# Patient Record
Sex: Female | Born: 1950 | Race: White | Hispanic: No | Smoking: Former smoker
Health system: Southern US, Community
[De-identification: ages and names within clinical notes are randomized; demographics above are authoritative.]

## PROBLEM LIST (undated history)

## (undated) DIAGNOSIS — Z9981 Dependence on supplemental oxygen: Secondary | ICD-10-CM

## (undated) DIAGNOSIS — G540 Brachial plexus disorders: Secondary | ICD-10-CM

## (undated) DIAGNOSIS — K219 Gastro-esophageal reflux disease without esophagitis: Secondary | ICD-10-CM

## (undated) DIAGNOSIS — Z8489 Family history of other specified conditions: Secondary | ICD-10-CM

## (undated) DIAGNOSIS — J439 Emphysema, unspecified: Secondary | ICD-10-CM

## (undated) DIAGNOSIS — I272 Pulmonary hypertension, unspecified: Secondary | ICD-10-CM

## (undated) DIAGNOSIS — N189 Chronic kidney disease, unspecified: Secondary | ICD-10-CM

## (undated) DIAGNOSIS — G8929 Other chronic pain: Secondary | ICD-10-CM

## (undated) DIAGNOSIS — I340 Nonrheumatic mitral (valve) insufficiency: Secondary | ICD-10-CM

## (undated) DIAGNOSIS — I509 Heart failure, unspecified: Secondary | ICD-10-CM

## (undated) DIAGNOSIS — I071 Rheumatic tricuspid insufficiency: Secondary | ICD-10-CM

## (undated) DIAGNOSIS — J9611 Chronic respiratory failure with hypoxia: Secondary | ICD-10-CM

## (undated) DIAGNOSIS — Z86711 Personal history of pulmonary embolism: Secondary | ICD-10-CM

## (undated) HISTORY — DX: Other chronic pain: G89.29

## (undated) HISTORY — PX: ABDOMINAL HYSTERECTOMY: SHX81

## (undated) HISTORY — DX: Emphysema, unspecified: J43.9

## (undated) HISTORY — DX: Chronic kidney disease, unspecified: N18.9

## (undated) HISTORY — DX: Pulmonary hypertension, unspecified: I27.20

## (undated) HISTORY — PX: THORACIC OUTLET SURGERY: SHX2502

## (undated) HISTORY — DX: Heart failure, unspecified: I50.9

## (undated) HISTORY — DX: Brachial plexus disorders: G54.0

## (undated) HISTORY — DX: Gastro-esophageal reflux disease without esophagitis: K21.9

---

## 2004-06-05 ENCOUNTER — Ambulatory Visit: Payer: Self-pay

## 2006-03-08 ENCOUNTER — Other Ambulatory Visit: Payer: Self-pay

## 2006-03-11 ENCOUNTER — Ambulatory Visit: Payer: Self-pay | Admitting: Unknown Physician Specialty

## 2006-03-22 ENCOUNTER — Observation Stay: Payer: Self-pay | Admitting: Unknown Physician Specialty

## 2006-07-04 ENCOUNTER — Ambulatory Visit: Payer: Self-pay | Admitting: Unknown Physician Specialty

## 2007-09-02 ENCOUNTER — Inpatient Hospital Stay: Payer: Self-pay | Admitting: Specialist

## 2008-09-21 ENCOUNTER — Ambulatory Visit: Payer: Self-pay | Admitting: Unknown Physician Specialty

## 2010-03-22 ENCOUNTER — Ambulatory Visit: Payer: Self-pay | Admitting: Unknown Physician Specialty

## 2011-04-30 ENCOUNTER — Ambulatory Visit: Payer: Self-pay | Admitting: Unknown Physician Specialty

## 2011-10-03 ENCOUNTER — Ambulatory Visit: Payer: Self-pay | Admitting: Unknown Physician Specialty

## 2011-10-10 ENCOUNTER — Ambulatory Visit: Payer: Self-pay | Admitting: Unknown Physician Specialty

## 2011-10-30 ENCOUNTER — Ambulatory Visit: Payer: Self-pay | Admitting: Gynecologic Oncology

## 2011-10-31 LAB — CA 125: CA 125: 37.1 U/mL — ABNORMAL HIGH (ref 0.0–34.0)

## 2011-11-26 ENCOUNTER — Ambulatory Visit: Payer: Self-pay | Admitting: Gynecologic Oncology

## 2011-12-04 ENCOUNTER — Ambulatory Visit: Payer: Self-pay | Admitting: Gynecologic Oncology

## 2011-12-04 LAB — CBC
HGB: 14.3 g/dL (ref 12.0–16.0)
MCH: 31.8 pg (ref 26.0–34.0)
Platelet: 169 10*3/uL (ref 150–440)
RBC: 4.5 10*6/uL (ref 3.80–5.20)
WBC: 6.6 10*3/uL (ref 3.6–11.0)

## 2011-12-04 LAB — BASIC METABOLIC PANEL
BUN: 18 mg/dL (ref 7–18)
Calcium, Total: 9.3 mg/dL (ref 8.5–10.1)
Glucose: 88 mg/dL (ref 65–99)
Sodium: 143 mmol/L (ref 136–145)

## 2011-12-11 ENCOUNTER — Ambulatory Visit: Payer: Self-pay | Admitting: Gynecologic Oncology

## 2011-12-27 ENCOUNTER — Ambulatory Visit: Payer: Self-pay | Admitting: Gynecologic Oncology

## 2012-02-12 ENCOUNTER — Ambulatory Visit: Payer: Self-pay | Admitting: Gynecologic Oncology

## 2012-02-26 ENCOUNTER — Ambulatory Visit: Payer: Self-pay | Admitting: Gynecologic Oncology

## 2012-05-01 ENCOUNTER — Ambulatory Visit: Payer: Self-pay | Admitting: Physician Assistant

## 2013-02-03 ENCOUNTER — Ambulatory Visit: Payer: Self-pay | Admitting: Anesthesiology

## 2013-03-06 ENCOUNTER — Ambulatory Visit: Payer: Self-pay | Admitting: Anesthesiology

## 2013-03-31 ENCOUNTER — Ambulatory Visit: Payer: Self-pay | Admitting: Anesthesiology

## 2013-04-02 ENCOUNTER — Ambulatory Visit: Payer: Self-pay | Admitting: Pain Medicine

## 2013-09-07 ENCOUNTER — Ambulatory Visit: Payer: Self-pay | Admitting: Anesthesiology

## 2013-11-05 ENCOUNTER — Ambulatory Visit: Payer: Self-pay | Admitting: Anesthesiology

## 2014-01-28 ENCOUNTER — Ambulatory Visit: Payer: Self-pay | Admitting: Anesthesiology

## 2014-05-04 ENCOUNTER — Ambulatory Visit: Payer: Self-pay | Admitting: Anesthesiology

## 2014-08-03 ENCOUNTER — Ambulatory Visit: Payer: Self-pay | Admitting: Anesthesiology

## 2014-09-07 ENCOUNTER — Ambulatory Visit
Admit: 2014-09-07 | Disposition: A | Discharge status: Home / Self Care | Payer: Self-pay | Attending: Physician Assistant | Admitting: Physician Assistant

## 2014-09-14 NOTE — Op Note (Signed)
PATIENT NAME:  Holly Werner, URIARTE MR#:  [id number] DATE OF BIRTH:  1950-10-19  DATE OF PROCEDURE:  12/11/2011  PREOPERATIVE DIAGNOSIS: Bilateral ovarian masses.   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   PROCEDURES PERFORMED: Laparoscopy for bilateral salpingo-oophorectomy, lysis of adhesions and removal of the mass from the cul-de-sac.   SURGEON: Weber Cooks, MD  ANESTHESIA: General.   COMPLICATIONS: None.   ESTIMATED BLOOD LOSS: 25 mL.   INDICATION FOR SURGERY: [name] is a 64 year old patient who was noted to have bilateral ovarian enlargement due to a solid tumor and on pelvic exam was noted to have a mass in the cul-de-sac of ovarian versus uterine origin therefore decision was made to proceed with laparoscopy.   FINDINGS AT TIME OF SURGERY: Normal inspection of the upper abdomen. Uterus small of normal form and size. Right ovary enlarged by a firm, hard tumor. Minimal adhesions. No papillations or excrescences. The left ovary is also enlarged in a similar fashion. In the cul-de-sac there is a 6 cm again solid, firm tumor with a thin adhesion to the pelvic sidewall and the sigmoid colon.   OPERATIVE REPORT: After adequate general anesthesia had been obtained, the patient was prepped and draped in lithotomy position. The cervix was visualized, grasped with a single-tooth tenaculum and dilated. A Hulka manipulator was inserted into the cervix and uterus. Then a Foley catheter was inserted.   Attention was then directed towards the abdomen. A small incision was placed in the umbilicus. The fascia was transected. The peritoneum was identified and entered. The blunt trocar was inserted. Pneumoperitoneum was obtained and inspection done with the above-mentioned findings. A small 5 mm trocar was placed in the upper left abdomen and another 5 mm trocar in the left lower abdomen.   Pelvic cytology was collected. Using the Harmonic scalpel the  pelvic sidewall was entered on the right side. Vessels and ureter were identified. The infundibulopelvic ligament was cauterized and transected. The adnexa with the tumor was then mobilized all the way towards the uterus. Tube and utero-ovarian ligament was cauterized and transected. Thus the right adnexa were completely mobilized. On the left side a similar procedure was done after adhesions around the sigmoid colon were lysed. Both ovaries were put in Endo Catch bags. Then the cul-de-sac tumor was freed from its thin adhesion to the bowel and pelvic sidewall and also placed in an Endo Catch bag. The 5 mm camera was then used to retrieve the specimens. The umbilical incision had to be enlarged slightly so that the specimen would fit through the opening. Thus both adnexa and the cul-de-sac tumor were completely removed. Inspection revealed adequate hemostasis in all areas. All trocars were then removed under direct vision. The pneumoperitoneum was expressed. The umbilical incision was closed with interrupted figure-of-eight stitches using 0 Vicryl. The subcutaneous tissue of all sites was reapproximated with 3-0 Vicryl and Dermabond was used to close the skin.   The patient tolerated the procedure well and was taken to recovery room in stable condition. Postoperative urine was clear. The catheter was removed.  Pad, sponge, needle, and instrument counts were correct x2.    ____________________________ Weber Cooks, MD bem:cms D: 12/11/2011 16:57:54 ET T: 12/11/2011 17:22:36 ET JOB#: [id number]  cc: Weber Cooks, MD, <Dictator>  Weber Cooks MD ELECTRONICALLY SIGNED 12/18/2011 17:49

## 2014-09-17 NOTE — H&P (Signed)
PATIENT NAME:  Holly Werner, Holly Werner MR#:  [id number] DATE OF BIRTH:  [birth year]  DATE OF ADMISSION:  02/03/2013  CHIEF COMPLAINT:  [name] presents for first evaluation.  She is a pleasant white female with a longstanding history of left trapezius muscle pain.  This has been present for about one month and gradually had gotten worse to a maximum VAS score of 10, best a 5, averaging about an 8 with no significant change in symptom onset.  She noted this when she picked up her grandchild.  She has some pain that seems to be worse in the afternoon and evening and after activity, aggravated by eating and movements of the left arm and head.  Alleviating factors include hot packs.  Cold seems to make this worse.  Sleeping and medication management seem to help.  She has been taking Vicodin 1 to 2 tablets 3 times a day as needed for pain relief.  She is describing the pain as burning, constant, annoying, dreadful.  It is primarily located in the posterior portion of the left shoulder with no problems with upper extremity strength or function affecting the left or right arm.  She does occasionally have some radiation of the pain into the left lateral upper extremity, but no numbness or tingling is noted.  The pain also does radiate into the left lateral neck periodically, but no neck rigidity or limitation of range of motion noted.   PAST MEDICAL HISTORY:  Significant for possible COPD and osteoarthritis.  Also significant for hyperlipidemia, nephrolithiasis, irritable bowel syndrome, reflux.  REVIEW OF SYSTEMS:  Negative for cardiovascular, neurologic, psychologic, gastrointestinal, genitourinary and hematologic.   SOCIAL HISTORY:  She is married with two children.  Smoked 1 pack per day starting at the age of 34.   CURRENT MEDICATIONS:  Valium quarter tablet as needed, acetaminophen hydrocodone 1 tablet orally q. 4 to 6 hours as needed for pain, Advil 200 mg one q. 4 hours and Robaxin as needed.   PAST SURGICAL  HISTORY:  Tonsillectomy and D and C.   PHYSICAL EXAMINATION:  GENERAL:  Reveals a pleasant white female in no acute distress.  She is alert and oriented x 3, cooperative and compliant.  HEENT:  Pupils are equally round, reactive to light.  Extraocular muscles intact.  HEART:  Regular rate and rhythm.  LUNGS:  Clear to auscultation.  VITAL SIGNS:  Blood pressure is 127/61, pulse 70, VAS 7 over 10, temperature is 97.7 with a sat of 97%.  TRAPEZIUS:  Inspection of the left trapezius reveals a trigger point in the midbody just above the scapula.  Palpation in this region does produce some radiation into the left splenius capitis region, but not to the left lateral arm.  Her strength in the upper extremities is good at 5 over 5 both proximal, distal to flexion, extension.  Sensation is intact.  Hand grasp is good.  No evidence of any deficits on examination noted.  No rashes noted.  Movement at the atlantooccipital joint is intact with no limitations except for some mild pain with extension and this pain does radiate into the left trapezius and left splenius capitis muscle.   ASSESSMENT: 1.  Myofascial left trapezius pain.  2.  Cervicalgia.  3.  Cervical facet arthropathy.   PLAN: 1.  We will perform a trigger point injection for her today.  2.  I have talked to her about physical therapy exercises and those are given to her today.  3.  Orthotic pillow.  4.  Return to clinic in 3 weeks for re-evaluation, possible repeat injection, consideration for MRI.   PROCEDURE:  The patient was placed in the seated position and the area overlying the trigger point was prepped with alcohol, injected with a 25-gauge needle.  A dose of 8 mL of ropivacaine 0.2% and 4 mg of Decadron were injected in a fanlike distribution.  This was tolerated without difficulty and the patient was convalesced and discharged to home in stable condition for follow-up as mentioned.    ____________________________ Alvina Filbert Andree Elk,  MD jga:ea D: 02/04/2013 16:50:16 ET T: 02/04/2013 17:02:09 ET JOB#: [id number]  cc: Alvina Filbert. Andree Elk, MD, <Dictator> Alvina Filbert. Andree Elk, MD Alvina Filbert Feras Gardella MD ELECTRONICALLY SIGNED 02/08/2013 11:33

## 2014-10-28 DIAGNOSIS — M542 Cervicalgia: Secondary | ICD-10-CM | POA: Insufficient documentation

## 2014-10-28 DIAGNOSIS — M7918 Myalgia, other site: Secondary | ICD-10-CM | POA: Insufficient documentation

## 2014-10-28 NOTE — Progress Notes (Signed)
Chief complaint is neck pain and shoulder pain  Procedure: Trigger Point Left Trapezius   History of present illness: Holly Werner continues to do reasonably  well with current medication management. Pain is of the same quality and characteric with  no  significant changes are  noted in baseline symptom complex. No change in lower extremity strength or function or bowel bladder function. Based on the  narcotic assessment sheet, the  patient continues to do well with this current regimen with no evidence of diverting or illicit use.  Strength is the bilateral upper extremities is without change.  There were no vitals taken for this visit.  No current outpatient prescriptions on file.  Patient Active Problem List   Diagnosis Date Noted  . Cervicalgia 10/28/2014  . Myofacial muscle pain 10/28/2014  . Neck pain 10/28/2014    Allergies not on file  Physical exam pupils are equally round and reactive to light  Extraocular muscles are intact   Heart is regular rate and rhythm and lower extremity strength and function remains a baseline with no significant changes noted.  Examination of the left trapezius region reveals 2 trigger points. He has some tenderness in the left cervical region with good range of motion and her upper chair strength is without deficit and sensation remains intact  Assessment #1 cervicalgia  #2 chronic opioid management  Plan: We'll refill medications at present with return to clinic in the next 2-3 months for reevaluation. Patient is to continue with physical therapy exercises and aerobic conditioning as tolerated and continue follow-up with her primary care physician We'll perform a trigger point injection today after mentioned 2 trigger points today and refill her medications today.  The after mentioned trigger points were each prepped with alcohol and then injected with a 25-gauge needle. I injected 5 cc of ropivacaine 0.2% with 5 mg of Decadron at each site  and this was well tolerated and she was convalesced discharged home in stable condition.  Dr. Vashti Hey 7:10 PM

## 2014-11-01 ENCOUNTER — Ambulatory Visit: Payer: Medicare Other | Attending: Anesthesiology | Admitting: Anesthesiology

## 2014-11-01 ENCOUNTER — Encounter: Payer: Self-pay | Admitting: Anesthesiology

## 2014-11-01 VITALS — BP 117/48 | HR 68 | Temp 97.7°F | Resp 18 | Ht 63.0 in | Wt 136.0 lb

## 2014-11-01 DIAGNOSIS — M791 Myalgia: Secondary | ICD-10-CM | POA: Insufficient documentation

## 2014-11-01 DIAGNOSIS — M542 Cervicalgia: Secondary | ICD-10-CM | POA: Diagnosis present

## 2014-11-01 DIAGNOSIS — F1190 Opioid use, unspecified, uncomplicated: Secondary | ICD-10-CM | POA: Insufficient documentation

## 2014-11-01 DIAGNOSIS — M25511 Pain in right shoulder: Secondary | ICD-10-CM | POA: Diagnosis present

## 2014-11-01 DIAGNOSIS — M7918 Myalgia, other site: Secondary | ICD-10-CM

## 2014-11-01 DIAGNOSIS — M25512 Pain in left shoulder: Secondary | ICD-10-CM | POA: Diagnosis present

## 2014-11-01 MED ORDER — HYDROCODONE-ACETAMINOPHEN 5-325 MG PO TABS: 1.0000 | ORAL_TABLET | Freq: Two times a day (BID) | ORAL | Status: DC

## 2014-11-01 MED ORDER — ROPIVACAINE HCL 2 MG/ML IJ SOLN
INTRAMUSCULAR | Status: AC
  Filled 2014-11-01: qty 10

## 2014-11-01 MED ORDER — DEXAMETHASONE SODIUM PHOSPHATE 10 MG/ML IJ SOLN
INTRAMUSCULAR | Status: AC
Start: 2014-11-01 — End: 2014-11-02
  Filled 2014-11-01: qty 1

## 2014-11-01 NOTE — Patient Instructions (Signed)

## 2014-11-01 NOTE — Progress Notes (Signed)
Discharged at 1510, ambulatory.

## 2014-11-02 ENCOUNTER — Telehealth: Payer: Self-pay | Admitting: *Deleted

## 2014-11-02 NOTE — Telephone Encounter (Signed)
F/u call done

## 2014-11-11 ENCOUNTER — Other Ambulatory Visit: Payer: Self-pay | Admitting: Anesthesiology

## 2015-01-10 ENCOUNTER — Ambulatory Visit: Payer: Medicare Other | Attending: Anesthesiology | Admitting: Anesthesiology

## 2015-01-10 ENCOUNTER — Encounter: Payer: Self-pay | Admitting: Anesthesiology

## 2015-01-10 VITALS — BP 118/50 | HR 69 | Temp 98.3°F | Resp 14 | Ht 63.0 in | Wt 133.0 lb

## 2015-01-10 DIAGNOSIS — M542 Cervicalgia: Secondary | ICD-10-CM | POA: Diagnosis present

## 2015-01-10 DIAGNOSIS — F1190 Opioid use, unspecified, uncomplicated: Secondary | ICD-10-CM | POA: Insufficient documentation

## 2015-01-10 DIAGNOSIS — M25519 Pain in unspecified shoulder: Secondary | ICD-10-CM | POA: Diagnosis present

## 2015-01-10 MED ORDER — HYDROCODONE-ACETAMINOPHEN 5-325 MG PO TABS: 1.0000 | ORAL_TABLET | Freq: Two times a day (BID) | ORAL | Status: DC

## 2015-01-10 NOTE — Patient Instructions (Signed)
A prescription for HYDROCODONE was given to you today.

## 2015-01-10 NOTE — Progress Notes (Signed)
Safety precautions to be maintained throughout the outpatient stay will include: orient to surroundings, keep bed in low position, maintain call bell within reach at all times, provide assistance with transfer out of bed and ambulation.  Discharged ambulatory at 2:50 pm

## 2015-01-11 NOTE — Progress Notes (Signed)
Chief complaint is neck pain and shoulder pain  History of present illness: Holly Werner continues to do reasonably  well with current medication management. Pain is of the same quality and characteric with  no  significant changes are  noted in baseline symptom complex. No change in lower extremity strength or function or bowel bladder function. Based on the  narcotic assessment sheet, the  patient continues to do well with this current regimen with no evidence of diverting or illicit use. She uses her medicine sparingly and generally no more than 1 or 2 Vicodin per day. She did very well with his last injection and he gave her almost 6 weeks of complete relief and only over the last 2 weeks that she had some recurrence of the same all the pain in the left trapezius region. No problems with upper extremity strength or function or numbness or tingling to the upper extremity is noted. She is doing some physical therapy exercises that seemed to help but do not completely alleviate pain and conservative therapy with physical therapy has been insufficient to get her complete relief  Strength is the bilateral upper extremities is without change.  BP 118/50 mmHg  Pulse 69  Temp(Src) 98.3 F (36.8 C) (Oral)  Resp 14  Ht 5\' 3"  (1.6 m)  Wt 133 lb (60.328 kg)  BMI 23.57 kg/m2  SpO2 96%   Current outpatient prescriptions:  .  docusate sodium (COLACE) 100 MG capsule, Take 100 mg by mouth daily., Disp: , Rfl:  .  HYDROcodone-acetaminophen (NORCO/VICODIN) 5-325 MG per tablet, Take 1 tablet by mouth 2 (two) times daily., Disp: 60 tablet, Rfl: 0 .  ibuprofen (ADVIL,MOTRIN) 200 MG tablet, Take 200 mg by mouth every 6 (six) hours as needed., Disp: , Rfl:  .  Methylcellulose, Laxative, (CITRUCEL PO), Take by mouth 2 (two) times daily., Disp: , Rfl:  .  pantoprazole (PROTONIX) 20 MG tablet, Take 20 mg by mouth daily., Disp: , Rfl:   Patient Active Problem List   Diagnosis Date Noted  . Cervicalgia 10/28/2014   . Myofacial muscle pain 10/28/2014  . Neck pain 10/28/2014    Allergies  Allergen Reactions  . Aspirin Other (See Comments)    Stomach hurt  . Codeine Other (See Comments)    stomach hurt    Physical exam pupils are equally round and reactive to light  Extraocular muscles are intact   Heart is regular rate and rhythm and lower extremity strength and function remains a baseline with no significant changes noted.  Examination of the left trapezius region reveals 2 trigger points. He has some tenderness in the left cervical region with good range of motion and her upper chair strength is without deficit and sensation remains intact  Assessment #1 cervicalgia  #2 chronic opioid management  Plan: We'll refill medications at present with return to clinic in the next 2-3 months for reevaluation. Patient is to continue with physical therapy exercises and aerobic conditioning as tolerated and continue follow-up with her primary care physician We'll perform a trigger point injection at her next visit in approximately 2-3 months. Dr. Vashti Hey 3:59 PM

## 2015-04-28 ENCOUNTER — Encounter: Payer: Self-pay | Admitting: Anesthesiology

## 2015-04-28 ENCOUNTER — Other Ambulatory Visit: Payer: Self-pay | Admitting: Anesthesiology

## 2015-04-28 ENCOUNTER — Ambulatory Visit: Payer: Medicare Other | Attending: Anesthesiology | Admitting: Anesthesiology

## 2015-04-28 VITALS — BP 127/54 | HR 71 | Temp 97.9°F | Resp 16 | Ht 63.0 in | Wt 136.0 lb

## 2015-04-28 DIAGNOSIS — M797 Fibromyalgia: Secondary | ICD-10-CM | POA: Diagnosis not present

## 2015-04-28 DIAGNOSIS — F1190 Opioid use, unspecified, uncomplicated: Secondary | ICD-10-CM | POA: Insufficient documentation

## 2015-04-28 DIAGNOSIS — M542 Cervicalgia: Secondary | ICD-10-CM | POA: Insufficient documentation

## 2015-04-28 DIAGNOSIS — M25519 Pain in unspecified shoulder: Secondary | ICD-10-CM | POA: Insufficient documentation

## 2015-04-28 DIAGNOSIS — M7918 Myalgia, other site: Secondary | ICD-10-CM

## 2015-04-28 MED ORDER — HYDROCODONE-ACETAMINOPHEN 5-325 MG PO TABS: 1.0000 | ORAL_TABLET | Freq: Two times a day (BID) | ORAL | Status: DC

## 2015-04-29 NOTE — Progress Notes (Signed)
Chief complaint is neck pain and shoulder pain  History of present illness: Holly Werner continues to do reasonably  well with current medication management. Pain is of the same quality and characteric with  no  significant changes are  noted in baseline symptom complex. She still has some intermittent severe pain in the left posterior trapezius muscle with radiation into the back of the shoulder blade and also affecting the posterior neck. She does not have any change in upper extremity strength or function and does not report any numbness or tingling. She is doing exercises as shown to me today for stretching and to assist with strengthening. She uses her Vicodin sparingly at about a half tablet up to 4 times a day.   Strength is the bilateral upper extremities is without change. She does have significant tenderness with compression of the left posterior trapezius which has been present on previous exam. Her strength but bicep tricep grip and wrist is symmetric and good rated at 5 over 5 throughout the upper extremities. Sensation is also intact to light touch and pulses are good and capillary refill is good as well.  BP 127/54 mmHg  Pulse 71  Temp(Src) 97.9 F (36.6 C) (Oral)  Resp 16  Ht 5\' 3"  (1.6 m)  Wt 136 lb (61.689 kg)  BMI 24.10 kg/m2  SpO2 95%   Current outpatient prescriptions:  .  docusate sodium (COLACE) 100 MG capsule, Take 100 mg by mouth daily., Disp: , Rfl:  .  HYDROcodone-acetaminophen (NORCO/VICODIN) 5-325 MG tablet, Take 1 tablet by mouth 2 (two) times daily., Disp: 60 tablet, Rfl: 0 .  ibuprofen (ADVIL,MOTRIN) 200 MG tablet, Take 200 mg by mouth every 6 (six) hours as needed., Disp: , Rfl:  .  Methylcellulose, Laxative, (CITRUCEL PO), Take by mouth 2 (two) times daily., Disp: , Rfl:  .  pantoprazole (PROTONIX) 20 MG tablet, Take 20 mg by mouth daily., Disp: , Rfl:   Patient Active Problem List   Diagnosis Date Noted  . Cervicalgia 10/28/2014  . Myofacial muscle pain  10/28/2014  . Neck pain 10/28/2014    Allergies  Allergen Reactions  . Aspirin Other (See Comments)    Stomach hurt  . Codeine Other (See Comments)    stomach hurt    Assessment #1 cervicalgia  #2 chronic opioid management At this point I think it is reasonable for her to continue on her current medication regimen. It seems to be working well for her. She has been compliant and I have encouraged her to reduce dosing to only 5 of 7 days per week. Also to add back and anti-inflammatory and/or muscle relaxant if needed to assist. I have cautioned her about potential side effects with long-term use of opioid medications and she voices understanding but states that nothing else seems to keep her pain under control. I do not believe further diagnostic studies are in order based on her physical examination. She is to return to clinic in 2 months. Also follow-up with her primary care physician for baseline medical management.   Dr. Vashti Hey 3:40 PM

## 2015-05-05 LAB — TOXASSURE SELECT 13 (MW), URINE

## 2015-06-22 ENCOUNTER — Encounter: Payer: Self-pay | Admitting: Anesthesiology

## 2015-06-22 ENCOUNTER — Ambulatory Visit: Payer: Medicare Other | Attending: Anesthesiology | Admitting: Anesthesiology

## 2015-06-22 VITALS — BP 135/60 | HR 69 | Temp 98.2°F | Resp 16 | Ht 63.0 in | Wt 134.0 lb

## 2015-06-22 DIAGNOSIS — M791 Myalgia: Secondary | ICD-10-CM | POA: Diagnosis not present

## 2015-06-22 DIAGNOSIS — M797 Fibromyalgia: Secondary | ICD-10-CM | POA: Diagnosis not present

## 2015-06-22 DIAGNOSIS — F1190 Opioid use, unspecified, uncomplicated: Secondary | ICD-10-CM | POA: Diagnosis not present

## 2015-06-22 DIAGNOSIS — M7918 Myalgia, other site: Secondary | ICD-10-CM

## 2015-06-22 DIAGNOSIS — M25519 Pain in unspecified shoulder: Secondary | ICD-10-CM | POA: Diagnosis present

## 2015-06-22 DIAGNOSIS — M542 Cervicalgia: Secondary | ICD-10-CM | POA: Insufficient documentation

## 2015-06-22 MED ORDER — HYDROCODONE-ACETAMINOPHEN 5-325 MG PO TABS
1.0000 | ORAL_TABLET | Freq: Two times a day (BID) | ORAL | Status: DC
Start: 2015-06-22 — End: 2015-08-18

## 2015-06-22 MED ORDER — HYDROCODONE-ACETAMINOPHEN 5-325 MG PO TABS: 1.0000 | ORAL_TABLET | Freq: Two times a day (BID) | ORAL | Status: DC

## 2015-06-22 NOTE — Patient Instructions (Signed)
You were given 2 prescriptions for Hydrocodone today. 

## 2015-06-22 NOTE — Progress Notes (Signed)
Safety precautions to be maintained throughout the outpatient stay will include: orient to surroundings, keep bed in low position, maintain call bell within reach at all times, provide assistance with transfer out of bed and ambulation.  

## 2015-06-23 NOTE — Progress Notes (Signed)
Chief complaint is neck pain and shoulder pain  History of present illness: Holly Werner is here for reevaluation today. She was seen approximately 2 months ago and continues to have significant pain involving the left posterior neck and left trapezius muscle. This occasionally radiates into some diffuse arm pain and pain radiating into all fingers of the left hand. She denies associated numbness and tingling at this time and reports no motor weakness affecting the left side. Despite efforts at stretching strengthening and physical therapy modalities she's failed to gain any significant improvement in this condition. She has had previous x-rays that show evidence of diffuse degenerative joint disease and some bony spurring involving C4-C5 and C6 on plain film x-ray. This condition has remained stable other than some recent exacerbation of the pain. She has had trigger point injections in the left trapezius muscle that have given her relief in the past..   Strength is the bilateral upper extremities is without change. She does have significant tenderness with compression of the left posterior trapezius which has been present on previous exam. Her strength but bicep tricep grip and wrist is symmetric and good rated at 5 over 5 throughout the upper extremities. Sensation is also intact to light touch and pulses are good and capillary refill is good as well.  BP 135/60 mmHg  Pulse 69  Temp(Src) 98.2 F (36.8 C) (Oral)  Resp 16  Ht 5\' 3"  (1.6 m)  Wt 134 lb (60.782 kg)  BMI 23.74 kg/m2  SpO2 93%   Current outpatient prescriptions:  .  diphenhydrAMINE (SOMINEX) 25 MG tablet, Take 25 mg by mouth at bedtime as needed for sleep., Disp: , Rfl:  .  docusate sodium (COLACE) 100 MG capsule, Take 100 mg by mouth daily., Disp: , Rfl:  .  HYDROcodone-acetaminophen (NORCO/VICODIN) 5-325 MG tablet, Take 1 tablet by mouth 2 (two) times daily., Disp: 60 tablet, Rfl: 0 .  ibuprofen (ADVIL,MOTRIN) 200 MG tablet, Take  200 mg by mouth every 6 (six) hours as needed., Disp: , Rfl:  .  pantoprazole (PROTONIX) 20 MG tablet, Take 20 mg by mouth daily., Disp: , Rfl:  .  Methylcellulose, Laxative, (CITRUCEL PO), Take by mouth 2 (two) times daily. Reported on 06/22/2015, Disp: , Rfl:   Patient Active Problem List   Diagnosis Date Noted  . Cervicalgia 10/28/2014  . Myofacial muscle pain 10/28/2014  . Neck pain 10/28/2014    Allergies  Allergen Reactions  . Aspirin Other (See Comments)    Stomach hurt  . Codeine Other (See Comments)    stomach hurt    Assessment #1 cervicalgia  #2 chronic opioid management At this point I think it is reasonable for her to continue on her current medication regimen. It seems to be working well for her. I have encouraged her to add in Naprosyn 220 mg tablets 1 or 2 tablets twice a day when necessary for pain and to use this 5 days of the week. We talked about potential risks associated with NSAIDs and hopefully she can reduce the amount of opioids she is taking. She has been compliant and I have encouraged her to reduce dosing to only 5 of 7 days per week. Also to add back a muscle relaxant if needed to assist. I have cautioned her about potential side effects with long-term use of opioid medications and she voices understanding but states that nothing else seems to keep her pain under control. I do not believe further diagnostic studies are in order based  on her physical examination. She is to return to clinic in 2 months. Also follow-up with her primary care physician for baseline medical management.   Dr. Vashti Hey 9:54 AM

## 2015-08-18 ENCOUNTER — Ambulatory Visit: Payer: Medicare Other | Attending: Anesthesiology | Admitting: Anesthesiology

## 2015-08-18 ENCOUNTER — Encounter: Payer: Self-pay | Admitting: Anesthesiology

## 2015-08-18 VITALS — BP 115/52 | HR 73 | Temp 97.8°F | Resp 18 | Ht 63.0 in | Wt 136.0 lb

## 2015-08-18 DIAGNOSIS — M542 Cervicalgia: Secondary | ICD-10-CM | POA: Diagnosis present

## 2015-08-18 DIAGNOSIS — M791 Myalgia: Secondary | ICD-10-CM | POA: Diagnosis not present

## 2015-08-18 DIAGNOSIS — M797 Fibromyalgia: Secondary | ICD-10-CM

## 2015-08-18 DIAGNOSIS — M778 Other enthesopathies, not elsewhere classified: Secondary | ICD-10-CM | POA: Diagnosis not present

## 2015-08-18 DIAGNOSIS — M25519 Pain in unspecified shoulder: Secondary | ICD-10-CM | POA: Diagnosis present

## 2015-08-18 DIAGNOSIS — M7918 Myalgia, other site: Secondary | ICD-10-CM

## 2015-08-18 DIAGNOSIS — F1120 Opioid dependence, uncomplicated: Secondary | ICD-10-CM | POA: Insufficient documentation

## 2015-08-18 MED ORDER — HYDROCODONE-ACETAMINOPHEN 5-325 MG PO TABS: 1.0000 | ORAL_TABLET | Freq: Two times a day (BID) | ORAL | Status: DC

## 2015-08-18 NOTE — Progress Notes (Signed)
Safety precautions to be maintained throughout the outpatient stay will include: orient to surroundings, keep bed in low position, maintain call bell within reach at all times, provide assistance with transfer out of bed and ambulation.  

## 2015-08-18 NOTE — Patient Instructions (Addendum)

## 2015-08-18 NOTE — Progress Notes (Signed)
Chief complaint is neck pain and shoulder pain  History of present illness: Holly Werner is here for reevaluation today. She was seen approximately 2 months ago and continues to have significant pain involving the left posterior neck and left trapezius muscle. This occasionally radiates into some diffuse arm pain and pain radiating into all fingers of the left hand. She denies associated numbness and tingling at this time and reports no motor weakness affecting the left side. Despite efforts at stretching strengthening and physical therapy modalities she's failed to gain any significant improvement in this condition. She has had previous x-rays that show evidence of diffuse degenerative joint disease and some bony spurring involving C4-C5 and C6 on plain film x-ray. She has been doing reasonably well with physical therapy stretching but continues to have breakthrough pain for which she takes hydrocodone 1-2 tablets a day. Based on her narcotic assessment sheet this continues to work well for her.  Physical exam:  Strength in the bilateral upper extremities is without change. She does have significant tenderness with compression of the left posterior trapezius which has been present on previous exam. Her strength but bicep tricep grip and wrist is symmetric and good rated at 5 over 5 throughout the upper extremities. Sensation is also intact to light touch and pulses are good and capillary refill is good as well.  BP 115/52 mmHg  Pulse 73  Temp(Src) 97.8 F (36.6 C) (Oral)  Resp 18  Ht 5\' 3"  (1.6 m)  Wt 136 lb (61.689 kg)  BMI 24.10 kg/m2  SpO2 91%   Current outpatient prescriptions:  .  docusate sodium (COLACE) 100 MG capsule, Take 100 mg by mouth daily., Disp: , Rfl:  .  HYDROcodone-acetaminophen (NORCO/VICODIN) 5-325 MG tablet, Take 1 tablet by mouth 2 (two) times daily., Disp: 60 tablet, Rfl: 0 .  ibuprofen (ADVIL,MOTRIN) 200 MG tablet, Take 200 mg by mouth every 6 (six) hours as needed.,  Disp: , Rfl:  .  pantoprazole (PROTONIX) 20 MG tablet, Take 20 mg by mouth daily., Disp: , Rfl:  .  diphenhydrAMINE (SOMINEX) 25 MG tablet, Take 25 mg by mouth at bedtime as needed for sleep. Reported on 08/18/2015, Disp: , Rfl:  .  Methylcellulose, Laxative, (CITRUCEL PO), Take by mouth 2 (two) times daily. Reported on 08/18/2015, Disp: , Rfl:   Patient Active Problem List   Diagnosis Date Noted  . Cervicalgia 10/28/2014  . Myofacial muscle pain 10/28/2014  . Neck pain 10/28/2014    Allergies  Allergen Reactions  . Aspirin Other (See Comments)    Stomach hurt  . Codeine Other (See Comments)    stomach hurt    Assessment #1 cervicalgia  #2 chronic opioid management At this point I think it is reasonable for her to continue on her current medication regimen. It seems to be working well for her. I have encouraged her to add in Naprosyn 220 mg tablets 1 or 2 tablets twice a day when necessary for pain and to use this 5 days of the week. We talked about potential risks associated with NSAIDs and hopefully she can reduce the amount of opioids she is taking. She has been compliant and I have encouraged her to reduce dosing to only 5 of 7 days per week. Also to add back a muscle relaxant if needed to assist. I have cautioned her about potential side effects with long-term use of opioid medications and she voices understanding but states that nothing else seems to keep her pain under control.  I do not believe further diagnostic studies are in order based on her physical examination. She is to return to clinic in 2 months in which we will schedule her for a trigger point injection.  Also follow-up with her primary care physician for baseline medical management.   Dr. Vashti Hey 3:44 PM

## 2015-08-26 LAB — TOXASSURE SELECT 13 (MW), URINE: PDF: 0

## 2015-10-13 ENCOUNTER — Encounter: Payer: Self-pay | Admitting: Anesthesiology

## 2015-10-13 ENCOUNTER — Ambulatory Visit: Payer: Medicare Other | Attending: Anesthesiology | Admitting: Anesthesiology

## 2015-10-13 VITALS — BP 117/60 | HR 65 | Temp 98.0°F | Resp 16 | Ht 63.0 in | Wt 134.0 lb

## 2015-10-13 DIAGNOSIS — M7918 Myalgia, other site: Secondary | ICD-10-CM

## 2015-10-13 DIAGNOSIS — M797 Fibromyalgia: Secondary | ICD-10-CM | POA: Diagnosis not present

## 2015-10-13 DIAGNOSIS — M542 Cervicalgia: Secondary | ICD-10-CM | POA: Insufficient documentation

## 2015-10-13 DIAGNOSIS — F1120 Opioid dependence, uncomplicated: Secondary | ICD-10-CM | POA: Insufficient documentation

## 2015-10-13 DIAGNOSIS — M791 Myalgia: Secondary | ICD-10-CM | POA: Insufficient documentation

## 2015-10-13 DIAGNOSIS — M25519 Pain in unspecified shoulder: Secondary | ICD-10-CM | POA: Diagnosis present

## 2015-10-13 DIAGNOSIS — M5030 Other cervical disc degeneration, unspecified cervical region: Secondary | ICD-10-CM | POA: Insufficient documentation

## 2015-10-13 MED ORDER — HYDROCODONE-ACETAMINOPHEN 5-325 MG PO TABS: 1.0000 | ORAL_TABLET | Freq: Two times a day (BID) | ORAL | Status: DC

## 2015-10-13 NOTE — Patient Instructions (Signed)
Pain Management Discharge Instructions  General Discharge Instructions :  If you need to reach your doctor call: Monday-Friday 8:00 am - 4:00 pm at [phone] or toll free [phone].  After clinic hours [phone] to have operator reach doctor.  Bring all of your medication bottles to all your appointments in the pain clinic.  To cancel or reschedule your appointment with Pain Management please remember to call 24 hours in advance to avoid a fee.  Refer to the educational materials which you have been given on: General Risks, I had my Procedure. Discharge Instructions, Post Sedation.  Post Procedure Instructions:  The drugs you were given will stay in your system until tomorrow, so for the next 24 hours you should not drive, make any legal decisions or drink any alcoholic beverages.  You may eat anything you prefer, but it is better to start with liquids then soups and crackers, and gradually work up to solid foods.  Please notify your doctor immediately if you have any unusual bleeding, trouble breathing or pain that is not related to your normal pain.  Depending on the type of procedure that was done, some parts of your body may feel week and/or numb.  This usually clears up by tonight or the next day.  Walk with the use of an assistive device or accompanied by an adult for the 24 hours.  You may use ice on the affected area for the first 24 hours.  Put ice in a Ziploc bag and cover with a towel and place against area 15 minutes on 15 minutes off.  You may switch to heat after 24 hours.Trigger Point Injection Trigger points are areas where you have muscle pain. A trigger point injection is a shot given in the trigger point to relieve that pain. A trigger point might feel like a knot in your muscle. It hurts to press on a trigger point. Sometimes the pain spreads out (radiates) to other parts of the body. For example, pressing on a trigger point in your shoulder might cause pain in  your arm or neck. You might have one trigger point. Or, you might have more than one. People often have trigger points in their upper back and lower back. They also occur often in the neck and shoulders. Pain from a trigger point lasts for a long time. It can make it hard to keep moving. You might not be able to do the exercise or physical therapy that could help you deal with the pain. A trigger point injection may help. It does not work for everyone. But, it may relieve your pain for a few days or a few months. A trigger point injection does not cure long-lasting (chronic) pain. LET YOUR CAREGIVER KNOW ABOUT:  Any allergies (especially to latex, lidocaine, or steroids).  Blood-thinning medicines that you take. These drugs can lead to bleeding or bruising after an injection. They include:  Aspirin.  Ibuprofen.  Clopidogrel.  Warfarin.  Other medicines you take. This includes all vitamins, herbs, eyedrops, over-the-counter medicines, and creams.  Use of steroids.  Recent infections.  Past problems with numbing medicines.  Bleeding problems.  Surgeries you have had.  Other health problems. RISKS AND COMPLICATIONS A trigger point injection is a safe treatment. However, problems may develop, such as:  Minor side effects usually go away in 1 to 2 days. These may include:  Soreness.  Bruising.  Stiffness.  More serious problems are rare. But, they may include:  Bleeding under the skin (hematoma).  Skin infection.  Breaking off of the needle under your skin.  Lung puncture.  The trigger point injection may not work for you. BEFORE THE PROCEDURE You may need to stop taking any medicine that thins your blood. This is to prevent bleeding and bruising. Usually these medicines are stopped several days before the injection. No other preparation is needed. PROCEDURE  A trigger point injection can be given in your caregiver's office or in a clinic. Each injection takes 2  minutes or less.  Your caregiver will feel for trigger points. The caregiver may use a marker to circle the area for the injection.  The skin over the trigger point will be washed with a germ-killing (antiseptic) solution.  The caregiver pinches the spot for the injection.  Then, a very thin needle is used for the shot. You may feel pain or a twitching feeling when the needle enters the trigger point.  A numbing solution may be injected into the trigger point. Sometimes a drug to keep down swelling, redness, and warmth (inflammation) is also injected.  Your caregiver moves the needle around the trigger zone until the tightness and twitching goes away.  After the injection, your caregiver may put gentle pressure over the injection site.  Then it is covered with a bandage. AFTER THE PROCEDURE  You can go right home after the injection.  The bandage can be taken off after a few hours.  You may feel sore and stiff for 1 to 2 days.  Go back to your regular activities slowly. Your caregiver may ask you to stretch your muscles. Do not do anything that takes extra energy for a few days.  Follow your caregiver's instructions to manage and treat other pain.   This information is not intended to replace advice given to you by your health care provider. Make sure you discuss any questions you have with your health care provider.   Document Released: 05/03/2011 Document Revised: 09/08/2012 Document Reviewed: 05/03/2011 Elsevier Interactive Patient Education 2016 [place]  What are the risk, side effects and possible complications? Generally speaking, most procedures are safe.  However, with any procedure there are risks, side effects, and the possibility of complications.  The risks and complications are dependent upon the sites that are lesioned, or the type of nerve block to be performed.  The closer the procedure is to the spine, the more serious the  risks are.  Great care is taken when placing the radio frequency needles, block needles or lesioning probes, but sometimes complications can occur.  Infection: Any time there is an injection through the skin, there is a risk of infection.  This is why sterile conditions are used for these blocks.  There are four possible types of infection.  Localized skin infection.  Central Nervous System Infection-This can be in the form of Meningitis, which can be deadly.  Epidural Infections-This can be in the form of an epidural abscess, which can cause pressure inside of the spine, causing compression of the spinal cord with subsequent paralysis. This would require an emergency surgery to decompress, and there are no guarantees that the patient would recover from the paralysis.  Discitis-This is an infection of the intervertebral discs.  It occurs in about 1% of discography procedures.  It is difficult to treat and it may lead to surgery.        2. Pain: the needles have to go through skin and soft tissues, will cause soreness.  3. Damage to internal structures:  The nerves to be lesioned may be near blood vessels or    other nerves which can be potentially damaged.       4. Bleeding: Bleeding is more common if the patient is taking blood thinners such as  aspirin, Coumadin, Ticiid, Plavix, etc., or if he/she have some genetic predisposition  such as hemophilia. Bleeding into the spinal canal can cause compression of the spinal  cord with subsequent paralysis.  This would require an emergency surgery to  decompress and there are no guarantees that the patient would recover from the  paralysis.       5. Pneumothorax:  Puncturing of a lung is a possibility, every time a needle is introduced in  the area of the chest or upper back.  Pneumothorax refers to free air around the  collapsed lung(s), inside of the thoracic cavity (chest cavity).  Another two possible  complications related to a similar event would  include: Hemothorax and Chylothorax.   These are variations of the Pneumothorax, where instead of air around the collapsed  lung(s), you may have blood or chyle, respectively.       6. Spinal headaches: They may occur with any procedures in the area of the spine.       7. Persistent CSF (Cerebro-Spinal Fluid) leakage: This is a rare problem, but may occur  with prolonged intrathecal or epidural catheters either due to the formation of a fistulous  track or a dural tear.       8. Nerve damage: By working so close to the spinal cord, there is always a possibility of  nerve damage, which could be as serious as a permanent spinal cord injury with  paralysis.       9. Death:  Although rare, severe deadly allergic reactions known as "Anaphylactic  reaction" can occur to any of the medications used.      10. Worsening of the symptoms:  We can always make thing worse.  What are the chances of something like this happening? Chances of any of this occuring are extremely low.  By statistics, you have more of a chance of getting killed in a motor vehicle accident: while driving to the hospital than any of the above occurring .  Nevertheless, you should be aware that they are possibilities.  In general, it is similar to taking a shower.  Everybody knows that you can slip, hit your head and get killed.  Does that mean that you should not shower again?  Nevertheless always keep in mind that statistics do not mean anything if you happen to be on the wrong side of them.  Even if a procedure has a 1 (one) in a 1,000,000 (million) chance of going wrong, it you happen to be that one..Also, keep in mind that by statistics, you have more of a chance of having something go wrong when taking medications.  Who should not have this procedure? If you are on a blood thinning medication (e.g. Coumadin, Plavix, see list of "Blood Thinners"), or if you have an active infection going on, you should not have the procedure.  If you are  taking any blood thinners, please inform your physician.  How should I prepare for this procedure?  Do not eat or drink anything at least six hours prior to the procedure.  Bring a driver with you .  It cannot be a taxi.  Come accompanied by an adult that can drive you back, and  that is strong enough to help you if your legs get weak or numb from the local anesthetic.  Take all of your medicines the morning of the procedure with just enough water to swallow them.  If you have diabetes, make sure that you are scheduled to have your procedure done first thing in the morning, whenever possible.  If you have diabetes, take only half of your insulin dose and notify our nurse that you have done so as soon as you arrive at the clinic.  If you are diabetic, but only take blood sugar pills (oral hypoglycemic), then do not take them on the morning of your procedure.  You may take them after you have had the procedure.  Do not take aspirin or any aspirin-containing medications, at least eleven (11) days prior to the procedure.  They may prolong bleeding.  Wear loose fitting clothing that may be easy to take off and that you would not mind if it got stained with Betadine or blood.  Do not wear any jewelry or perfume  Remove any nail coloring.  It will interfere with some of our monitoring equipment.  NOTE: Remember that this is not meant to be interpreted as a complete list of all possible complications.  Unforeseen problems may occur.  BLOOD THINNERS The following drugs contain aspirin or other products, which can cause increased bleeding during surgery and should not be taken for 2 weeks prior to and 1 week after surgery.  If you should need take something for relief of minor pain, you may take acetaminophen which is found in Tylenol,m Datril, Anacin-3 and Panadol. It is not blood thinner. The products listed below are.  Do not take any of the products listed below in addition to any listed on  your instruction sheet.  A.P.C or A.P.C with Codeine Codeine Phosphate Capsules #3 Ibuprofen Ridaura  ABC compound Congesprin Imuran rimadil  Advil Cope Indocin Robaxisal  Alka-Seltzer Effervescent Pain Reliever and Antacid Coricidin or Coricidin-D  Indomethacin Rufen  Alka-Seltzer plus Cold Medicine Cosprin Ketoprofen S-A-C Tablets  Anacin Analgesic Tablets or Capsules Coumadin Korlgesic Salflex  Anacin Extra Strength Analgesic tablets or capsules CP-2 Tablets Lanoril Salicylate  Anaprox Cuprimine Capsules Levenox Salocol  Anexsia-D Dalteparin Magan Salsalate  Anodynos Darvon compound Magnesium Salicylate Sine-off  Ansaid Dasin Capsules Magsal Sodium Salicylate  Anturane Depen Capsules Marnal Soma  APF Arthritis pain formula Dewitt's Pills Measurin Stanback  Argesic Dia-Gesic Meclofenamic Sulfinpyrazone  Arthritis Bayer Timed Release Aspirin Diclofenac Meclomen Sulindac  Arthritis pain formula Anacin Dicumarol Medipren Supac  Analgesic (Safety coated) Arthralgen Diffunasal Mefanamic Suprofen  Arthritis Strength Bufferin Dihydrocodeine Mepro Compound Suprol  Arthropan liquid Dopirydamole Methcarbomol with Aspirin Synalgos  ASA tablets/Enseals Disalcid Micrainin Tagament  Ascriptin Doan's Midol Talwin  Ascriptin A/D Dolene Mobidin Tanderil  Ascriptin Extra Strength Dolobid Moblgesic Ticlid  Ascriptin with Codeine Doloprin or Doloprin with Codeine Momentum Tolectin  Asperbuf Duoprin Mono-gesic Trendar  Aspergum Duradyne Motrin or Motrin IB Triminicin  Aspirin plain, buffered or enteric coated Durasal Myochrisine Trigesic  Aspirin Suppositories Easprin Nalfon Trillsate  Aspirin with Codeine Ecotrin Regular or Extra Strength Naprosyn Uracel  Atromid-S Efficin Naproxen Ursinus  Auranofin Capsules Elmiron Neocylate Vanquish  Axotal Emagrin Norgesic Verin  Azathioprine Empirin or Empirin with Codeine Normiflo Vitamin E  Azolid Emprazil Nuprin Voltaren  Bayer Aspirin plain, buffered or  children's or timed BC Tablets or powders Encaprin Orgaran Warfarin Sodium  Buff-a-Comp Enoxaparin Orudis Zorpin  Buff-a-Comp with Codeine Equegesic Os-Cal-Gesic   Buffaprin Excedrin plain, buffered  or Extra Strength Oxalid   Bufferin Arthritis Strength Feldene Oxphenbutazone   Bufferin plain or Extra Strength Feldene Capsules Oxycodone with Aspirin   Bufferin with Codeine Fenoprofen Fenoprofen Pabalate or Pabalate-SF   Buffets II Flogesic Panagesic   Buffinol plain or Extra Strength Florinal or Florinal with Codeine Panwarfarin   Buf-Tabs Flurbiprofen Penicillamine   Butalbital Compound Four-way cold tablets Penicillin   Butazolidin Fragmin Pepto-Bismol   Carbenicillin Geminisyn Percodan   Carna Arthritis Reliever Geopen Persantine   Carprofen Gold's salt Persistin   Chloramphenicol Goody's Phenylbutazone   Chloromycetin Haltrain Piroxlcam   Clmetidine heparin Plaquenil   Cllnoril Hyco-pap Ponstel   Clofibrate Hydroxy chloroquine Propoxyphen         Before stopping any of these medications, be sure to consult the physician who ordered them.  Some, such as Coumadin (Warfarin) are ordered to prevent or treat serious conditions such as "deep thrombosis", "pumonary embolisms", and other heart problems.  The amount of time that you may need off of the medication may also vary with the medication and the reason for which you were taking it.  If you are taking any of these medications, please make sure you notify your pain physician before you undergo any procedures.

## 2015-10-13 NOTE — Progress Notes (Signed)
Safety precautions to be maintained throughout the outpatient stay will include: orient to surroundings, keep bed in low position, maintain call bell within reach at all times, provide assistance with transfer out of bed and ambulation.  

## 2015-10-17 NOTE — Progress Notes (Signed)
Chief complaint is neck pain and shoulder pain  History of present illness: Holly Werner is here for reevaluation today. She was seen around 2 months ago and continues to have significant pain involving the left posterior neck and left trapezius muscle. This occasionally radiates into some diffuse arm pain and pain radiating into all fingers of the left hand. She denies associated numbness and tingling at this time and reports no motor weakness affecting the left side. Despite efforts at stretching strengthening and physical therapy modalities she's failed to gain any significant improvement in this condition. She has had previous x-rays that show evidence of diffuse degenerative joint disease and some bony spurring involving C4-C5 and C6 on plain film x-ray.   Today she reports that she has been doing reasonably well with physical therapy stretching but continues to have breakthrough pain for which she takes hydrocodone 1-2 tablets a day. Based on her narcotic assessment sheet this continues to work well for her.  Physical exam:  Strength in the bilateral upper extremities is without change. She does have significant tenderness with compression of the left posterior trapezius which has been present on previous exam. Her strength but bicep tricep grip and wrist is symmetric and good rated at 5 over 5 throughout the upper extremities. Sensation is also intact to light touch and pulses are good and capillary refill is good as well.  BP 117/60 mmHg  Pulse 65  Temp(Src) 98 F (36.7 C) (Oral)  Resp 16  Ht 5\' 3"  (1.6 m)  Wt 134 lb (60.782 kg)  BMI 23.74 kg/m2  SpO2 97%   Current outpatient prescriptions:  .  docusate sodium (COLACE) 100 MG capsule, Take 100 mg by mouth daily., Disp: , Rfl:  .  HYDROcodone-acetaminophen (NORCO/VICODIN) 5-325 MG tablet, Take 1 tablet by mouth 2 (two) times daily., Disp: 60 tablet, Rfl: 0 .  ibuprofen (ADVIL,MOTRIN) 200 MG tablet, Take 200 mg by mouth every 6 (six)  hours as needed., Disp: , Rfl:  .  Methylcellulose, Laxative, (CITRUCEL PO), Take by mouth 2 (two) times daily. Reported on 08/18/2015, Disp: , Rfl:  .  pantoprazole (PROTONIX) 20 MG tablet, Take 20 mg by mouth daily., Disp: , Rfl:  .  diphenhydrAMINE (SOMINEX) 25 MG tablet, Take 25 mg by mouth at bedtime as needed for sleep. Reported on 10/13/2015, Disp: , Rfl:   Patient Active Problem List   Diagnosis Date Noted  . Cervicalgia 10/28/2014  . Myofacial muscle pain 10/28/2014  . Neck pain 10/28/2014    Allergies  Allergen Reactions  . Aspirin Other (See Comments)    Stomach hurt  . Codeine Other (See Comments)    stomach hurt    Assessment #1 cervicalgia  #2 chronic opioid management At this point I think it is reasonable for her to continue on her current medication regimen. It seems to be working well for her. I have encouraged her to add in Naprosyn 220 mg tablets 1 or 2 tablets twice a day when necessary for pain and to use this 5 days of the week. We talked about potential risks associated with NSAIDs and hopefully she can reduce the amount of opioids she is taking. She has been compliant and I have encouraged her to reduce dosing to only 5 of 7 days per week. Also to add back a muscle relaxant if needed to assist. I have cautioned her about potential side effects with long-term use of opioid medications and she voices understanding but states that nothing else seems  to keep her pain under control. I do not believe further diagnostic studies are in order based on her physical examination. She is to return to clinic in 2 months in which we will schedule her for a trigger point injection.  Also follow-up with her primary care physician for baseline medical management.   Dr. Vashti Hey 9:25 AM

## 2015-12-13 ENCOUNTER — Ambulatory Visit: Payer: Medicare Other | Attending: Anesthesiology | Admitting: Anesthesiology

## 2015-12-13 ENCOUNTER — Encounter: Payer: Self-pay | Admitting: Anesthesiology

## 2015-12-13 VITALS — BP 111/47 | HR 72 | Temp 98.2°F | Resp 18 | Ht 63.0 in | Wt 130.0 lb

## 2015-12-13 DIAGNOSIS — M5030 Other cervical disc degeneration, unspecified cervical region: Secondary | ICD-10-CM | POA: Diagnosis not present

## 2015-12-13 DIAGNOSIS — Z79891 Long term (current) use of opiate analgesic: Secondary | ICD-10-CM | POA: Diagnosis not present

## 2015-12-13 DIAGNOSIS — M797 Fibromyalgia: Secondary | ICD-10-CM

## 2015-12-13 DIAGNOSIS — M25519 Pain in unspecified shoulder: Secondary | ICD-10-CM | POA: Diagnosis present

## 2015-12-13 DIAGNOSIS — M778 Other enthesopathies, not elsewhere classified: Secondary | ICD-10-CM | POA: Diagnosis not present

## 2015-12-13 DIAGNOSIS — M7918 Myalgia, other site: Secondary | ICD-10-CM

## 2015-12-13 DIAGNOSIS — M542 Cervicalgia: Secondary | ICD-10-CM

## 2015-12-13 MED ORDER — HYDROCODONE-ACETAMINOPHEN 5-325 MG PO TABS: 1.0000 | ORAL_TABLET | Freq: Two times a day (BID) | ORAL | Status: DC

## 2015-12-13 MED ORDER — CELECOXIB 200 MG PO CAPS: 200.0000 mg | ORAL_CAPSULE | Freq: Two times a day (BID) | ORAL | Status: DC

## 2015-12-13 NOTE — Patient Instructions (Signed)
Trigger Point Injection Trigger points are areas where you have muscle pain. A trigger point injection is a shot given in the trigger point to relieve that pain. A trigger point might feel like a knot in your muscle. It hurts to press on a trigger point. Sometimes the pain spreads out (radiates) to other parts of the body. For example, pressing on a trigger point in your shoulder might cause pain in your arm or neck. You might have one trigger point. Or, you might have more than one. People often have trigger points in their upper back and lower back. They also occur often in the neck and shoulders. Pain from a trigger point lasts for a long time. It can make it hard to keep moving. You might not be able to do the exercise or physical therapy that could help you deal with the pain. A trigger point injection may help. It does not work for everyone. But, it may relieve your pain for a few days or a few months. A trigger point injection does not cure long-lasting (chronic) pain. LET YOUR CAREGIVER KNOW ABOUT:  Any allergies (especially to latex, lidocaine, or steroids).  Blood-thinning medicines that you take. These drugs can lead to bleeding or bruising after an injection. They include:  Aspirin.  Ibuprofen.  Clopidogrel.  Warfarin.  Other medicines you take. This includes all vitamins, herbs, eyedrops, over-the-counter medicines, and creams.  Use of steroids.  Recent infections.  Past problems with numbing medicines.  Bleeding problems.  Surgeries you have had.  Other health problems. RISKS AND COMPLICATIONS A trigger point injection is a safe treatment. However, problems may develop, such as:  Minor side effects usually go away in 1 to 2 days. These may include:  Soreness.  Bruising.  Stiffness.  More serious problems are rare. But, they may include:  Bleeding under the skin (hematoma).  Skin infection.  Breaking off of the needle under your skin.  Lung  puncture.  The trigger point injection may not work for you. BEFORE THE PROCEDURE You may need to stop taking any medicine that thins your blood. This is to prevent bleeding and bruising. Usually these medicines are stopped several days before the injection. No other preparation is needed. PROCEDURE  A trigger point injection can be given in your caregiver's office or in a clinic. Each injection takes 2 minutes or less.  Your caregiver will feel for trigger points. The caregiver may use a marker to circle the area for the injection.  The skin over the trigger point will be washed with a germ-killing (antiseptic) solution.  The caregiver pinches the spot for the injection.  Then, a very thin needle is used for the shot. You may feel pain or a twitching feeling when the needle enters the trigger point.  A numbing solution may be injected into the trigger point. Sometimes a drug to keep down swelling, redness, and warmth (inflammation) is also injected.  Your caregiver moves the needle around the trigger zone until the tightness and twitching goes away.  After the injection, your caregiver may put gentle pressure over the injection site.  Then it is covered with a bandage. AFTER THE PROCEDURE  You can go right home after the injection.  The bandage can be taken off after a few hours.  You may feel sore and stiff for 1 to 2 days.  Go back to your regular activities slowly. Your caregiver may ask you to stretch your muscles. Do not do anything that takes   extra energy for a few days.  Follow your caregiver's instructions to manage and treat other pain.   This information is not intended to replace advice given to you by your health care provider. Make sure you discuss any questions you have with your health care provider.   Document Released: 05/03/2011 Document Revised: 09/08/2012 Document Reviewed: 05/03/2011 Elsevier Interactive Patient Education 2016 [place]  What are the risk, side effects and possible complications? Generally speaking, most procedures are safe.  However, with any procedure there are risks, side effects, and the possibility of complications.  The risks and complications are dependent upon the sites that are lesioned, or the type of nerve block to be performed.  The closer the procedure is to the spine, the more serious the risks are.  Great care is taken when placing the radio frequency needles, block needles or lesioning probes, but sometimes complications can occur.  Infection: Any time there is an injection through the skin, there is a risk of infection.  This is why sterile conditions are used for these blocks.  There are four possible types of infection.  Localized skin infection.  Central Nervous System Infection-This can be in the form of Meningitis, which can be deadly.  Epidural Infections-This can be in the form of an epidural abscess, which can cause pressure inside of the spine, causing compression of the spinal cord with subsequent paralysis. This would require an emergency surgery to decompress, and there are no guarantees that the patient would recover from the paralysis.  Discitis-This is an infection of the intervertebral discs.  It occurs in about 1% of discography procedures.  It is difficult to treat and it may lead to surgery.        2. Pain: the needles have to go through skin and soft tissues, will cause soreness.       3. Damage to internal structures:  The nerves to be lesioned may be near blood vessels or    other nerves which can be potentially damaged.       4. Bleeding: Bleeding is more common if the patient is taking blood thinners such as  aspirin, Coumadin, Ticiid, Plavix, etc., or if he/she have some genetic predisposition  such as hemophilia. Bleeding into the spinal canal can cause compression of the spinal  cord with subsequent paralysis.  This would require an emergency surgery to   decompress and there are no guarantees that the patient would recover from the  paralysis.       5. Pneumothorax:  Puncturing of a lung is a possibility, every time a needle is introduced in  the area of the chest or upper back.  Pneumothorax refers to free air around the  collapsed lung(s), inside of the thoracic cavity (chest cavity).  Another two possible  complications related to a similar event would include: Hemothorax and Chylothorax.   These are variations of the Pneumothorax, where instead of air around the collapsed  lung(s), you may have blood or chyle, respectively.       6. Spinal headaches: They may occur with any procedures in the area of the spine.       7. Persistent CSF (Cerebro-Spinal Fluid) leakage: This is a rare problem, but may occur  with prolonged intrathecal or epidural catheters either due to the formation of a fistulous  track or a dural tear.       8. Nerve damage: By working so close to the spinal cord, there  is always a possibility of  nerve damage, which could be as serious as a permanent spinal cord injury with  paralysis.       9. Death:  Although rare, severe deadly allergic reactions known as "Anaphylactic  reaction" can occur to any of the medications used.      10. Worsening of the symptoms:  We can always make thing worse.  What are the chances of something like this happening? Chances of any of this occuring are extremely low.  By statistics, you have more of a chance of getting killed in a motor vehicle accident: while driving to the hospital than any of the above occurring .  Nevertheless, you should be aware that they are possibilities.  In general, it is similar to taking a shower.  Everybody knows that you can slip, hit your head and get killed.  Does that mean that you should not shower again?  Nevertheless always keep in mind that statistics do not mean anything if you happen to be on the wrong side of them.  Even if a procedure has a 1 (one) in a 1,000,000  (million) chance of going wrong, it you happen to be that one..Also, keep in mind that by statistics, you have more of a chance of having something go wrong when taking medications.  Who should not have this procedure? If you are on a blood thinning medication (e.g. Coumadin, Plavix, see list of "Blood Thinners"), or if you have an active infection going on, you should not have the procedure.  If you are taking any blood thinners, please inform your physician.  How should I prepare for this procedure?  Do not eat or drink anything at least six hours prior to the procedure.  Bring a driver with you .  It cannot be a taxi.  Come accompanied by an adult that can drive you back, and that is strong enough to help you if your legs get weak or numb from the local anesthetic.  Take all of your medicines the morning of the procedure with just enough water to swallow them.  If you have diabetes, make sure that you are scheduled to have your procedure done first thing in the morning, whenever possible.  If you have diabetes, take only half of your insulin dose and notify our nurse that you have done so as soon as you arrive at the clinic.  If you are diabetic, but only take blood sugar pills (oral hypoglycemic), then do not take them on the morning of your procedure.  You may take them after you have had the procedure.  Do not take aspirin or any aspirin-containing medications, at least eleven (11) days prior to the procedure.  They may prolong bleeding.  Wear loose fitting clothing that may be easy to take off and that you would not mind if it got stained with Betadine or blood.  Do not wear any jewelry or perfume  Remove any nail coloring.  It will interfere with some of our monitoring equipment.  NOTE: Remember that this is not meant to be interpreted as a complete list of all possible complications.  Unforeseen problems may occur.  BLOOD THINNERS The following drugs contain aspirin or other  products, which can cause increased bleeding during surgery and should not be taken for 2 weeks prior to and 1 week after surgery.  If you should need take something for relief of minor pain, you may take acetaminophen which is found in Tylenol,m Datril, Anacin-3  and Panadol. It is not blood thinner. The products listed below are.  Do not take any of the products listed below in addition to any listed on your instruction sheet.  A.P.C or A.P.C with Codeine Codeine Phosphate Capsules #3 Ibuprofen Ridaura  ABC compound Congesprin Imuran rimadil  Advil Cope Indocin Robaxisal  Alka-Seltzer Effervescent Pain Reliever and Antacid Coricidin or Coricidin-D  Indomethacin Rufen  Alka-Seltzer plus Cold Medicine Cosprin Ketoprofen S-A-C Tablets  Anacin Analgesic Tablets or Capsules Coumadin Korlgesic Salflex  Anacin Extra Strength Analgesic tablets or capsules CP-2 Tablets Lanoril Salicylate  Anaprox Cuprimine Capsules Levenox Salocol  Anexsia-D Dalteparin Magan Salsalate  Anodynos Darvon compound Magnesium Salicylate Sine-off  Ansaid Dasin Capsules Magsal Sodium Salicylate  Anturane Depen Capsules Marnal Soma  APF Arthritis pain formula Dewitt's Pills Measurin Stanback  Argesic Dia-Gesic Meclofenamic Sulfinpyrazone  Arthritis Bayer Timed Release Aspirin Diclofenac Meclomen Sulindac  Arthritis pain formula Anacin Dicumarol Medipren Supac  Analgesic (Safety coated) Arthralgen Diffunasal Mefanamic Suprofen  Arthritis Strength Bufferin Dihydrocodeine Mepro Compound Suprol  Arthropan liquid Dopirydamole Methcarbomol with Aspirin Synalgos  ASA tablets/Enseals Disalcid Micrainin Tagament  Ascriptin Doan's Midol Talwin  Ascriptin A/D Dolene Mobidin Tanderil  Ascriptin Extra Strength Dolobid Moblgesic Ticlid  Ascriptin with Codeine Doloprin or Doloprin with Codeine Momentum Tolectin  Asperbuf Duoprin Mono-gesic Trendar  Aspergum Duradyne Motrin or Motrin IB Triminicin  Aspirin plain, buffered or enteric  coated Durasal Myochrisine Trigesic  Aspirin Suppositories Easprin Nalfon Trillsate  Aspirin with Codeine Ecotrin Regular or Extra Strength Naprosyn Uracel  Atromid-S Efficin Naproxen Ursinus  Auranofin Capsules Elmiron Neocylate Vanquish  Axotal Emagrin Norgesic Verin  Azathioprine Empirin or Empirin with Codeine Normiflo Vitamin E  Azolid Emprazil Nuprin Voltaren  Bayer Aspirin plain, buffered or children's or timed BC Tablets or powders Encaprin Orgaran Warfarin Sodium  Buff-a-Comp Enoxaparin Orudis Zorpin  Buff-a-Comp with Codeine Equegesic Os-Cal-Gesic   Buffaprin Excedrin plain, buffered or Extra Strength Oxalid   Bufferin Arthritis Strength Feldene Oxphenbutazone   Bufferin plain or Extra Strength Feldene Capsules Oxycodone with Aspirin   Bufferin with Codeine Fenoprofen Fenoprofen Pabalate or Pabalate-SF   Buffets II Flogesic Panagesic   Buffinol plain or Extra Strength Florinal or Florinal with Codeine Panwarfarin   Buf-Tabs Flurbiprofen Penicillamine   Butalbital Compound Four-way cold tablets Penicillin   Butazolidin Fragmin Pepto-Bismol   Carbenicillin Geminisyn Percodan   Carna Arthritis Reliever Geopen Persantine   Carprofen Gold's salt Persistin   Chloramphenicol Goody's Phenylbutazone   Chloromycetin Haltrain Piroxlcam   Clmetidine heparin Plaquenil   Cllnoril Hyco-pap Ponstel   Clofibrate Hydroxy chloroquine Propoxyphen         Before stopping any of these medications, be sure to consult the physician who ordered them.  Some, such as Coumadin (Warfarin) are ordered to prevent or treat serious conditions such as "deep thrombosis", "pumonary embolisms", and other heart problems.  The amount of time that you may need off of the medication may also vary with the medication and the reason for which you were taking it.  If you are taking any of these medications, please make sure you notify your pain physician before you undergo any procedures.

## 2015-12-13 NOTE — Progress Notes (Signed)
Safety precautions to be maintained throughout the outpatient stay will include: orient to surroundings, keep bed in low position, maintain call bell within reach at all times, provide assistance with transfer out of bed and ambulation.  

## 2015-12-14 NOTE — Progress Notes (Signed)
Chief complaint is neck pain and shoulder pain  History of present illness: Holly Werner is here for reevaluation today. She was seen around 2 months ago and continues to have significant pain involving the left posterior neck and left trapezius muscle. This occasionally radiates into some diffuse arm pain and pain radiating into all fingers of the left hand. She denies associated numbness and tingling at this time and reports no motor weakness affecting the left side. Despite efforts at stretching strengthening and physical therapy modalities she's failed to gain any significant improvement in this condition. She has had previous x-rays that show evidence of diffuse degenerative joint disease and some bony spurring involving C4-C5 and C6 on plain film x-ray.   Today she reports that she has been doing reasonably well with physical therapy stretching but continues to have breakthrough pain for which she takes hydrocodone 1-2 tablets a day. Based on her narcotic assessment sheet this continues to work well for her.  Physical exam:  Strength in the bilateral upper extremities is without change. She does have significant tenderness with compression of the left posterior trapezius which is unchanged and has been present on previous exam. Her strength but bicep tricep grip and wrist is symmetric and good rated at 5 over 5 throughout the upper extremities. Sensation is also intact to light touch and pulses are good and capillary refill is good as well.  BP 111/47 mmHg  Pulse 72  Temp(Src) 98.2 F (36.8 C)  Resp 18  Ht 5\' 3"  (1.6 m)  Wt 130 lb (58.968 kg)  BMI 23.03 kg/m2  SpO2 95%   Current outpatient prescriptions:  .  diazepam (VALIUM) 5 MG tablet, Take 2.5 mg by mouth daily as needed for anxiety., Disp: , Rfl:  .  HYDROcodone-acetaminophen (NORCO/VICODIN) 5-325 MG tablet, Take 1 tablet by mouth 2 (two) times daily., Disp: 45 tablet, Rfl: 0 .  pantoprazole (PROTONIX) 20 MG tablet, Take 20 mg by  mouth daily., Disp: , Rfl:  .  celecoxib (CELEBREX) 200 MG capsule, Take 1 capsule (200 mg total) by mouth 2 (two) times daily., Disp: 60 capsule, Rfl: 2 .  diphenhydrAMINE (SOMINEX) 25 MG tablet, Take 25 mg by mouth at bedtime as needed for sleep. Reported on 12/13/2015, Disp: , Rfl:  .  docusate sodium (COLACE) 100 MG capsule, Take 100 mg by mouth daily. Reported on 12/13/2015, Disp: , Rfl:  .  ibuprofen (ADVIL,MOTRIN) 200 MG tablet, Take 200 mg by mouth every 6 (six) hours as needed. Reported on 12/13/2015, Disp: , Rfl:  .  Methylcellulose, Laxative, (CITRUCEL PO), Take by mouth 2 (two) times daily. Reported on 12/13/2015, Disp: , Rfl:   Patient Active Problem List   Diagnosis Date Noted  . Cervicalgia 10/28/2014  . Myofacial muscle pain 10/28/2014  . Neck pain 10/28/2014    Allergies  Allergen Reactions  . Aspirin Other (See Comments)    Stomach hurt  . Codeine Other (See Comments)    stomach hurt    Assessment #1 cervicalgia  #2 chronic opioid management At this point I think it is reasonable for her to continue on her current medication regimen But we are going to reduce her Vicodin to 45 tablets in the month. That way she can continue with 1 or 2 tablets on a daily basis for the next 2 months. Ultimately as discussed our objective is to wean this down to no more than 15-30 tablets on a monthly basis for her bad days. I will also give  her a prescription for Celebrex 200 mg tablets to be taken 1 twice a day when necessary secondary to some of the gastritis complaints that she mentions. She is to continue with the physical therapy regimens as reviewed today.  Dr. Vashti Hey 5:05 PM

## 2016-02-20 ENCOUNTER — Encounter: Payer: Self-pay | Admitting: Anesthesiology

## 2016-02-20 ENCOUNTER — Ambulatory Visit: Payer: Medicare Other | Attending: Anesthesiology | Admitting: Anesthesiology

## 2016-02-20 VITALS — BP 107/61 | HR 72 | Temp 98.0°F | Resp 16 | Ht 63.0 in | Wt 132.0 lb

## 2016-02-20 DIAGNOSIS — Z885 Allergy status to narcotic agent status: Secondary | ICD-10-CM | POA: Insufficient documentation

## 2016-02-20 DIAGNOSIS — M791 Myalgia: Secondary | ICD-10-CM | POA: Diagnosis not present

## 2016-02-20 DIAGNOSIS — Z886 Allergy status to analgesic agent status: Secondary | ICD-10-CM | POA: Diagnosis not present

## 2016-02-20 DIAGNOSIS — M25512 Pain in left shoulder: Secondary | ICD-10-CM | POA: Diagnosis not present

## 2016-02-20 DIAGNOSIS — Z79891 Long term (current) use of opiate analgesic: Secondary | ICD-10-CM | POA: Diagnosis not present

## 2016-02-20 DIAGNOSIS — M797 Fibromyalgia: Secondary | ICD-10-CM

## 2016-02-20 DIAGNOSIS — G8929 Other chronic pain: Secondary | ICD-10-CM | POA: Insufficient documentation

## 2016-02-20 DIAGNOSIS — M542 Cervicalgia: Secondary | ICD-10-CM | POA: Diagnosis not present

## 2016-02-20 DIAGNOSIS — M7918 Myalgia, other site: Secondary | ICD-10-CM

## 2016-02-20 MED ORDER — HYDROCODONE-ACETAMINOPHEN 5-325 MG PO TABS: 1.0000 | ORAL_TABLET | Freq: Two times a day (BID) | ORAL | 0 refills | Status: DC

## 2016-02-21 NOTE — Progress Notes (Signed)
Chief complaint is neck pain and shoulder pain  History of present illness: Holly Werner is here for reevaluation today. She was seen around 2 months ago and continues to have significant pain involving the left posterior neck and left trapezius muscle. This occasionally radiates into some diffuse arm pain and pain radiating into all fingers of the left hand. She denies associated numbness and tingling at this time and reports no motor weakness affecting the left side. Despite efforts at stretching strengthening and physical therapy modalities she's failed to gain any significant improvement in this condition. She has had previous x-rays that show evidence of diffuse degenerative joint disease and some bony spurring involving C4-C5 and C6 on plain film x-ray.   She is tolerating the reduction to 45 tablets per month on the Vicodin. This keeps her pain under reasonable control. No change in her symptom complex is noted but her pain continues to be quite recalcitrant. She has had difficulty doing the physical therapy exercises and has not gained much improvement in pain control with them. Otherwise she is in her usual state of health today.   Physical exam:  Strength in the bilateral upper extremities is without change. She does have significant tenderness with compression of the left posterior trapezius which is unchanged and has been present on previous exam. Her strength but bicep tricep grip and wrist is symmetric and good rated at 5 over 5 throughout the upper extremities. Sensation is also intact to light touch and pulses are good and capillary refill is good as well.  BP 107/61   Pulse 72   Temp 98 F (36.7 C)   Resp 16   Ht 5\' 3"  (1.6 m)   Wt 132 lb (59.9 kg)   SpO2 99%   BMI 23.38 kg/m    Current Outpatient Prescriptions:  .  diazepam (VALIUM) 5 MG tablet, Take 2.5 mg by mouth daily as needed for anxiety., Disp: , Rfl:  .  HYDROcodone-acetaminophen (NORCO/VICODIN) 5-325 MG tablet, Take  1 tablet by mouth 2 (two) times daily., Disp: 45 tablet, Rfl: 0 .  ibuprofen (ADVIL,MOTRIN) 200 MG tablet, Take 200 mg by mouth every 6 (six) hours as needed. Reported on 12/13/2015, Disp: , Rfl:  .  pantoprazole (PROTONIX) 20 MG tablet, Take 20 mg by mouth daily., Disp: , Rfl:  .  celecoxib (CELEBREX) 200 MG capsule, Take 1 capsule (200 mg total) by mouth 2 (two) times daily. (Patient not taking: Reported on 02/20/2016), Disp: 60 capsule, Rfl: 2 .  diphenhydrAMINE (SOMINEX) 25 MG tablet, Take 25 mg by mouth at bedtime as needed for sleep. Reported on 12/13/2015, Disp: , Rfl:  .  docusate sodium (COLACE) 100 MG capsule, Take 100 mg by mouth daily. Reported on 12/13/2015, Disp: , Rfl:  .  Methylcellulose, Laxative, (CITRUCEL PO), Take by mouth 2 (two) times daily. Reported on 12/13/2015, Disp: , Rfl:   Patient Active Problem List   Diagnosis Date Noted  . Cervicalgia 10/28/2014  . Myofacial muscle pain 10/28/2014  . Neck pain 10/28/2014    Allergies  Allergen Reactions  . Aspirin Other (See Comments)    Stomach hurt  . Codeine Other (See Comments)    stomach hurt    Assessment #1 cervicalgia  #2 chronic opioid management  We will refill her medications for the next 2 months today. We once again discussed the risks and benefits of chronic opioid management. I think her utilization is appropriate considering her current circumstances. I've encouraged her to continue doing  her physical therapy exercises and utilize anti-inflammatory medications in addition to the Vicodin for pain control. She is to return in 2 months.   Dr. Vashti Hey 8:27 AM

## 2016-04-16 ENCOUNTER — Ambulatory Visit: Payer: Medicare Other | Attending: Anesthesiology | Admitting: Anesthesiology

## 2016-04-16 ENCOUNTER — Encounter: Payer: Self-pay | Admitting: Anesthesiology

## 2016-04-16 VITALS — BP 144/60 | HR 77 | Temp 97.6°F | Resp 18 | Ht 63.0 in | Wt 134.0 lb

## 2016-04-16 DIAGNOSIS — M542 Cervicalgia: Secondary | ICD-10-CM

## 2016-04-16 DIAGNOSIS — Z79891 Long term (current) use of opiate analgesic: Secondary | ICD-10-CM | POA: Insufficient documentation

## 2016-04-16 DIAGNOSIS — M791 Myalgia: Secondary | ICD-10-CM

## 2016-04-16 DIAGNOSIS — M47812 Spondylosis without myelopathy or radiculopathy, cervical region: Secondary | ICD-10-CM | POA: Diagnosis not present

## 2016-04-16 DIAGNOSIS — M25519 Pain in unspecified shoulder: Secondary | ICD-10-CM | POA: Diagnosis not present

## 2016-04-16 DIAGNOSIS — M7918 Myalgia, other site: Secondary | ICD-10-CM

## 2016-04-16 MED ORDER — HYDROCODONE-ACETAMINOPHEN 5-325 MG PO TABS: 1.0000 | ORAL_TABLET | Freq: Two times a day (BID) | ORAL | 0 refills | Status: DC

## 2016-04-16 MED ORDER — CYCLOBENZAPRINE HCL 10 MG PO TABS: 10.0000 mg | ORAL_TABLET | Freq: Three times a day (TID) | ORAL | 1 refills | Status: DC | PRN

## 2016-04-17 NOTE — Progress Notes (Signed)
Chief complaint is neck pain and shoulder pain  History of present illness: Holly Werner is here for reevaluation today. She was seen around 2 months ago and continues to have significant pain involving the left posterior neck and left trapezius muscle and posterior shoulder. This occasionally radiates into some diffuse arm pain and pain radiating into all fingers of the left hand. She denies associated numbness and tingling at this time and reports no motor weakness affecting the left side. Despite efforts at stretching strengthening and physical therapy modalities she's failed to gain any significant improvement in this condition. She has had previous x-rays that show evidence of diffuse degenerative joint disease and some bony spurring involving C4-C5 and C6 on plain film x-ray.   She is tolerating the reduction to 45 tablets per month on the Vicodin. This keeps her pain under reasonable control. No change in her symptom complex is noted but her pain continues to be quite recalcitrant. She has had difficulty doing the physical therapy exercises and has not gained much improvement in pain control with them. Otherwise she is in her usual state of health today.   Physical exam:  Strength in the bilateral upper extremities is without change. She does have significant tenderness with compression of the left posterior trapezius which is unchanged and has been present on previous exam. Her strength but bicep tricep grip and wrist is symmetric and good rated at 5 over 5 throughout the upper extremities. Sensation is also intact to light touch and pulses are good and capillary refill is good as well.  BP (!) 144/60   Pulse 77   Temp 97.6 F (36.4 C) (Oral)   Resp 18   Ht 5\' 3"  (1.6 m)   Wt 134 lb (60.8 kg)   SpO2 96%   BMI 23.74 kg/m    Current Outpatient Prescriptions:  .  diazepam (VALIUM) 5 MG tablet, Take 2.5 mg by mouth daily as needed for anxiety., Disp: , Rfl:  .  HYDROcodone-acetaminophen  (NORCO/VICODIN) 5-325 MG tablet, Take 1 tablet by mouth 2 (two) times daily., Disp: 45 tablet, Rfl: 0 .  ibuprofen (ADVIL,MOTRIN) 200 MG tablet, Take 200 mg by mouth every 6 (six) hours as needed. Reported on 12/13/2015, Disp: , Rfl:  .  pantoprazole (PROTONIX) 20 MG tablet, Take 20 mg by mouth daily., Disp: , Rfl:  .  cyclobenzaprine (FLEXERIL) 10 MG tablet, Take 1 tablet (10 mg total) by mouth 3 (three) times daily as needed for muscle spasms., Disp: 30 tablet, Rfl: 1 .  docusate sodium (COLACE) 100 MG capsule, Take 100 mg by mouth daily. Reported on 12/13/2015, Disp: , Rfl:  .  Methylcellulose, Laxative, (CITRUCEL PO), Take by mouth 2 (two) times daily. Reported on 12/13/2015, Disp: , Rfl:   Patient Active Problem List   Diagnosis Date Noted  . Cervicalgia 10/28/2014  . Myofacial muscle pain 10/28/2014  . Neck pain 10/28/2014    Allergies  Allergen Reactions  . Aspirin Other (See Comments)    Stomach hurt  . 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 We once again discussed the risks and benefits of chronic opioid management. I think her utilization is appropriate considering her current circumstances And the severe pain she is experiencing. I've talked her about possible repeat trigger point injections at her  next visit if she should experience any significant exacerbation or spasming.Rene Paci encouraged her to continue doing her physical therapy exercises and utilize anti-inflammatory medications in addition to the Vicodin for pain control. She is to return in 2 months.   Dr. Vashti Hey 6:56 PM

## 2016-04-25 LAB — TOXASSURE SELECT 13 (MW), URINE

## 2016-04-27 ENCOUNTER — Telehealth: Payer: Self-pay | Admitting: Anesthesiology

## 2016-04-27 NOTE — Telephone Encounter (Signed)
Attempted to call [employer] x 2.  Was on hold for 30 minutes each time.  PA was sent on 04-25-16.  I resent PA today marking it urgent.  Notified front desk to put call through if they call back.

## 2016-04-27 NOTE — Telephone Encounter (Signed)
[employer] rep called stating they need info on prior authorization for patient ASAP as they are getting ready to close out and deny.  Please call [employer] at  [phone]

## 2016-04-30 ENCOUNTER — Telehealth: Payer: Self-pay

## 2016-04-30 NOTE — Telephone Encounter (Signed)
Insurance company called and left voicemail that the patients medications that were written has been denied. Please call them for further explanation.

## 2016-05-01 NOTE — Telephone Encounter (Signed)
Spoke with patient, she states that she had called to let someone know to please stop working on this.  Her Rx for the cyclobenzaprine is $4 and she does not wish to pursue PA for this.  Let patient know that I would make a note of it in her chart and scan what we have already done into her chart for future reference should we need it.

## 2016-06-12 ENCOUNTER — Encounter: Payer: Self-pay | Admitting: Anesthesiology

## 2016-06-12 ENCOUNTER — Ambulatory Visit: Payer: Medicare Other | Attending: Anesthesiology | Admitting: Anesthesiology

## 2016-06-12 VITALS — BP 127/74 | HR 80 | Temp 97.7°F | Resp 18 | Ht 63.5 in | Wt 134.0 lb

## 2016-06-12 DIAGNOSIS — Z886 Allergy status to analgesic agent status: Secondary | ICD-10-CM | POA: Insufficient documentation

## 2016-06-12 DIAGNOSIS — Z79891 Long term (current) use of opiate analgesic: Secondary | ICD-10-CM | POA: Diagnosis not present

## 2016-06-12 DIAGNOSIS — M792 Neuralgia and neuritis, unspecified: Secondary | ICD-10-CM | POA: Diagnosis not present

## 2016-06-12 DIAGNOSIS — M7918 Myalgia, other site: Secondary | ICD-10-CM

## 2016-06-12 DIAGNOSIS — M791 Myalgia: Secondary | ICD-10-CM

## 2016-06-12 DIAGNOSIS — M5030 Other cervical disc degeneration, unspecified cervical region: Secondary | ICD-10-CM | POA: Diagnosis not present

## 2016-06-12 DIAGNOSIS — Z881 Allergy status to other antibiotic agents status: Secondary | ICD-10-CM | POA: Insufficient documentation

## 2016-06-12 DIAGNOSIS — M25519 Pain in unspecified shoulder: Secondary | ICD-10-CM | POA: Insufficient documentation

## 2016-06-12 DIAGNOSIS — Z885 Allergy status to narcotic agent status: Secondary | ICD-10-CM | POA: Insufficient documentation

## 2016-06-12 DIAGNOSIS — G8929 Other chronic pain: Secondary | ICD-10-CM | POA: Insufficient documentation

## 2016-06-12 DIAGNOSIS — M542 Cervicalgia: Secondary | ICD-10-CM

## 2016-06-12 MED ORDER — HYDROCODONE-ACETAMINOPHEN 5-325 MG PO TABS: 1.0000 | ORAL_TABLET | Freq: Two times a day (BID) | ORAL | 0 refills | Status: DC

## 2016-06-12 NOTE — Progress Notes (Signed)
Safety precautions to be maintained throughout the outpatient stay will include: orient to surroundings, keep bed in low position, maintain call bell within reach at all times, provide assistance with transfer out of bed and ambulation.  

## 2016-06-12 NOTE — Patient Instructions (Signed)
Trigger Point Injection Trigger points are areas where you have pain. A trigger point injection is a shot given in the trigger point to help relieve pain for a few days to a few months. Common places for trigger points include:  The neck.  The shoulders.  The upper back.  The lower back. A trigger point injection will not cure long-lasting (chronic) pain permanently. These injections do not always work for every person, but for some people they can help to relieve pain for a few days to a few months. Tell a health care provider about:  Any allergies you have.  All medicines you are taking, including vitamins, herbs, eye drops, creams, and over-the-counter medicines.  Any problems you or family members have had with anesthetic medicines.  Any blood disorders you have.  Any surgeries you have had.  Any medical conditions you have. What are the risks? Generally, this is a safe procedure. However, problems may occur, including:  Infection.  Bleeding.  Allergic reaction to the injected medicine.  Irritation of the skin around the injection site. What happens before the procedure?  Ask your health care provider about changing or stopping your regular medicines. This is especially important if you are taking diabetes medicines or blood thinners. What happens during the procedure?  Your health care provider will feel for trigger points. A marker may be used to circle the area for the injection.  The skin over the trigger point will be washed with a germ-killing (antiseptic) solution.  A thin needle is used for the shot. You may feel pain or a twitching feeling when the needle enters the trigger point.  A numbing solution may be injected into the trigger point. Sometimes a medicine to keep down swelling, redness, and warmth (inflammation) is also injected.  Your health care provider may move the needle around the area where the trigger point is located until the tightness and  twitching goes away.  After the injection, your health care provider may put gentle pressure over the injection site.  The injection site will be covered with a bandage (dressing). The procedure may vary among health care providers and hospitals. What happens after the procedure?  The dressing can be taken off in a few hours or as told by your health care provider.  You may feel sore and stiff for 1-2 days. This information is not intended to replace advice given to you by your health care provider. Make sure you discuss any questions you have with your health care provider. Document Released: 05/03/2011 Document Revised: 01/15/2016 Document Reviewed: 11/01/2014 Elsevier Interactive Patient Education  2017 [employer]  What are the risk, side effects and possible complications? Generally speaking, most procedures are safe.  However, with any procedure there are risks, side effects, and the possibility of complications.  The risks and complications are dependent upon the sites that are lesioned, or the type of nerve block to be performed.  The closer the procedure is to the spine, the more serious the risks are.  Great care is taken when placing the radio frequency needles, block needles or lesioning probes, but sometimes complications can occur. 1. Infection: Any time there is an injection through the skin, there is a risk of infection.  This is why sterile conditions are used for these blocks.  There are four possible types of infection. 1. Localized skin infection. 2. Central Nervous System Infection-This can be in the form of Meningitis, which can be deadly. 3. Epidural  Infections-This can be in the form of an epidural abscess, which can cause pressure inside of the spine, causing compression of the spinal cord with subsequent paralysis. This would require an emergency surgery to decompress, and there are no guarantees that the patient would recover from the  paralysis. 4. Discitis-This is an infection of the intervertebral discs.  It occurs in about 1% of discography procedures.  It is difficult to treat and it may lead to surgery.        2. Pain: the needles have to go through skin and soft tissues, will cause soreness.       3. Damage to internal structures:  The nerves to be lesioned may be near blood vessels or    other nerves which can be potentially damaged.       4. Bleeding: Bleeding is more common if the patient is taking blood thinners such as  aspirin, Coumadin, Ticiid, Plavix, etc., or if he/she have some genetic predisposition  such as hemophilia. Bleeding into the spinal canal can cause compression of the spinal  cord with subsequent paralysis.  This would require an emergency surgery to  decompress and there are no guarantees that the patient would recover from the  paralysis.       5. Pneumothorax:  Puncturing of a lung is a possibility, every time a needle is introduced in  the area of the chest or upper back.  Pneumothorax refers to free air around the  collapsed lung(s), inside of the thoracic cavity (chest cavity).  Another two possible  complications related to a similar event would include: Hemothorax and Chylothorax.   These are variations of the Pneumothorax, where instead of air around the collapsed  lung(s), you may have blood or chyle, respectively.       6. Spinal headaches: They may occur with any procedures in the area of the spine.       7. Persistent CSF (Cerebro-Spinal Fluid) leakage: This is a rare problem, but may occur  with prolonged intrathecal or epidural catheters either due to the formation of a fistulous  track or a dural tear.       8. Nerve damage: By working so close to the spinal cord, there is always a possibility of  nerve damage, which could be as serious as a permanent spinal cord injury with  paralysis.       9. Death:  Although rare, severe deadly allergic reactions known as "Anaphylactic  reaction" can  occur to any of the medications used.      10. Worsening of the symptoms:  We can always make thing worse.  What are the chances of something like this happening? Chances of any of this occuring are extremely low.  By statistics, you have more of a chance of getting killed in a motor vehicle accident: while driving to the hospital than any of the above occurring .  Nevertheless, you should be aware that they are possibilities.  In general, it is similar to taking a shower.  Everybody knows that you can slip, hit your head and get killed.  Does that mean that you should not shower again?  Nevertheless always keep in mind that statistics do not mean anything if you happen to be on the wrong side of them.  Even if a procedure has a 1 (one) in a 1,000,000 (million) chance of going wrong, it you happen to be that one..Also, keep in mind that by statistics, you have more of a chance  of having something go wrong when taking medications.  Who should not have this procedure? If you are on a blood thinning medication (e.g. Coumadin, Plavix, see list of "Blood Thinners"), or if you have an active infection going on, you should not have the procedure.  If you are taking any blood thinners, please inform your physician.  How should I prepare for this procedure?  Do not eat or drink anything at least six hours prior to the procedure.  Bring a driver with you .  It cannot be a taxi.  Come accompanied by an adult that can drive you back, and that is strong enough to help you if your legs get weak or numb from the local anesthetic.  Take all of your medicines the morning of the procedure with just enough water to swallow them.  If you have diabetes, make sure that you are scheduled to have your procedure done first thing in the morning, whenever possible.  If you have diabetes, take only half of your insulin dose and notify our nurse that you have done so as soon as you arrive at the clinic.  If you are  diabetic, but only take blood sugar pills (oral hypoglycemic), then do not take them on the morning of your procedure.  You may take them after you have had the procedure.  Do not take aspirin or any aspirin-containing medications, at least eleven (11) days prior to the procedure.  They may prolong bleeding.  Wear loose fitting clothing that may be easy to take off and that you would not mind if it got stained with Betadine or blood.  Do not wear any jewelry or perfume  Remove any nail coloring.  It will interfere with some of our monitoring equipment.  NOTE: Remember that this is not meant to be interpreted as a complete list of all possible complications.  Unforeseen problems may occur.  BLOOD THINNERS The following drugs contain aspirin or other products, which can cause increased bleeding during surgery and should not be taken for 2 weeks prior to and 1 week after surgery.  If you should need take something for relief of minor pain, you may take acetaminophen which is found in Tylenol,m Datril, Anacin-3 and Panadol. It is not blood thinner. The products listed below are.  Do not take any of the products listed below in addition to any listed on your instruction sheet.  A.P.C or A.P.C with Codeine Codeine Phosphate Capsules #3 Ibuprofen Ridaura  ABC compound Congesprin Imuran rimadil  Advil Cope Indocin Robaxisal  Alka-Seltzer Effervescent Pain Reliever and Antacid Coricidin or Coricidin-D  Indomethacin Rufen  Alka-Seltzer plus Cold Medicine Cosprin Ketoprofen S-A-C Tablets  Anacin Analgesic Tablets or Capsules Coumadin Korlgesic Salflex  Anacin Extra Strength Analgesic tablets or capsules CP-2 Tablets Lanoril Salicylate  Anaprox Cuprimine Capsules Levenox Salocol  Anexsia-D Dalteparin Magan Salsalate  Anodynos Darvon compound Magnesium Salicylate Sine-off  Ansaid Dasin Capsules Magsal Sodium Salicylate  Anturane Depen Capsules Marnal Soma  APF Arthritis pain formula Dewitt's Pills  Measurin Stanback  Argesic Dia-Gesic Meclofenamic Sulfinpyrazone  Arthritis Bayer Timed Release Aspirin Diclofenac Meclomen Sulindac  Arthritis pain formula Anacin Dicumarol Medipren Supac  Analgesic (Safety coated) Arthralgen Diffunasal Mefanamic Suprofen  Arthritis Strength Bufferin Dihydrocodeine Mepro Compound Suprol  Arthropan liquid Dopirydamole Methcarbomol with Aspirin Synalgos  ASA tablets/Enseals Disalcid Micrainin Tagament  Ascriptin Doan's Midol Talwin  Ascriptin A/D Dolene Mobidin Tanderil  Ascriptin Extra Strength Dolobid Moblgesic Ticlid  Ascriptin with Codeine Doloprin or Doloprin with Codeine Momentum  Tolectin  Asperbuf Duoprin Mono-gesic Trendar  Aspergum Duradyne Motrin or Motrin IB Triminicin  Aspirin plain, buffered or enteric coated Durasal Myochrisine Trigesic  Aspirin Suppositories Easprin Nalfon Trillsate  Aspirin with Codeine Ecotrin Regular or Extra Strength Naprosyn Uracel  Atromid-S Efficin Naproxen Ursinus  Auranofin Capsules Elmiron Neocylate Vanquish  Axotal Emagrin Norgesic Verin  Azathioprine Empirin or Empirin with Codeine Normiflo Vitamin E  Azolid Emprazil Nuprin Voltaren  Bayer Aspirin plain, buffered or children's or timed BC Tablets or powders Encaprin Orgaran Warfarin Sodium  Buff-a-Comp Enoxaparin Orudis Zorpin  Buff-a-Comp with Codeine Equegesic Os-Cal-Gesic   Buffaprin Excedrin plain, buffered or Extra Strength Oxalid   Bufferin Arthritis Strength Feldene Oxphenbutazone   Bufferin plain or Extra Strength Feldene Capsules Oxycodone with Aspirin   Bufferin with Codeine Fenoprofen Fenoprofen Pabalate or Pabalate-SF   Buffets II Flogesic Panagesic   Buffinol plain or Extra Strength Florinal or Florinal with Codeine Panwarfarin   Buf-Tabs Flurbiprofen Penicillamine   Butalbital Compound Four-way cold tablets Penicillin   Butazolidin Fragmin Pepto-Bismol   Carbenicillin Geminisyn Percodan   Carna Arthritis Reliever Geopen Persantine     Carprofen Gold's salt Persistin   Chloramphenicol Goody's Phenylbutazone   Chloromycetin Haltrain Piroxlcam   Clmetidine heparin Plaquenil   Cllnoril Hyco-pap Ponstel   Clofibrate Hydroxy chloroquine Propoxyphen         Before stopping any of these medications, be sure to consult the physician who ordered them.  Some, such as Coumadin (Warfarin) are ordered to prevent or treat serious conditions such as "deep thrombosis", "pumonary embolisms", and other heart problems.  The amount of time that you may need off of the medication may also vary with the medication and the reason for which you were taking it.  If you are taking any of these medications, please make sure you notify your pain physician before you undergo any procedures.

## 2016-06-13 NOTE — Progress Notes (Signed)
Chief complaint is neck pain and shoulder pain  History of present illness: Holly Werner is here for reevaluation today. She was last seen 2 months ago and has been doing reasonably well. 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 She is taking her Vicodin sparingly averaging about 1 or 2 tablets per day and 45 on a monthly cycle. She is doing some stretching strengthening exercises but the pain that she has experienced has been difficult to manage for her. I have also reviewed her narcotic assessment sheet and she appears to derive good lifestyle benefit and pain control with the medications without diverting or illicit use.  She is tolerating the reduction to 45 tablets per month on the Vicodin. This keeps her pain under reasonable control. No change in her symptom complex is noted but her pain continues to be quite recalcitrant. She has had difficulty doing the physical therapy exercises and has not gained much improvement in pain control with them. Otherwise she is in her usual state of health today.   Physical exam:  Strength in the bilateral upper extremities is without change. She does have significant tenderness with compression of the left posterior trapezius which is unchanged.Marland Kitchen Her strength but bicep tricep grip and wrist is symmetric and good rated at 5 over 5 throughout the upper extremities. Sensation is also intact to light touch and pulses are good and capillary refill is good as well.  BP 127/74   Pulse 80   Temp 97.7 F (36.5 C) (Oral)   Resp 18   Ht 5' 3.5" (1.613 m)   Wt 134 lb (60.8 kg)   SpO2 94%   BMI 23.36 kg/m    Current Outpatient Prescriptions:  .  albuterol (PROVENTIL HFA;VENTOLIN HFA) 108 (90 Base) MCG/ACT inhaler, Inhale into the lungs., Disp: , Rfl:  .  cyclobenzaprine (FLEXERIL) 10 MG tablet, Take 1 tablet (10 mg total) by mouth 3 (three) times daily as needed for muscle spasms., Disp: 30 tablet, Rfl: 1 .   HYDROcodone-acetaminophen (NORCO/VICODIN) 5-325 MG tablet, Take 1 tablet by mouth 2 (two) times daily., Disp: 45 tablet, Rfl: 0 .  ibuprofen (ADVIL,MOTRIN) 200 MG tablet, Take 200 mg by mouth every 6 (six) hours as needed. Reported on 12/13/2015, Disp: , Rfl:  .  docusate sodium (COLACE) 100 MG capsule, Take 100 mg by mouth daily. Reported on 12/13/2015, Disp: , Rfl:  .  Methylcellulose, Laxative, (CITRUCEL PO), Take by mouth 2 (two) times daily. Reported on 12/13/2015, Disp: , Rfl:  .  pantoprazole (PROTONIX) 20 MG tablet, Take 20 mg by mouth daily., Disp: , Rfl:   Patient Active Problem List   Diagnosis Date Noted  . Cervicalgia 10/28/2014  . Myofacial muscle pain 10/28/2014  . Neck pain 10/28/2014    Allergies  Allergen Reactions  . Aspirin Other (See Comments)    Stomach hurt  . Codeine Other (See Comments)    stomach hurt  . 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 Her physician practitioner database has been reviewed and is appropriate. She is to return to clinic in 2 months.   Dr. Vashti Hey 10:23 AM

## 2016-09-28 ENCOUNTER — Telehealth: Payer: Self-pay | Admitting: *Deleted

## 2016-09-28 NOTE — Telephone Encounter (Signed)
Received referral for low dose lung cancer screening CT scan. Message left at phone number listed in EMR for patient to call me back to facilitate scheduling scan.  

## 2016-10-02 ENCOUNTER — Telehealth: Payer: Self-pay | Admitting: *Deleted

## 2016-10-02 DIAGNOSIS — Z87891 Personal history of nicotine dependence: Secondary | ICD-10-CM

## 2016-10-02 NOTE — Addendum Note (Signed)
Addended by: Lieutenant Diego on: 10/02/2016 10:54 AM   Modules accepted: Orders

## 2016-10-02 NOTE — Telephone Encounter (Signed)
Received referral for initial lung cancer screening scan. Contacted patient and obtained smoking history,(current, 38.25 pack year) as well as answering questions related to screening process. Patient denies signs of lung cancer such as weight loss or hemoptysis. Patient denies comorbidity that would prevent curative treatment if lung cancer were found. Patient is scheduled for shared decision making visit and CT scan on 01/09/17.

## 2016-10-02 NOTE — Telephone Encounter (Signed)
Received referral for low dose lung cancer screening CT scan. Message left at phone number listed in EMR for patient to call me back to facilitate scheduling scan.  

## 2016-10-09 ENCOUNTER — Inpatient Hospital Stay: Payer: Medicare Other | Attending: Oncology | Admitting: Oncology

## 2016-10-09 ENCOUNTER — Ambulatory Visit
Admission: RE | Admit: 2016-10-09 | Discharge: 2016-10-09 | Disposition: A | Discharge status: Home / Self Care | Payer: Medicare Other | Source: Ambulatory Visit | Attending: Oncology | Admitting: Oncology

## 2016-10-09 ENCOUNTER — Encounter: Payer: Self-pay | Admitting: Oncology

## 2016-10-09 DIAGNOSIS — J438 Other emphysema: Secondary | ICD-10-CM | POA: Diagnosis not present

## 2016-10-09 DIAGNOSIS — I700 Atherosclerosis of aorta: Secondary | ICD-10-CM | POA: Diagnosis not present

## 2016-10-09 DIAGNOSIS — I2510 Atherosclerotic heart disease of native coronary artery without angina pectoris: Secondary | ICD-10-CM | POA: Insufficient documentation

## 2016-10-09 DIAGNOSIS — F17210 Nicotine dependence, cigarettes, uncomplicated: Secondary | ICD-10-CM | POA: Diagnosis not present

## 2016-10-09 DIAGNOSIS — Z87891 Personal history of nicotine dependence: Secondary | ICD-10-CM | POA: Diagnosis present

## 2016-10-09 DIAGNOSIS — R911 Solitary pulmonary nodule: Secondary | ICD-10-CM | POA: Insufficient documentation

## 2016-10-09 DIAGNOSIS — Z122 Encounter for screening for malignant neoplasm of respiratory organs: Secondary | ICD-10-CM | POA: Diagnosis not present

## 2016-10-09 NOTE — Progress Notes (Signed)
In accordance with CMS guidelines, patient has met eligibility criteria including age, absence of signs or symptoms of lung cancer.  Social History  Substance Use Topics  . Smoking status: Current Every Day Smoker    Packs/day: 0.75    Years: 51.00    Types: Cigarettes  . Smokeless tobacco: Never Used     Comment: 10 cigarettes perday  . Alcohol use No     A shared decision-making session was conducted prior to the performance of CT scan. This includes one or more decision aids, includes benefits and harms of screening, follow-up diagnostic testing, over-diagnosis, false positive rate, and total radiation exposure.  Counseling on the importance of adherence to annual lung cancer LDCT screening, impact of co-morbidities, and ability or willingness to undergo diagnosis and treatment is imperative for compliance of the program.  Counseling on the importance of continued smoking cessation for former smokers; the importance of smoking cessation for current smokers, and information about tobacco cessation interventions have been given to patient including [place] and [facility] quit [place] programs.  Written order for lung cancer screening with LDCT has been given to the patient and any and all questions have been answered to the best of my abilities.   Yearly follow up will be coordinated by Burgess Estelle, Thoracic Navigator.

## 2016-10-12 ENCOUNTER — Telehealth: Payer: Self-pay | Admitting: *Deleted

## 2016-10-12 NOTE — Telephone Encounter (Signed)
Notified patient of LDCT lung cancer screening program results with recommendation for 6 month follow up imaging. Also notified of incidental findings noted below and is encouraged to discuss further with PCP who will receive a copy of this note and/or the CT report. Patient verbalizes understanding.   ADDENDUM REPORT: 10/09/2016 16:38  ADDENDUM: Clinical service requested comparison to remote prior CTs. When compared to the CT of 10/10/2011, the right lower lobe pulmonary nodule was present, including at 9 mm. This is consistent with a benign etiology. 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 Lung-RADS 3, probably benign findings. Short-term follow-up in 6 months is recommended with repeat low-dose chest CT without contrast (please use the following order, "CT CHEST LCS NODULE FOLLOW-UP W/O CM").

## 2016-12-03 ENCOUNTER — Other Ambulatory Visit: Payer: Self-pay | Admitting: Physician Assistant

## 2016-12-03 ENCOUNTER — Observation Stay (HOSPITAL_BASED_OUTPATIENT_CLINIC_OR_DEPARTMENT_OTHER)
Admission: EM | Admit: 2016-12-03 | Discharge: 2016-12-04 | Disposition: A | Discharge status: Home / Self Care | Payer: Medicare Other | Source: Home / Self Care | Attending: Emergency Medicine | Admitting: Emergency Medicine

## 2016-12-03 ENCOUNTER — Ambulatory Visit
Admission: RE | Admit: 2016-12-03 | Discharge: 2016-12-03 | Disposition: A | Discharge status: Home / Self Care | Payer: Medicare Other | Source: Ambulatory Visit | Attending: Physician Assistant | Admitting: Physician Assistant

## 2016-12-03 ENCOUNTER — Encounter: Payer: Self-pay | Admitting: Emergency Medicine

## 2016-12-03 DIAGNOSIS — K353 Acute appendicitis with localized peritonitis: Secondary | ICD-10-CM

## 2016-12-03 DIAGNOSIS — F17210 Nicotine dependence, cigarettes, uncomplicated: Secondary | ICD-10-CM

## 2016-12-03 DIAGNOSIS — K219 Gastro-esophageal reflux disease without esophagitis: Secondary | ICD-10-CM

## 2016-12-03 DIAGNOSIS — Z886 Allergy status to analgesic agent status: Secondary | ICD-10-CM

## 2016-12-03 DIAGNOSIS — Z79899 Other long term (current) drug therapy: Secondary | ICD-10-CM

## 2016-12-03 DIAGNOSIS — R1031 Right lower quadrant pain: Secondary | ICD-10-CM

## 2016-12-03 DIAGNOSIS — Z888 Allergy status to other drugs, medicaments and biological substances status: Secondary | ICD-10-CM

## 2016-12-03 DIAGNOSIS — I38 Endocarditis, valve unspecified: Secondary | ICD-10-CM | POA: Diagnosis present

## 2016-12-03 DIAGNOSIS — F419 Anxiety disorder, unspecified: Secondary | ICD-10-CM

## 2016-12-03 DIAGNOSIS — Z885 Allergy status to narcotic agent status: Secondary | ICD-10-CM | POA: Diagnosis not present

## 2016-12-03 DIAGNOSIS — K3580 Unspecified acute appendicitis: Secondary | ICD-10-CM | POA: Diagnosis present

## 2016-12-03 DIAGNOSIS — K37 Unspecified appendicitis: Secondary | ICD-10-CM

## 2016-12-03 DIAGNOSIS — F17200 Nicotine dependence, unspecified, uncomplicated: Secondary | ICD-10-CM | POA: Diagnosis not present

## 2016-12-03 DIAGNOSIS — K589 Irritable bowel syndrome without diarrhea: Secondary | ICD-10-CM | POA: Insufficient documentation

## 2016-12-03 DIAGNOSIS — G8929 Other chronic pain: Secondary | ICD-10-CM

## 2016-12-03 DIAGNOSIS — E28319 Asymptomatic premature menopause: Secondary | ICD-10-CM | POA: Insufficient documentation

## 2016-12-03 DIAGNOSIS — J449 Chronic obstructive pulmonary disease, unspecified: Secondary | ICD-10-CM | POA: Diagnosis present

## 2016-12-03 DIAGNOSIS — M47892 Other spondylosis, cervical region: Secondary | ICD-10-CM | POA: Insufficient documentation

## 2016-12-03 DIAGNOSIS — Z881 Allergy status to other antibiotic agents status: Secondary | ICD-10-CM | POA: Diagnosis not present

## 2016-12-03 MED ORDER — PIPERACILLIN-TAZOBACTAM 3.375 G IVPB
3.3750 g | Freq: Three times a day (TID) | INTRAVENOUS | Status: DC
  Administered 2016-12-03 – 2016-12-04 (×3): 3.375 g via INTRAVENOUS
  Filled 2016-12-03 (×3): qty 50

## 2016-12-03 MED ORDER — ACETAMINOPHEN 325 MG PO TABS: 650.0000 mg | ORAL_TABLET | Freq: Four times a day (QID) | ORAL | Status: DC | PRN

## 2016-12-03 MED ORDER — ACETAMINOPHEN 650 MG RE SUPP: 650.0000 mg | Freq: Four times a day (QID) | RECTAL | Status: DC | PRN

## 2016-12-03 MED ORDER — HEPARIN SODIUM (PORCINE) 5000 UNIT/ML IJ SOLN
5000.0000 [IU] | Freq: Three times a day (TID) | INTRAMUSCULAR | Status: DC
  Administered 2016-12-03 – 2016-12-04 (×3): 5000 [IU] via SUBCUTANEOUS
  Filled 2016-12-03 (×3): qty 1

## 2016-12-03 MED ORDER — KCL IN DEXTROSE-NACL 20-5-0.45 MEQ/L-%-% IV SOLN
INTRAVENOUS | Status: DC
  Administered 2016-12-03 – 2016-12-04 (×3): via INTRAVENOUS
  Filled 2016-12-03 (×5): qty 1000

## 2016-12-03 MED ORDER — MORPHINE SULFATE (PF) 2 MG/ML IV SOLN
2.0000 mg | INTRAVENOUS | Status: DC | PRN
  Administered 2016-12-03: 2 mg via INTRAVENOUS
  Filled 2016-12-03: qty 1

## 2016-12-03 MED ORDER — IOPAMIDOL (ISOVUE-300) INJECTION 61%
100.0000 mL | Freq: Once | INTRAVENOUS | Status: AC | PRN
  Administered 2016-12-03: 100 mL via INTRAVENOUS

## 2016-12-03 MED ORDER — ONDANSETRON HCL 4 MG/2ML IJ SOLN: 4.0000 mg | Freq: Four times a day (QID) | INTRAMUSCULAR | Status: DC | PRN

## 2016-12-03 NOTE — ED Triage Notes (Signed)
Pt here for acute appendicitis.  Has labs in care everywhere, not repeated in triage.  RLQ/mid abdominal pain started Saturday and has eased some, mostly when moving right leg. NAD. No  Known fevers.

## 2016-12-03 NOTE — H&P (Signed)
SURGICAL HISTORY & PHYSICAL (cpt [id number]) HISTORY OF PRESENT ILLNESS (HPI):  66 y.o. female presented to [facility] ED today upon referral from her PMD to whom she presented for generalized abdominal pain that began 2.5 days ago (Friday night) and progressed to worsened RLQ abdominal pain 2 days ago (Saturday), associated with several loose BM's that same day, for which she presented to her PMD, who ordered an outpatient CT of patient's abdomen and pelvis. Patient reports her pain has been well-controlled while in the Emergency Department and denies any fever/chills, N/V, or CP, but she does, however, report significant baseline SOB after walking up or down a flight of stairs or walking the equivalent of 1 block, from which it takes her "a while" to recover. She also states this has been worse recently.   PAST MEDICAL HISTORY (PMH):  Past Medical History:  Diagnosis Date  . Chronic pain   . Thoracic outlet syndrome     Reviewed. Otherwise negative.   PAST SURGICAL HISTORY ([place]):  Past Surgical History:  Procedure Laterality Date  . ABDOMINAL HYSTERECTOMY    . THORACIC OUTLET SURGERY      Reviewed. Otherwise negative.   MEDICATIONS:  Prior to Admission medications   Medication Sig Start Date End Date Taking? Authorizing Provider  diazepam (VALIUM) 5 MG tablet Take 2.5 mg by mouth 2 (two) times daily as needed for anxiety.   Yes [provider]  albuterol (PROVENTIL HFA;VENTOLIN HFA) 108 (90 Base) MCG/ACT inhaler Inhale 1 puff into the lungs every 4 (four) hours as needed.  04/24/16 10/21/16  [provider]  cyclobenzaprine (FLEXERIL) 10 MG tablet Take 1 tablet (10 mg total) by mouth 3 (three) times daily as needed for muscle spasms. Patient not taking: Reported on 12/03/2016 04/16/16   Molli Barrows, MD  HYDROcodone-acetaminophen (NORCO/VICODIN) 5-325 MG tablet Take 1 tablet by mouth 2 (two) times daily. Patient not taking: Reported on 12/03/2016 06/12/16   Molli Barrows, MD      ALLERGIES:  Allergies  Allergen Reactions  . Aspirin Other (See Comments)    Stomach hurt  . Codeine Other (See Comments)    stomach hurt  . Levofloxacin     Other reaction(s): Dizziness     SOCIAL HISTORY:  Social History   Social History  . Marital status: Married    Spouse name: N/A  . Number of children: N/A  . Years of education: N/A   Occupational History  . Not on file.   Social History Main Topics  . Smoking status: Current Every Day Smoker    Packs/day: 0.75    Years: 51.00    Types: Cigarettes  . Smokeless tobacco: Never Used     Comment: 10 cigarettes perday  . Alcohol use No  . Drug use: No  . Sexual activity: Not on file   Other Topics Concern  . Not on file   Social History Narrative  . No narrative on file    The patient currently resides (home / rehab facility / nursing home): Home  The patient normally is (ambulatory / bedbound) : Ambulatory, though limited by SOB with exertion   FAMILY HISTORY:  Family History  Problem Relation Age of Onset  . Stroke Mother   . Vision loss Mother   . Kidney disease Father     Otherwise negative.   REVIEW OF SYSTEMS:  Constitutional: denies any other weight loss, fever, chills, or sweats  Eyes: denies any other vision changes, history of eye injury  ENT:  denies sore throat, hearing problems  Respiratory: denies shortness of breath, wheezing  Cardiovascular: denies chest pain, palpitations  Gastrointestinal: abdominal pain, N/V, and bowel function as per HPI  Genitourinary: denies burning with urination or urinary frequency Musculoskeletal: denies any other joint pains or cramps  Skin: Denies any other rashes or skin discolorations  Neurological: denies any other headache, dizziness, weakness  Psychiatric: denies any other depression, anxiety   All other review of systems were otherwise negative.  VITAL SIGNS:  Temp:  [98.1 F (36.7 C)] 98.1 F (36.7 C) (07/09 1329) Pulse Rate:  [67-69] 68  (07/09 1500) Resp:  [16-18] 16 (07/09 1500) BP: (107-117)/(56-73) 117/56 (07/09 1500) SpO2:  [95 %-100 %] 95 % (07/09 1500) Weight:  [127 lb (57.6 kg)] 127 lb (57.6 kg) (07/09 1329)     Height: 5\' 3"  (160 cm) Weight: 127 lb (57.6 kg) BMI (Calculated): 22.5   INTAKE/OUTPUT:  This shift: No intake/output data recorded.  Last 2 shifts: @IOLAST2SHIFTS @  PHYSICAL EXAM:  Constitutional:  -- Normal body habitus  -- Awake, alert, and oriented x3  Eyes:  -- Pupils equally round and reactive to light  -- No scleral icterus  Ear, nose, throat:  -- No jugular venous distension  -- No carotid bruits appreciated  Pulmonary:  -- No crackles  -- Equal breath sounds bilaterally -- Breathing non-labored at rest Cardiovascular:  -- S1, S2 present  -- No pericardial rubs  Gastrointestinal:  -- Abdomen soft and nondistended with moderate RLQ abdominal pain, no guarding/rebound  -- No abdominal masses appreciated, pulsatile or otherwise  Musculoskeletal and Integumentary:  -- Wounds or skin discoloration: None appreciated -- Extremities: B/L UE and LE FROM, hands and feet warm, no edema  Neurologic:  -- Motor function: Intact and symmetric -- Sensation: Intact and symmetric  Labs:  CBC Latest Ref Rng & Units 12/04/2011  WBC 3.6 - 11.0 x10 3/mm 3 6.6  Hemoglobin 12.0 - 16.0 g/dL 14.3  Hematocrit 35.0 - 47.0 % 42.2  Platelets 150 - 440 x10 3/mm 3 169   CMP Latest Ref Rng & Units 12/04/2011  Glucose 65 - 99 mg/dL 88  BUN 7 - 18 mg/dL 18  Creatinine 0.60 - 1.30 mg/dL 0.84  Sodium 136 - 145 mmol/L 143  Potassium 3.5 - 5.1 mmol/L 4.1  Chloride 98 - 107 mmol/L 108(H)  CO2 21 - 32 mmol/L 31  Calcium 8.5 - 10.1 mg/dL 9.3    Imaging studies:  CT Abdomen and Pelvis with Contrast (12/03/2016) - personally reviewed with patient and her family bedside 1. Changes consistent with acute appendicitis. The stomach and small bowel loops are normal in appearance. The appendix is thickened and there is  periappendiceal fluid. Appendix measures 10 mm in thickness. No associated abscess. 2. Persistent significant emphysematous changes in the lungs. 3. Increased size of a right lower lobe nodule, now measuring 10 mm. Consider one of the following in 3 months for both low-risk and high-risk individuals: (a) repeat chest CT, (b) follow-up PET-CT, or (c) tissue sampling. This recommendation follows the consensus statement: Guidelines for Management of Incidental Pulmonary Nodules Detected on CT Images: From the Fleischner Society 2017; Radiology 2017; 284:228-243. 4. Status post hysterectomy.  Interval removal of adnexal masses. 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 6. Degenerative changes in the lumbar spine and right hip.  CT Chest (10/09/2016) - personally reviewed with patient and her family bedside 1. Lung-RADS 4A, suspicious. Follow up low-dose chest CT without contrast in 3 months (please use the following  order, "CT CHEST LCS NODULE FOLLOW-UP W/O CM") is recommended. Alternatively, PET may be considered when there is a solid component 38mm or larger. Right lower lobe pulmonary nodule which measures volume derived equivalent diameter 9.8 mm. 2. Advanced bullous type emphysema. 3.  Coronary artery atherosclerosis. Aortic atherosclerosis. These results will be called to the ordering clinician or representative by the Radiologist Assistant, and communication documented in the PACS or zVision Dashboard.  Assessment/Plan: (ICD-10's: K35.3) 66 y.o. female with acute non-perforated appendicitis, complicated by pertinent comorbidities including advanced symptomatic COPD, chronic tobacco abuse, multiple enlarged pulmonary nodules, irritable bowel syndrome, GERD, nephrolithiasis, premature menopause on HRT x 10 years, and chronic cervical spine osteoarthritis.    - NPO, IVF  - IV antibiotics   - serial abdominal exams  - considering her COPD, patient expresses she wishes to attempt  non-operative management with the understanding that she may still require surgical appendectomy if she fails to improve with worsening pain or leukocytosis  - DVT prophylaxis  All of the above findings and recommendations were discussed with the patient and her family, and all of her and family's questions were answered to their expressed satisfaction.  -- Marilynne Drivers Rosana Hoes, MD, [place]: [facility] - Partnering for exceptional care. Office: [phone]

## 2016-12-03 NOTE — ED Provider Notes (Signed)
[facility] Emergency Department Provider Note   ____________________________________________   I have reviewed the triage vital signs and the nursing notes.   HISTORY  Chief Complaint Abdominal Pain   History limited by: Not Limited   HPI NERIDA BOIVIN is a 66 y.o. female who presents to the emergency department today with concern for abdominal pain and appendicitis diagnosed on outpatient CT. The patient states for the past couple of days she has been having abdominal pain. Initially did start out periumbilical however it then localized to the right lower quadrant. She actually states that she feels better today than she did yesterday. She has not had any fevers. Has had some nausea. Has had some diarrhea, this is been nonbloody.   Past Medical History:  Diagnosis Date  . Chronic pain   . Thoracic outlet syndrome     Patient Active Problem List   Diagnosis Date Noted  . Personal history of tobacco use, presenting hazards to health 10/09/2016  . Cervicalgia 10/28/2014  . Myofacial muscle pain 10/28/2014  . Neck pain 10/28/2014    Past Surgical History:  Procedure Laterality Date  . ABDOMINAL HYSTERECTOMY    . THORACIC OUTLET SURGERY      Prior to Admission medications   Medication Sig Start Date End Date Taking? Authorizing Provider  albuterol (PROVENTIL HFA;VENTOLIN HFA) 108 (90 Base) MCG/ACT inhaler Inhale into the lungs. 04/24/16 10/21/16  [provider]  cyclobenzaprine (FLEXERIL) 10 MG tablet Take 1 tablet (10 mg total) by mouth 3 (three) times daily as needed for muscle spasms. 04/16/16   Molli Barrows, MD  docusate sodium (COLACE) 100 MG capsule Take 100 mg by mouth daily. Reported on 12/13/2015    [provider]  HYDROcodone-acetaminophen (NORCO/VICODIN) 5-325 MG tablet Take 1 tablet by mouth 2 (two) times daily. 06/12/16   Molli Barrows, MD  ibuprofen (ADVIL,MOTRIN) 200 MG tablet Take 200 mg by mouth every 6 (six) hours  as needed. Reported on 12/13/2015    [provider]  Methylcellulose, Laxative, (CITRUCEL PO) Take by mouth 2 (two) times daily. Reported on 12/13/2015    [provider]  pantoprazole (PROTONIX) 20 MG tablet Take 20 mg by mouth daily.    [provider]    Allergies Aspirin; Codeine; and Levofloxacin  Family History  Problem Relation Age of Onset  . Stroke Mother   . Vision loss Mother   . Kidney disease Father     Social History Social History  Substance Use Topics  . Smoking status: Current Every Day Smoker    Packs/day: 0.75    Years: 51.00    Types: Cigarettes  . Smokeless tobacco: Never Used     Comment: 10 cigarettes perday  . Alcohol use No    Review of Systems Constitutional: No fever/chills Eyes: No visual changes. ENT: No sore throat. Cardiovascular: Denies chest pain. Respiratory: Denies shortness of breath. Gastrointestinal: Positive for abdominal pain. Genitourinary: Negative for dysuria. Musculoskeletal: Negative for back pain. Skin: Negative for rash. Neurological: Negative for headaches, focal weakness or numbness.  ____________________________________________   PHYSICAL EXAM:  VITAL SIGNS: ED Triage Vitals  Enc Vitals Group     BP 12/03/16 1329 (!) 107/57     Pulse Rate 12/03/16 1329 67     Resp 12/03/16 1329 16     Temp 12/03/16 1329 98.1 F (36.7 C)     Temp Source 12/03/16 1329 Oral     SpO2 12/03/16 1329 97 %  Weight 12/03/16 1329 127 lb (57.6 kg)     Height 12/03/16 1329 5\' 3"  (1.6 m)     Head Circumference --      Peak Flow --      Pain Score 12/03/16 1328 0   Constitutional: Alert and oriented. Well appearing and in no distress. Eyes: Conjunctivae are normal.  ENT   Head: Normocephalic and atraumatic.   Nose: No congestion/rhinnorhea.   Mouth/Throat: Mucous membranes are moist.   Neck: No stridor. Hematological/Lymphatic/Immunilogical: No cervical lymphadenopathy. Cardiovascular:  Normal rate, regular rhythm.  No murmurs, rubs, or gallops.  Respiratory: Normal respiratory effort without tachypnea nor retractions. Breath sounds are clear and equal bilaterally. No wheezes/rales/rhonchi. Gastrointestinal: Soft and tender to palpation in the right lower quadrant. Genitourinary: Deferred Musculoskeletal: Normal range of motion in all extremities. No lower extremity edema. Neurologic:  Normal speech and language. No gross focal neurologic deficits are appreciated.  Skin:  Skin is warm, dry and intact. No rash noted. Psychiatric: Mood and affect are normal. Speech and behavior are normal. Patient exhibits appropriate insight and judgment.  ____________________________________________    LABS (pertinent positives/negatives)  Out patient labs reviewed  ____________________________________________   EKG  None  ____________________________________________    RADIOLOGY  Outpatient CT scan result reviewed  ____________________________________________   PROCEDURES  Procedures  ____________________________________________   INITIAL IMPRESSION / ASSESSMENT AND PLAN / ED COURSE  Pertinent labs & imaging results that were available during my care of the patient were reviewed by me and considered in my medical decision making (see chart for details).  Patient presented to the emergency department today because of appendicitis diagnosed on outpatient CT. Surgery was consulted.  ____________________________________________   FINAL CLINICAL IMPRESSION(S) / ED DIAGNOSES  Final diagnoses:  Appendicitis, unspecified appendicitis type     Note: This dictation was prepared with Dragon dictation. Any transcriptional errors that result from this process are unintentional     Nance Pear, MD 12/03/16 1430

## 2016-12-03 NOTE — ED Triage Notes (Signed)
This RN was called by patient's PCP and told that patient's CT scanned showed that patient has acute appendicitis.  Patient is in no obvious distress at this time.

## 2016-12-04 DIAGNOSIS — K353 Acute appendicitis with localized peritonitis: Secondary | ICD-10-CM | POA: Diagnosis not present

## 2016-12-04 LAB — BASIC METABOLIC PANEL
Anion gap: 5 (ref 5–15)
BUN: 11 mg/dL (ref 6–20)
CHLORIDE: 106 mmol/L (ref 101–111)
CO2: 28 mmol/L (ref 22–32)
CREATININE: 0.87 mg/dL (ref 0.44–1.00)
Calcium: 8.6 mg/dL — ABNORMAL LOW (ref 8.9–10.3)
GFR calc Af Amer: >60 mL/min (ref >60)
GFR calc non Af Amer: >60 mL/min (ref >60)
Glucose, Bld: 113 mg/dL — ABNORMAL HIGH (ref 65–99)
Potassium: 3.6 mmol/L (ref 3.5–5.1)
Sodium: 139 mmol/L (ref 135–145)

## 2016-12-04 LAB — CBC
HCT: 39.5 % (ref 35.0–47.0)
Hemoglobin: 13.5 g/dL (ref 12.0–16.0)
MCH: 30.5 pg (ref 26.0–34.0)
MCHC: 34.3 g/dL (ref 32.0–36.0)
MCV: 89.1 fL (ref 80.0–100.0)
PLATELETS: 166 10*3/uL (ref 150–440)
RBC: 4.43 MIL/uL (ref 3.80–5.20)
RDW: 12.7 % (ref 11.5–14.5)
WBC: 4.8 10*3/uL (ref 3.6–11.0)

## 2016-12-04 MED ORDER — AMOXICILLIN-POT CLAVULANATE 875-125 MG PO TABS: 1.0000 | ORAL_TABLET | Freq: Two times a day (BID) | ORAL | 0 refills | Status: AC

## 2016-12-04 MED ORDER — DIAZEPAM 5 MG PO TABS
2.5000 mg | ORAL_TABLET | Freq: Two times a day (BID) | ORAL | Status: DC | PRN
  Administered 2016-12-04: 2.5 mg via ORAL
  Filled 2016-12-04: qty 1

## 2016-12-04 MED ORDER — ALBUTEROL SULFATE (2.5 MG/3ML) 0.083% IN NEBU: 2.5000 mg | INHALATION_SOLUTION | RESPIRATORY_TRACT | Status: DC | PRN

## 2016-12-04 MED ORDER — ALBUTEROL SULFATE HFA 108 (90 BASE) MCG/ACT IN AERS: 1.0000 | INHALATION_SPRAY | RESPIRATORY_TRACT | Status: DC | PRN

## 2016-12-04 NOTE — Progress Notes (Signed)
SURGICAL PROGRESS NOTE (cpt [phone])  Hospital Day(s): 0.   Post op day(s):  Marland Kitchen   Interval History: Patient seen and examined, no acute events or new complaints overnight. Patient reports abdominal pain has nearly completely resolved with +flatus, and patient denies any N/V, fever/chills, CP, or SOB and requests to be able to eat.  Review of Systems:  Constitutional: denies fever, chills  HEENT: denies cough or congestion  Respiratory: denies any shortness of breath  Cardiovascular: denies chest pain or palpitations  Gastrointestinal: abdominal pain, N/V, and bowel function as per interval history Genitourinary: denies burning with urination or urinary frequency Musculoskeletal: denies pain, decreased motor or sensation Integumentary: denies any other rashes or skin discolorations Neurological: denies HA or vision/hearing changes   Vital signs in last 24 hours: [min-max] current  Temp:  [98.1 F (36.7 C)-98.2 F (36.8 C)] 98.2 F (36.8 C) (07/10 0435) Pulse Rate:  [59-69] 65 (07/10 0435) Resp:  [15-18] 16 (07/10 0435) BP: (100-121)/(52-73) 100/52 (07/10 0435) SpO2:  [93 %-100 %] 95 % (07/10 0435) Weight:  [127 lb (57.6 kg)] 127 lb (57.6 kg) (07/09 1329)     Height: 5\' 3"  (160 cm) Weight: 127 lb (57.6 kg) BMI (Calculated): 22.5   Intake/Output this shift:  No intake/output data recorded.   Intake/Output last 2 shifts:  @IOLAST2SHIFTS @   Physical Exam:  Constitutional: alert, cooperative and no distress  HENT: normocephalic without obvious abnormality  Eyes: PERRL, EOM's grossly intact and symmetric  Neuro: CN II - XII grossly intact and symmetric without deficit  Respiratory: breathing non-labored at rest  Cardiovascular: regular rate and sinus rhythm  Gastrointestinal: soft, minimal RLQ abdominal tenderness to deep palpation, and non-distended  Musculoskeletal: UE and LE FROM, no edema or wounds, motor and sensation grossly intact, NT   Labs:  CBC Latest Ref Rng &  Units 12/04/2016 12/04/2011  WBC 3.6 - 11.0 K/uL 4.8 6.6  Hemoglobin 12.0 - 16.0 g/dL 13.5 14.3  Hematocrit 35.0 - 47.0 % 39.5 42.2  Platelets 150 - 440 K/uL 166 169   CMP Latest Ref Rng & Units 12/04/2016 12/04/2011  Glucose 65 - 99 mg/dL 113(H) 88  BUN 6 - 20 mg/dL 11 18  Creatinine 0.44 - 1.00 mg/dL 0.87 0.84  Sodium 135 - 145 mmol/L 139 143  Potassium 3.5 - 5.1 mmol/L 3.6 4.1  Chloride 101 - 111 mmol/L 106 108(H)  CO2 22 - 32 mmol/L 28 31  Calcium 8.9 - 10.3 mg/dL 8.6(L) 9.3   Imaging studies: No new pertinent imaging studies  Assessment/Plan: (ICD-10's: K35.3) 66 y.o. female doing well with currently asymptomatic with acute non-perforated appendicitis, complicated by pertinent comorbidities including advanced symptomatic COPD, chronic tobacco abuse, multiple enlarged pulmonary nodules, irritable bowel syndrome, GERD, nephrolithiasis, premature menopause on HRT x 10 years, and chronic cervical spine osteoarthritis.              - IV antibiotics              - serial abdominal exams  - advance to clear liquids diet and then gradually as tolerated              - if tolerates diet and ambulation without pain, discharge planning  - convert to 7-day course of oral antibiotics at discharge             - DVT prophylaxis  All of the above findings and recommendations were discussed with the patient and her family, and all of her and family's questions were answered  to their expressed satisfaction.  -- Marilynne Drivers Rosana Hoes, MD, [place]: [facility] - Partnering for exceptional care. Office: [phone]

## 2016-12-04 NOTE — Care Management Obs Status (Signed)
[facility] NOTIFICATION   Patient Details  Name: Holly Werner MRN: [id number] Date of Birth: 13-Nov-1950   Medicare Observation Status Notification Given:  Yes    Beverly Sessions, RN 12/04/2016, 4:39 PM

## 2016-12-04 NOTE — Discharge Instructions (Signed)
In addition to included general instructions for Appendicitis,  Diet: Gradually resume home heart healthy diet.   Activity: Light activity and walking are encouraged.  Medications: Complete course of prescribed oral antibiotics even if you are feeling better. Otherwise, resume all home medications. For mild to moderate pain: acetaminophen (Tylenol) or ibuprofen (if no kidney disease).  Call office [phone]) at any time if any questions, worsening pain, fevers/chills, bleeding, drainage from incision site, or other concerns.

## 2016-12-04 NOTE — Progress Notes (Signed)
Patient discharged home with son. IV removed without complications or discomfort. Instructions and follow up appointments reviewed. Prescription sent to pharmacy electronically. Patient made aware.

## 2016-12-04 NOTE — Progress Notes (Signed)
Patient c/o anxiety related to lack of sleep during hospitalization. Discussed with patient techniques for calming and plan to cluster care this shift. Patient agreed to also discuss with MD during rounds for sleep aid.

## 2016-12-05 ENCOUNTER — Inpatient Hospital Stay: Payer: Medicare Other | Admitting: Anesthesiology

## 2016-12-05 ENCOUNTER — Encounter: Payer: Self-pay | Admitting: Surgery

## 2016-12-05 ENCOUNTER — Encounter
Admission: AD | Disposition: A | Discharge status: Home / Self Care | Payer: Self-pay | Source: Ambulatory Visit | Attending: Surgery

## 2016-12-05 ENCOUNTER — Telehealth: Payer: Self-pay

## 2016-12-05 ENCOUNTER — Inpatient Hospital Stay
Admission: AD | Admit: 2016-12-05 | Discharge: 2016-12-06 | DRG: 339 | Disposition: A | Discharge status: Home / Self Care | Payer: Medicare Other | Source: Ambulatory Visit | Attending: Surgery | Admitting: Surgery

## 2016-12-05 ENCOUNTER — Ambulatory Visit (INDEPENDENT_AMBULATORY_CARE_PROVIDER_SITE_OTHER): Payer: Medicare Other | Admitting: Surgery

## 2016-12-05 VITALS — BP 109/66 | HR 78 | Temp 97.8°F | Ht 63.5 in | Wt 127.0 lb

## 2016-12-05 DIAGNOSIS — K353 Acute appendicitis with localized peritonitis, without perforation or gangrene: Secondary | ICD-10-CM | POA: Diagnosis present

## 2016-12-05 DIAGNOSIS — J449 Chronic obstructive pulmonary disease, unspecified: Secondary | ICD-10-CM | POA: Diagnosis not present

## 2016-12-05 DIAGNOSIS — E28319 Asymptomatic premature menopause: Secondary | ICD-10-CM | POA: Insufficient documentation

## 2016-12-05 DIAGNOSIS — M4692 Unspecified inflammatory spondylopathy, cervical region: Secondary | ICD-10-CM | POA: Diagnosis not present

## 2016-12-05 DIAGNOSIS — J439 Emphysema, unspecified: Secondary | ICD-10-CM | POA: Diagnosis not present

## 2016-12-05 DIAGNOSIS — N200 Calculus of kidney: Secondary | ICD-10-CM | POA: Insufficient documentation

## 2016-12-05 DIAGNOSIS — Z886 Allergy status to analgesic agent status: Secondary | ICD-10-CM | POA: Diagnosis not present

## 2016-12-05 DIAGNOSIS — K37 Unspecified appendicitis: Secondary | ICD-10-CM | POA: Diagnosis present

## 2016-12-05 DIAGNOSIS — Z79899 Other long term (current) drug therapy: Secondary | ICD-10-CM | POA: Diagnosis not present

## 2016-12-05 DIAGNOSIS — K589 Irritable bowel syndrome without diarrhea: Secondary | ICD-10-CM | POA: Insufficient documentation

## 2016-12-05 DIAGNOSIS — Z881 Allergy status to other antibiotic agents status: Secondary | ICD-10-CM | POA: Diagnosis not present

## 2016-12-05 DIAGNOSIS — Z885 Allergy status to narcotic agent status: Secondary | ICD-10-CM | POA: Diagnosis not present

## 2016-12-05 DIAGNOSIS — Z888 Allergy status to other drugs, medicaments and biological substances status: Secondary | ICD-10-CM | POA: Diagnosis not present

## 2016-12-05 DIAGNOSIS — R911 Solitary pulmonary nodule: Secondary | ICD-10-CM | POA: Insufficient documentation

## 2016-12-05 DIAGNOSIS — I38 Endocarditis, valve unspecified: Secondary | ICD-10-CM | POA: Diagnosis not present

## 2016-12-05 DIAGNOSIS — F17200 Nicotine dependence, unspecified, uncomplicated: Secondary | ICD-10-CM | POA: Diagnosis not present

## 2016-12-05 DIAGNOSIS — M47812 Spondylosis without myelopathy or radiculopathy, cervical region: Secondary | ICD-10-CM | POA: Insufficient documentation

## 2016-12-05 DIAGNOSIS — K219 Gastro-esophageal reflux disease without esophagitis: Secondary | ICD-10-CM | POA: Insufficient documentation

## 2016-12-05 HISTORY — PX: LAPAROSCOPIC APPENDECTOMY: SHX408

## 2016-12-05 LAB — GLUCOSE, CAPILLARY: GLUCOSE-CAPILLARY: 112 mg/dL — AB (ref 65–99)

## 2016-12-05 LAB — SURGICAL PCR SCREEN
MRSA, PCR: NEGATIVE
Staphylococcus aureus: NEGATIVE

## 2016-12-05 SURGERY — APPENDECTOMY, LAPAROSCOPIC
Anesthesia: General | Site: Abdomen | Wound class: Clean Contaminated

## 2016-12-05 MED ORDER — FENTANYL CITRATE (PF) 100 MCG/2ML IJ SOLN
INTRAMUSCULAR | Status: AC
  Administered 2016-12-05: 25 ug via INTRAVENOUS
  Filled 2016-12-05: qty 2

## 2016-12-05 MED ORDER — DEXAMETHASONE SODIUM PHOSPHATE 10 MG/ML IJ SOLN
INTRAMUSCULAR | Status: AC
  Filled 2016-12-05: qty 1

## 2016-12-05 MED ORDER — TRAMADOL HCL 50 MG PO TABS
50.0000 mg | ORAL_TABLET | Freq: Four times a day (QID) | ORAL | Status: DC | PRN
  Administered 2016-12-05 – 2016-12-06 (×4): 50 mg via ORAL
  Filled 2016-12-05 (×4): qty 1

## 2016-12-05 MED ORDER — LACTATED RINGERS IV SOLN
INTRAVENOUS | Status: DC
  Administered 2016-12-05: 16:00:00 via INTRAVENOUS

## 2016-12-05 MED ORDER — FENTANYL CITRATE (PF) 100 MCG/2ML IJ SOLN
25.0000 ug | INTRAMUSCULAR | Status: DC | PRN
  Administered 2016-12-05 (×2): 25 ug via INTRAVENOUS

## 2016-12-05 MED ORDER — LIDOCAINE HCL 1 % IJ SOLN
INTRAMUSCULAR | Status: DC | PRN
  Administered 2016-12-05: 15 mL via INTRADERMAL

## 2016-12-05 MED ORDER — HEPARIN SODIUM (PORCINE) 5000 UNIT/ML IJ SOLN: 5000.0000 [IU] | Freq: Three times a day (TID) | INTRAMUSCULAR | Status: DC

## 2016-12-05 MED ORDER — ONDANSETRON HCL 4 MG/2ML IJ SOLN
INTRAMUSCULAR | Status: DC | PRN
  Administered 2016-12-05: 4 mg via INTRAVENOUS

## 2016-12-05 MED ORDER — ONDANSETRON HCL 4 MG/2ML IJ SOLN
INTRAMUSCULAR | Status: AC
  Filled 2016-12-05: qty 2

## 2016-12-05 MED ORDER — MIDAZOLAM HCL 2 MG/2ML IJ SOLN
INTRAMUSCULAR | Status: AC
  Filled 2016-12-05: qty 2

## 2016-12-05 MED ORDER — SUCCINYLCHOLINE CHLORIDE 20 MG/ML IJ SOLN
INTRAMUSCULAR | Status: AC
  Filled 2016-12-05: qty 1

## 2016-12-05 MED ORDER — KCL IN DEXTROSE-NACL 20-5-0.45 MEQ/L-%-% IV SOLN
INTRAVENOUS | Status: DC
  Administered 2016-12-05 – 2016-12-06 (×2): via INTRAVENOUS
  Filled 2016-12-05 (×4): qty 1000

## 2016-12-05 MED ORDER — SUGAMMADEX SODIUM 200 MG/2ML IV SOLN
INTRAVENOUS | Status: DC | PRN
  Administered 2016-12-05: 120 mg via INTRAVENOUS

## 2016-12-05 MED ORDER — PROPOFOL 10 MG/ML IV BOLUS
INTRAVENOUS | Status: AC
  Filled 2016-12-05: qty 20

## 2016-12-05 MED ORDER — MIDAZOLAM HCL 5 MG/5ML IJ SOLN
INTRAMUSCULAR | Status: DC | PRN
  Administered 2016-12-05: 2 mg via INTRAVENOUS

## 2016-12-05 MED ORDER — CHLORHEXIDINE GLUCONATE 4 % EX LIQD: 60.0000 mL | Freq: Once | CUTANEOUS | Status: DC

## 2016-12-05 MED ORDER — DEXTROSE 5 % IV SOLN
2.0000 g | INTRAVENOUS | Status: DC
  Administered 2016-12-05: 2 g via INTRAVENOUS
  Filled 2016-12-05 (×2): qty 2

## 2016-12-05 MED ORDER — PROPOFOL 10 MG/ML IV BOLUS
INTRAVENOUS | Status: DC | PRN
  Administered 2016-12-05: 150 mg via INTRAVENOUS

## 2016-12-05 MED ORDER — METRONIDAZOLE IN NACL 5-0.79 MG/ML-% IV SOLN
500.0000 mg | Freq: Three times a day (TID) | INTRAVENOUS | Status: DC
  Administered 2016-12-05 – 2016-12-06 (×2): 500 mg via INTRAVENOUS
  Filled 2016-12-05 (×4): qty 100

## 2016-12-05 MED ORDER — ONDANSETRON 4 MG PO TBDP: 4.0000 mg | ORAL_TABLET | Freq: Four times a day (QID) | ORAL | Status: DC | PRN

## 2016-12-05 MED ORDER — PIPERACILLIN-TAZOBACTAM 3.375 G IVPB
3.3750 g | Freq: Three times a day (TID) | INTRAVENOUS | Status: DC
  Administered 2016-12-05: 3.375 g via INTRAVENOUS
  Filled 2016-12-05: qty 50

## 2016-12-05 MED ORDER — ONDANSETRON HCL 4 MG/2ML IJ SOLN: 4.0000 mg | Freq: Four times a day (QID) | INTRAMUSCULAR | Status: DC | PRN

## 2016-12-05 MED ORDER — FENTANYL CITRATE (PF) 100 MCG/2ML IJ SOLN
INTRAMUSCULAR | Status: AC
  Filled 2016-12-05: qty 2

## 2016-12-05 MED ORDER — PHENYLEPHRINE HCL 10 MG/ML IJ SOLN
INTRAMUSCULAR | Status: DC | PRN
  Administered 2016-12-05: 80 ug via INTRAVENOUS

## 2016-12-05 MED ORDER — DEXAMETHASONE SODIUM PHOSPHATE 10 MG/ML IJ SOLN
INTRAMUSCULAR | Status: DC | PRN
  Administered 2016-12-05: 10 mg via INTRAVENOUS

## 2016-12-05 MED ORDER — ONDANSETRON HCL 4 MG/2ML IJ SOLN: 4.0000 mg | Freq: Once | INTRAMUSCULAR | Status: DC | PRN

## 2016-12-05 MED ORDER — ROCURONIUM BROMIDE 100 MG/10ML IV SOLN
INTRAVENOUS | Status: DC | PRN
  Administered 2016-12-05: 20 mg via INTRAVENOUS

## 2016-12-05 MED ORDER — BUPIVACAINE HCL (PF) 0.5 % IJ SOLN
INTRAMUSCULAR | Status: AC
  Filled 2016-12-05: qty 30

## 2016-12-05 MED ORDER — SUGAMMADEX SODIUM 200 MG/2ML IV SOLN
INTRAVENOUS | Status: AC
  Filled 2016-12-05: qty 2

## 2016-12-05 MED ORDER — FENTANYL CITRATE (PF) 100 MCG/2ML IJ SOLN
INTRAMUSCULAR | Status: DC | PRN
  Administered 2016-12-05: 50 ug via INTRAVENOUS
  Administered 2016-12-05 (×2): 25 ug via INTRAVENOUS
  Administered 2016-12-05 (×2): 50 ug via INTRAVENOUS

## 2016-12-05 MED ORDER — LIDOCAINE HCL (PF) 1 % IJ SOLN
INTRAMUSCULAR | Status: AC
  Filled 2016-12-05: qty 30

## 2016-12-05 MED ORDER — SUCCINYLCHOLINE CHLORIDE 20 MG/ML IJ SOLN
INTRAMUSCULAR | Status: DC | PRN
  Administered 2016-12-05: 100 mg via INTRAVENOUS

## 2016-12-05 MED ORDER — MORPHINE SULFATE (PF) 2 MG/ML IV SOLN: 2.0000 mg | INTRAVENOUS | Status: DC | PRN

## 2016-12-05 MED ORDER — ACETAMINOPHEN 325 MG PO TABS: 650.0000 mg | ORAL_TABLET | Freq: Four times a day (QID) | ORAL | Status: DC | PRN

## 2016-12-05 MED ORDER — ROCURONIUM BROMIDE 50 MG/5ML IV SOLN
INTRAVENOUS | Status: AC
  Filled 2016-12-05: qty 1

## 2016-12-05 MED ORDER — ENOXAPARIN SODIUM 40 MG/0.4ML ~~LOC~~ SOLN
40.0000 mg | SUBCUTANEOUS | Status: DC
  Administered 2016-12-06: 40 mg via SUBCUTANEOUS
  Filled 2016-12-05: qty 0.4

## 2016-12-05 MED ORDER — FENTANYL CITRATE (PF) 100 MCG/2ML IJ SOLN: 50.0000 ug | INTRAMUSCULAR | Status: DC | PRN

## 2016-12-05 SURGICAL SUPPLY — 37 items
CANISTER SUCT 3000ML PPV (MISCELLANEOUS) ×3 IMPLANT
CHLORAPREP W/TINT 26ML (MISCELLANEOUS) ×3 IMPLANT
CUTTER FLEX LINEAR 45M (STAPLE) ×3 IMPLANT
DECANTER SPIKE VIAL GLASS SM (MISCELLANEOUS) ×3 IMPLANT
DEFOGGER SCOPE WARMER CLEARIFY (MISCELLANEOUS) ×3 IMPLANT
DERMABOND ADVANCED (GAUZE/BANDAGES/DRESSINGS) ×2
DERMABOND ADVANCED .7 DNX12 (GAUZE/BANDAGES/DRESSINGS) ×1 IMPLANT
DEVICE TROCAR PUNCTURE CLOSURE (ENDOMECHANICALS) ×3 IMPLANT
ELECT REM PT RETURN 9FT ADLT (ELECTROSURGICAL) ×3
ELECTRODE REM PT RTRN 9FT ADLT (ELECTROSURGICAL) ×1 IMPLANT
FILTER LAP SMOKE EVAC STRL (MISCELLANEOUS) ×3 IMPLANT
GLOVE BIO SURGEON STRL SZ7 (GLOVE) ×3 IMPLANT
GLOVE BIOGEL PI IND STRL 7.5 (GLOVE) ×1 IMPLANT
GLOVE BIOGEL PI INDICATOR 7.5 (GLOVE) ×2
GOWN STRL REUS W/ TWL XL LVL3 (GOWN DISPOSABLE) ×1 IMPLANT
GOWN STRL REUS W/TWL LRG LVL3 (GOWN DISPOSABLE) ×3 IMPLANT
GOWN STRL REUS W/TWL XL LVL3 (GOWN DISPOSABLE) ×2
IRRIGATION STRYKERFLOW (MISCELLANEOUS) ×1 IMPLANT
IRRIGATOR STRYKERFLOW (MISCELLANEOUS) ×3
KIT RM TURNOVER STRD PROC AR (KITS) ×3 IMPLANT
NEEDLE INSUFFLATION 14GA 120MM (NEEDLE) ×3 IMPLANT
NS IRRIG 1000ML POUR BTL (IV SOLUTION) ×3 IMPLANT
PACK LAP CHOLECYSTECTOMY (MISCELLANEOUS) ×3 IMPLANT
POUCH SPECIMEN RETRIEVAL 10MM (ENDOMECHANICALS) ×3 IMPLANT
RELOAD 45 VASCULAR/THIN (ENDOMECHANICALS) IMPLANT
RELOAD STAPLE TA45 3.5 REG BLU (ENDOMECHANICALS) IMPLANT
SHEARS HARMONIC ACE PLUS 36CM (ENDOMECHANICALS) ×3 IMPLANT
SLEEVE ENDOPATH XCEL 5M (ENDOMECHANICALS) ×3 IMPLANT
SOL .9 NS 3000ML IRR  AL (IV SOLUTION)
SOL .9 NS 3000ML IRR UROMATIC (IV SOLUTION) IMPLANT
SUT MNCRL AB 4-0 PS2 18 (SUTURE) ×3 IMPLANT
SUT VICRYL 0 UR6 27IN ABS (SUTURE) ×3 IMPLANT
SUT VICRYL AB 3-0 FS1 BRD 27IN (SUTURE) ×3 IMPLANT
TRAY FOLEY W/METER SILVER 16FR (SET/KITS/TRAYS/PACK) ×3 IMPLANT
TROCAR XCEL 12X100 BLDLESS (ENDOMECHANICALS) ×3 IMPLANT
TROCAR XCEL NON-BLD 5MMX100MML (ENDOMECHANICALS) ×3 IMPLANT
TUBING INSUFFLATION (TUBING) ×3 IMPLANT

## 2016-12-05 NOTE — Progress Notes (Signed)
Patient c/o being tense and "drawing up"; family at bedside talking about patient being "nervous, anxious"; Encouraged family to not "talk" about the anxiousness;  Encouraged patient to C/DB and relax. Barbaraann Faster, RN 8:32 PM 12/05/2016

## 2016-12-05 NOTE — Progress Notes (Signed)
Patient seen and examined, reports abdominal pain has continued to improve since discharge, but diarrhea and nausea with emesis have worsened. Patient scheduled for laparoscopic appendectomy today, will also check C. Diff for completeness. All risks, benefits, and alternatives to appendectomy were discussed with the patient and her family, all of their questions were answered their expressed satisfaction, patient expresses she wishes to proceed, and informed consent was obtained.  -- Marilynne Drivers Rosana Hoes, MD, [place]: [facility] - Partnering for exceptional care. Office: [phone]

## 2016-12-05 NOTE — Progress Notes (Signed)
66 year old female by my partner Dr. Rosana Hoes couple days ago for appendicitis. Patient elected to have medical therapy in the form of antibiotics. She came to the office because of complaints of abdominal pain, nausea and diarrhea. She reports a lot of gas pain. She is nontoxic she did have some crackers at around 9:45 in the morning. Denies any fevers or chills. CT scan personal review there is no question of acute appendicitis. There is no abscess and there is no free air.  PE NAD Abd: soft, mildly TTP RLQ, no peritonitis  A/p Appendicitis w failed medical rx Recommend appendectomy Admit to the Hospital and schedule appendectomy D/W Dr. Rosana Hoes in detail I spent 25 min in this encounter w > 50% spent in coordinating and counseling

## 2016-12-05 NOTE — Anesthesia Post-op Follow-up Note (Cosign Needed)
Anesthesia QCDR form completed.        

## 2016-12-05 NOTE — Progress Notes (Addendum)
Patient sent to the OR via bed. Family with the patient

## 2016-12-05 NOTE — Interval H&P Note (Signed)
History and Physical Interval Note:  12/05/2016 8:43 PM  Holly Werner  has presented today for surgery, with the diagnosis of Appendicitis  The various methods of treatment have been discussed with the patient and family. After consideration of risks, benefits and other options for treatment, the patient has consented to  Procedure(s): APPENDECTOMY LAPAROSCOPIC (N/A) as a surgical intervention .  The patient's history has been reviewed, patient examined, no change in status, stable for surgery.  I have reviewed the patient's chart and labs.  Questions were answered to the patient's satisfaction.     Vickie Epley

## 2016-12-05 NOTE — Transfer of Care (Signed)
Immediate Anesthesia Transfer of Care Note  Patient: Holly Werner  Procedure(s) Performed: Procedure(s): APPENDECTOMY LAPAROSCOPIC (N/A)  Patient Location: PACU  Anesthesia Type:General  Level of Consciousness: sedated and patient cooperative  Airway & Oxygen Therapy: Patient Spontanous Breathing and Patient connected to nasal cannula oxygen  Post-op Assessment: Report given to RN and Post -op Vital signs reviewed and stable  Post vital signs: Reviewed and stable  Last Vitals:  Vitals:   12/05/16 1305 12/05/16 1921  BP: (!) 122/45   Pulse: 67   Resp: 20 (P) 17  Temp: 36.5 C (P) 36.8 C    Last Pain:  Vitals:   12/05/16 1921  TempSrc:   PainSc: (P) 6          Complications: No apparent anesthesia complications

## 2016-12-05 NOTE — Progress Notes (Signed)
Pharmacy Antibiotic Note  Holly Werner is a 66 y.o. female admitted on 12/05/2016 with abdominal pain with recent diagnosis of appendicitis who opted for non-operative management.  Pharmacy has been consulted for Zosyn dosing. Patient received Zosyn 7/9-7/10 and was discharged on Augmentin 7/10.  Plan: Zosyn 3.375g IV q8h (4 hour infusion).  Height: 5' 3.5" (161.3 cm) Weight: 128 lb 12.8 oz (58.4 kg) IBW/kg (Calculated) : 53.55  Temp (24hrs), Avg:97.8 F (36.6 C), Min:97.7 F (36.5 C), Max:97.8 F (36.6 C)   Recent Labs Lab 12/04/16 0439  WBC 4.8  CREATININE 0.87    Estimated Creatinine Clearance: 52.4 mL/min (by C-G formula based on SCr of 0.87 mg/dL).    Allergies  Allergen Reactions  . Escitalopram Oxalate Palpitations    Loose stools  . Aspirin Other (See Comments)    Stomach hurt  . Codeine Other (See Comments)    stomach hurt  . Levofloxacin     Other reaction(s): Dizziness    Antimicrobials this admission: Zosyn 7/11 >>   Dose adjustments this admission:  Microbiology results: C diff: pending  Thank you for allowing pharmacy to be a part of this patient's care.  Ulice Dash D 12/05/2016 2:14 PM

## 2016-12-05 NOTE — Telephone Encounter (Signed)
Patient was seen in the ED by Dr. Rosana Hoes on 12/04/2016. She was sent home 12/04/2016. She came home, went to bed, got up around 5 am this morning nauseated. She then began to throw up. It looked like she was eating a cherry popsicle. She had watery diarrhea twice. She says she has a lot of gas in her stomach. She took her Augmentin last night. Does he want her to keep taking that? What should she do? Please call patient and advice.

## 2016-12-05 NOTE — Progress Notes (Signed)
Dr Rosana Hoes is rounding on the patient and is requesting c diff to be drawn

## 2016-12-05 NOTE — Patient Instructions (Signed)
We are admitting you to the hospital and you will be having surgery later today. Please DO NOT eat or drink anything.

## 2016-12-05 NOTE — Op Note (Signed)
SURGICAL OPERATIVE REPORT  DATE OF PROCEDURE: 12/05/2016  ATTENDING Surgeon(s): Vickie Epley, MD  ANESTHESIA: GETA (General)  PRE-OPERATIVE DIAGNOSIS: Acute non-perforated appendicitis with localized peritonitis (K35.3)  POST-OPERATIVE DIAGNOSIS: Acute non-perforated appendicitis with localized peritonitis (K35.3)  PROCEDURE(S):  1.) Laparoscopic appendectomy (cpt: 32992)  INTRAOPERATIVE FINDINGS: Moderately-severely inflamed non-perforated appendix without surrounding ascites  INTRAVENOUS FLUIDS: 500 mL crystalloid   ESTIMATED BLOOD LOSS: Minimal (<20 mL)  URINE OUTPUT: 100 mL   SPECIMENS: Appendix  IMPLANTS: None  DRAINS: None  COMPLICATIONS: None apparent  CONDITION AT END OF PROCEDURE: Hemodynamically stable and extubated  DISPOSITION OF PATIENT: PACU  INDICATIONS FOR PROCEDURE:  Patient is a 66 y.o. Female recently treated for acute appendicitis with antibiotics alone due to her COPD. Today, however, patient called and presented to the office for reassessment due to multiple loose BM's and nausea with an episode of emesis. She otherwise reported mild RLQ abdominal pain continuing to improve. Patient was readmitted for appendectomy, and patient was confirmed not to have C Diff. All risks, benefits, and alternatives to above procedure were discussed with the patient, all of patient's questions were answered to her expressed satisfaction, and informed consent was accordingly obtained and documented.  DETAILS OF PROCEDURE: Patient was brought to the operating suite and appropriately identified. General anesthesia was administered along with confirmation of appropriate pre-operative antibiotics, and endotracheal intubation was performed by anesthetist, along with NG/OG tube for gastric decompression. In supine position, operative site was prepped and draped in usual sterile fashion, and following a brief time out, initial 5 mm incision was made in a natural skin crease  just below the umbilicus. Fascia was then elevated, and a Verress needle was inserted and its proper position confirmed using saline meniscus test prior to abdominal insufflation.  Upon insufflation of the abdominal cavity with carbon dioxide to a well-tolerated pressure of 12-15 mmHg, a 5 mm peri-umbilical port followed by laparoscope were inserted and used to inspect the abdominal cavity and its contents with no injuries from insertion of the first trochar noted. Two additional trocars were inserted, a 12 mm port at the Left lower quadrant position and another 5 mm port at the suprapubic position. The table was then placed in Trendelenburg position with the Right side up, and blunt graspers were gently used to retract the bowel overlying a clearly inflamed appendix with extensive periappendiceal inflammation and encapsulation by adjacent epiploic fat pads without any surrounding ascites. The appendix was gently retracted by near its tip, and the base of the appendix and mesoappendix were identified in relation to the cecum. The mesoappendix was dissected from the visceral appendix and hemostasis achieved using a harmonic scalpel. Upon freeing the visceral appendix from the mesoappendix, an endostapler loaded with a standard (blue) tissue load was advanced across the base of the visceral appendix, which was compressed for several seconds, and the stapler was deployed and removed from the abdominal cavity. Hemostasis was confirmed, and the specimen was extracted from the abdominal cavity in a laparoscopic specimen bag.  The intraperitoneal cavity was inspected with no additional findings. PMI laparoscopic fascial closure device was then used to re-approximate fascia at the 12 mm Left lower quadrant port site. All ports were then removed under direct visualization, and the abdominal cavity was desuflated. All port sites were irrigated/cleaned, additional local anesthetic was injected at each incision, 3-0 Vicryl  was used to re-approximate dermis at 10/12 mm port site(s), and subcuticular 4-0 Monocryl suture was used to re-approximate skin. Skin was then cleaned,  dried, and sterile skin glue was applied. Patient was then safely able to be awakened, extubated, and transferred to PACU for post-operative monitoring and care.   I was present for all aspects of procedure, and there were no intra-operative complications apparent.

## 2016-12-05 NOTE — H&P (View-Only) (Signed)
66 year old female by my partner Dr. Rosana Hoes couple days ago for appendicitis. Patient elected to have medical therapy in the form of antibiotics. She came to the office because of complaints of abdominal pain, nausea and diarrhea. She reports a lot of gas pain. She is nontoxic she did have some crackers at around 9:45 in the morning. Denies any fevers or chills. CT scan personal review there is no question of acute appendicitis. There is no abscess and there is no free air.  PE NAD Abd: soft, mildly TTP RLQ, no peritonitis  A/p Appendicitis w failed medical rx Recommend appendectomy Admit to the Hospital and schedule appendectomy D/W Dr. Rosana Hoes in detail I spent 25 min in this encounter w > 50% spent in coordinating and counseling

## 2016-12-05 NOTE — Telephone Encounter (Signed)
Call returned to patient at this time. Patient states that she is having abdominal pain to RLQ more constant than since leaving the hospital but same intensity. She denies pain elsewhere in the abdomen. She did have 1 episode of vomiting and violent dry heaves this am. The episode of vomit did not appear to be blood but was a bright red in color. Patient states that she has not eaten or drank anything today that would cause this to be discolored. Yesterday had cherry icee at 10am. + Chills,  but no fever. Has taken temp multiple times and tmax was 97.8 tympanic. 2 episodes of diarrhea- watery. Denies any problems with urination. Has taken 1 Augmentin since leaving the hospital but does not feel as though she can keep it down this am.  Will speak with Dr. Rosana Hoes in regards to this patient and return phone call to patient with plan.

## 2016-12-05 NOTE — Anesthesia Procedure Notes (Signed)
Procedure Name: Intubation Date/Time: 12/05/2016 6:02 PM Performed by: Lendon Colonel Pre-anesthesia Checklist: Patient identified, Patient being monitored, Timeout performed, Emergency Drugs available and Suction available Patient Re-evaluated:Patient Re-evaluated prior to induction Oxygen Delivery Method: Circle system utilized Preoxygenation: Pre-oxygenation with 100% oxygen Induction Type: IV induction, Rapid sequence and Cricoid Pressure applied Ventilation: Mask ventilation without difficulty Laryngoscope Size: Miller and 2 Grade View: Grade I Tube type: Oral Tube size: 7.0 mm Number of attempts: 1 Airway Equipment and Method: Stylet Placement Confirmation: ETT inserted through vocal cords under direct vision,  positive ETCO2 and breath sounds checked- equal and bilateral Secured at: 21 cm Tube secured with: Tape Dental Injury: Teeth and Oropharynx as per pre-operative assessment

## 2016-12-05 NOTE — Anesthesia Preprocedure Evaluation (Signed)
Anesthesia Evaluation  Patient identified by MRN, date of birth, ID band Patient awake    Reviewed: Allergy & Precautions, NPO status , Patient's Chart, lab work & pertinent test results  History of Anesthesia Complications Negative for: history of anesthetic complications  Airway Mallampati: II       Dental   Pulmonary COPD,  COPD inhaler, Current Smoker,           Cardiovascular negative cardio ROS  + Valvular Problems/Murmurs (murmur with pregnancy 37 yrs ago, no tx)      Neuro/Psych negative neurological ROS     GI/Hepatic Neg liver ROS, GERD  Poorly Controlled,  Endo/Other  negative endocrine ROS  Renal/GU Renal disease (stones)     Musculoskeletal   Abdominal   Peds  Hematology negative hematology ROS (+)   Anesthesia Other Findings   Reproductive/Obstetrics                             Anesthesia Physical Anesthesia Plan  ASA: III and emergent  Anesthesia Plan: General   Post-op Pain Management:    Induction: Intravenous  PONV Risk Score and Plan: 3 and Ondansetron, Dexamethasone, Propofol and Midazolam  Airway Management Planned: Oral ETT  Additional Equipment:   Intra-op Plan:   Post-operative Plan:   Informed Consent: I have reviewed the patients History and Physical, chart, labs and discussed the procedure including the risks, benefits and alternatives for the proposed anesthesia with the patient or authorized representative who has indicated his/her understanding and acceptance.     Plan Discussed with:   Anesthesia Plan Comments:         Anesthesia Quick Evaluation

## 2016-12-05 NOTE — Telephone Encounter (Signed)
Spoke with Dr. Rosana Hoes. He would like to see patient in office today. Patient placed on schedule at 11am and asked to remain NPO prior to appointment. She verbalizes understanding.

## 2016-12-05 NOTE — Anesthesia Postprocedure Evaluation (Signed)
Anesthesia Post Note  Patient: Holly Werner  Procedure(s) Performed: Procedure(s) (LRB): APPENDECTOMY LAPAROSCOPIC (N/A)  Patient location during evaluation: PACU Anesthesia Type: General Level of consciousness: awake and alert Pain management: pain level controlled Vital Signs Assessment: post-procedure vital signs reviewed and stable Respiratory status: spontaneous breathing and respiratory function stable Cardiovascular status: stable Anesthetic complications: no     Last Vitals:  Vitals:   12/05/16 1951 12/05/16 2006  BP: (!) 114/55 (!) 105/44  Pulse: 71 70  Resp: 13 15  Temp:  36.9 C    Last Pain:  Vitals:   12/05/16 2006  TempSrc:   PainSc: 3                  KEPHART,WILLIAM K

## 2016-12-06 ENCOUNTER — Encounter: Payer: Self-pay | Admitting: Surgery

## 2016-12-06 DIAGNOSIS — K353 Acute appendicitis with localized peritonitis: Secondary | ICD-10-CM | POA: Diagnosis not present

## 2016-12-06 MED ORDER — TRAMADOL HCL 50 MG PO TABS: 50.0000 mg | ORAL_TABLET | Freq: Four times a day (QID) | ORAL | 0 refills | Status: DC | PRN

## 2016-12-06 NOTE — Care Management Obs Status (Signed)
[facility] NOTIFICATION   Patient Details  Name: Holly Werner MRN: [id number] Date of Birth: 1950/11/20   Medicare Observation Status Notification Given:  Yes Provided and explained notice.  Unable to take a signed copy out of the room as patient is on contact isolation.  she verbalized understanding of notice.   Katrina Stack, RN 12/06/2016, 8:44 AM

## 2016-12-06 NOTE — Progress Notes (Signed)
Pt to be discharged per MD order. IV removed. Instructions reviewed with pt and pt verbalizes understanding of all discharge material. Scripts given to pt. Will medicate with ordered pain meds prior to discharge per pt preference. Will  discharge in wheelchair.

## 2016-12-07 ENCOUNTER — Telehealth: Payer: Self-pay

## 2016-12-07 LAB — SURGICAL PATHOLOGY

## 2016-12-07 NOTE — Telephone Encounter (Signed)
Post-op call made to patient at this time. Spoke with Holly Werner. Post-op interview questions below.  1. How are you feeling? Having some soreness at the moment   2. Is your pain controlled? Yes  3. What are you doing for the pain? Taking Tramadol  4. Are you having any Nausea or Vomiting? None  5. Are you having any Fever or Chills? Some chills  6. Are you having any Constipation or Diarrhea? Had some diarrhea, having a great amount of gas  7. Is there any Swelling or Bruising you are concerned about? None  8. Do you have any questions or concerns at this time? Patient was concerns about having some diarrhea and having some gas. I told her to drink plenty of water and stay hydrated.   Discussion: Reminded patient of post op appointment on 7/25 at 3:45 with Dr. Dahlia Byes. Patient verbalized understanding.

## 2016-12-09 NOTE — Discharge Summary (Signed)
Physician Discharge Summary  Patient ID: Holly Werner MRN: [id number] DOB/AGE: June 21, 1950 66 y.o.  Admit date: 12/05/2016 Discharge date: 12/09/2016  Admission Diagnoses:  Discharge Diagnoses:  Active Problems:   Appendicitis   Acute appendicitis with localized peritonitis   Discharged Condition: good  Hospital Course: Patient is a 66 year old Female with rather significant COPD who recently presented to [facility] with abdominal pain and was diagnosed with acute non-perforated appendicitis. Due to her COPD, both operative and non-operative management of appendicitis were at that time discussed with the patient, and patient expressed her wish to be treated with antibiotics alone. Patient's activity tolerance, however, subsequently appeared much more than what patient and her family described. When patient returned to the office complaining of persistent nausea and diarrhea while on antibiotics, surgical intervention was offered, and patient was re-admitted for laparoscopic appendectomy, which she underwent uneventfully and tolerated well. Post-operatively, patient's recovery was unremarkable with only mild well-controlled peri-incisional pain and good tolerance of her diet being advanced and increasing ambulation. Accordingly, discharge planning was initiated, and patient was safely able to be discharged home with appropriate outpatient surgical follow-up and pain management.  Consults: None  Significant Diagnostic Studies: labs: WBC - 4.8, C Diff - nonreactive  Treatments: surgery: laparoscopic appendectomy  Discharge Exam: Blood pressure (!) 111/57, pulse (!) 58, temperature 97.7 F (36.5 C), temperature source Oral, resp. rate (!) 24, height 5' 3.5" (1.613 m), weight 128 lb 12.8 oz (58.4 kg), SpO2 97 %. General appearance: alert, cooperative and no distress GI: soft, mild peri-incisional tenderness to palpation with incisions well-approximated and no surrounding erythema or drainage; bowel  sounds normal; no masses,  no organomegaly  Disposition: 01-Home or Self Care   Allergies as of 12/06/2016      Reactions   Escitalopram Oxalate Palpitations   Loose stools   Aspirin Other (See Comments)   Stomach hurt   Codeine Other (See Comments)   stomach hurt   Levofloxacin    Other reaction(s): Dizziness      Medication List    TAKE these medications   albuterol 108 (90 Base) MCG/ACT inhaler Commonly known as:  PROVENTIL HFA;VENTOLIN HFA Inhale 2 puffs into the lungs every 6 (six) hours as needed for wheezing or shortness of breath.   amoxicillin-clavulanate 875-125 MG tablet Commonly known as:  AUGMENTIN Take 1 tablet by mouth 2 (two) times daily.   diazepam 5 MG tablet Commonly known as:  VALIUM Take 2.5 mg by mouth 2 (two) times daily as needed for anxiety.   traMADol 50 MG tablet Commonly known as:  ULTRAM Take 1 tablet (50 mg total) by mouth every 6 (six) hours as needed for severe pain.      Follow-up [place]. Go on 12/19/2016.   Specialty:  General Surgery Why:  Dr. Dahlia Byes, Wednesday, July 25 at 3:30 p.m.  [phone] Contact information: [place] [phone]          Signed: Vickie Epley 12/09/2016, 1:17 PM

## 2016-12-09 NOTE — Discharge Summary (Signed)
Physician Discharge Summary  Patient ID: Holly Werner MRN: [id number] DOB/AGE: 10/06/1950 66 y.o.  Admit date: 12/03/2016 Discharge date: 12/09/2016  Admission Diagnoses:  Discharge Diagnoses:  Active Problems:   Acute appendicitis   Discharged Condition: good  Hospital Course: Patient is a 66 year old Female with rather significant COPD who recently presented to [facility] with abdominal pain and was diagnosed with acute non-perforated appendicitis. Due to her COPD, both operative and non-operative management of appendicitis were at that time discussed with the patient, and patient expressed her wish to be treated with antibiotics alone. Patient's activity tolerance, however, subsequently appeared much more than what patient and her family described. Upon resolution of patient's abdominal pain, her diet was advanced and well-tolerated, and discharge planning was accordingly initiated, after which patient was safely able to be discharged home with a prescribed course of oral antibiotics and outpatient surgical follow-up.  Consults: None  Significant Diagnostic Studies: radiology: CT scan: acute appendicitis and severe COPD  Treatments: IV hydration and antibiotics: Zosyn  Discharge Exam: Blood pressure (!) 94/52, pulse 60, temperature (!) 97.5 F (36.4 C), temperature source Oral, resp. rate 16, height 5\' 3"  (1.6 m), weight 127 lb (57.6 kg), SpO2 100 %. General appearance: alert, cooperative and no distress GI: soft, non-tender; bowel sounds normal; no masses,  no organomegaly  Disposition: 01-Home or Self Care   Allergies as of 12/04/2016      Reactions   Aspirin Other (See Comments)   Stomach hurt   Codeine Other (See Comments)   stomach hurt   Levofloxacin    Other reaction(s): Dizziness      Medication List    TAKE these medications   amoxicillin-clavulanate 875-125 MG tablet Commonly known as:  AUGMENTIN Take 1 tablet by mouth 2 (two) times daily.   diazepam 5 MG  tablet Commonly known as:  VALIUM Take 2.5 mg by mouth 2 (two) times daily as needed for anxiety.      Follow-up Information    Vickie Epley, MD. Schedule an appointment as soon as possible for a visit in 1 week(s).   Specialty:  General Surgery Contact information: [place] [address] [phone]           Signed: Vickie Epley 12/09/2016, 5:32 PM

## 2016-12-10 ENCOUNTER — Ambulatory Visit
Admission: RE | Admit: 2016-12-10 | Discharge: 2016-12-10 | Disposition: A | Discharge status: Home / Self Care | Payer: Medicare Other | Source: Ambulatory Visit | Attending: Surgery | Admitting: Surgery

## 2016-12-10 ENCOUNTER — Telehealth: Payer: Self-pay | Admitting: General Practice

## 2016-12-10 DIAGNOSIS — R600 Localized edema: Secondary | ICD-10-CM | POA: Diagnosis present

## 2016-12-10 NOTE — Telephone Encounter (Signed)
Denies leg pain. Only in Right foot. Diarrhea beginnning Friday- states that it is not watery but there is no consistancy to her bowel movements. She states that she has not continued to take Augmentin post discharge.  Spoke with Dr. Hampton Abbot regarding patient. He would like patient to begin taking her Augmentin until it is complete. No orders regarding diarrhea. However, he would like to get a Venous Ultrasound to Rule Out DVT. Patient was informed of this and orders placed.  Venous Doppler scheduled for today at [facility] at 1515. Patient to arrive at 1500. Spoke with patient. She has been given all information and I informed her I would call with results of testing today.

## 2016-12-10 NOTE — Telephone Encounter (Signed)
Reviewed results of Venous Ultrasound with Dr. Hampton Abbot. Call made to patient, results has been reviewed with her and patient is relieved. Patient has asked to keep her feet up when sitting and only place down if walking until swelling goes down. In regards to patient's diarrhea, she will call if this gets worse or does not stop after finishing her Augmentin. Patient verbalizes understanding.

## 2016-12-10 NOTE — Telephone Encounter (Signed)
Patient is calling she was here on 12/05/16 for an appendectomy laparosopic with Dr. Rosana Hoes, she is complaining of swelling in her right foot, and also is having diarrhea with a very dark tint, patient denies vomiting, but sometimes has nausea but then goes right away.please call patient advice.

## 2016-12-12 ENCOUNTER — Telehealth: Payer: Self-pay

## 2016-12-12 ENCOUNTER — Ambulatory Visit: Payer: Self-pay | Admitting: Surgery

## 2016-12-12 NOTE — Telephone Encounter (Signed)
Returned call to patient at this time. I had spoke with patient in regards to Augmentin. She was informed to stop this medication after speaking with Dr. Rosana Hoes. The diarrhea is not as bad as it was in previous days and the swelling in her foot has receded. She was encouraged to call back with any other questions or concerns prior to her next scheduled appointment. She verbalizes understanding.

## 2016-12-12 NOTE — Telephone Encounter (Signed)
Patient is calling because Dr. Rosana Hoes prescribed her Augmentin. She took one yesterday evening and will again in an hour but she states it is making her feel sick. She has been throwing up for the past 2 hours. She would like to know if there is something else that can be prescribed for her. She states that her diarrhea is getting better. Please call patient and advice.

## 2016-12-19 ENCOUNTER — Ambulatory Visit (INDEPENDENT_AMBULATORY_CARE_PROVIDER_SITE_OTHER): Payer: Medicare Other | Admitting: Surgery

## 2016-12-19 ENCOUNTER — Encounter: Payer: Self-pay | Admitting: Surgery

## 2016-12-19 VITALS — BP 105/63 | HR 84 | Temp 97.9°F | Wt 126.0 lb

## 2016-12-19 DIAGNOSIS — Z09 Encounter for follow-up examination after completed treatment for conditions other than malignant neoplasm: Secondary | ICD-10-CM

## 2016-12-19 NOTE — Patient Instructions (Signed)
Please do not submerge in a tub, hot tub, or pool until incisions are completely sealed.  Use sun block to incision area over the next year if this area will be exposed to sun. This helps decrease scarring.  You may resume your normal activities. At this time- Listen to your body when lifting, if you have pain when lifting, stop and then try again in a few days. Soreness after doing exercises or activities of daily living is normal as you get back in to your normal routine.  If you develop redness, drainage, or pain at incision sites- call our office immediately and speak with a nurse.  Please call our office with any questions or concerns that you may have.  You are able to take Miralax 17 G twice a day. But if you want you could try Milk of Magnesium for your constipation.  Please take Fiber to help you with constipation.

## 2016-12-20 NOTE — Progress Notes (Signed)
S/p lap appy by Dr. Rosana Hoes 7/11 Doing well some mild pain and decrease appetite Path d./w pt  PE NAD Abd :soft, incisions c/d/i, no infection or peritonitis  A/P Doing well F/U prn No heavy lifting

## 2017-03-20 ENCOUNTER — Telehealth: Payer: Self-pay | Admitting: *Deleted

## 2017-03-20 NOTE — Telephone Encounter (Signed)
Left message for patient to notify them that it is time to schedule low dose lung cancer screening CT follow up scan. Instructed patient to call back to verify information prior to the scan being scheduled.  

## 2017-03-21 ENCOUNTER — Other Ambulatory Visit: Payer: Self-pay | Admitting: Oncology

## 2017-03-21 ENCOUNTER — Other Ambulatory Visit: Payer: Self-pay | Admitting: *Deleted

## 2017-03-21 ENCOUNTER — Telehealth: Payer: Self-pay | Admitting: *Deleted

## 2017-03-21 DIAGNOSIS — J439 Emphysema, unspecified: Secondary | ICD-10-CM

## 2017-03-21 DIAGNOSIS — R918 Other nonspecific abnormal finding of lung field: Secondary | ICD-10-CM

## 2017-03-21 DIAGNOSIS — D381 Neoplasm of uncertain behavior of trachea, bronchus and lung: Secondary | ICD-10-CM

## 2017-03-21 DIAGNOSIS — R911 Solitary pulmonary nodule: Secondary | ICD-10-CM

## 2017-03-21 NOTE — Progress Notes (Unsigned)
im 

## 2017-03-21 NOTE — Telephone Encounter (Signed)
Notified patient that lung cancer screening low dose CT scan follow up imaging is due currently or will be in near future. Confirmed that patient is within the age range of 55-77, and asymptomatic, (no signs or symptoms of lung cancer). Patient denies illness that would prevent curative treatment for lung cancer if found. Verified smoking history, (current, 38.5 pack year). The shared decision making visit was done 10/09/16. Patient is agreeable for CT scan being scheduled.

## 2017-03-21 NOTE — Telephone Encounter (Signed)
Left message for patient to notify them that it is time to schedule low dose lung cancer screening CT scan followup. Instructed patient to call back to verify information prior to the scan being scheduled.  

## 2017-03-22 ENCOUNTER — Other Ambulatory Visit: Payer: Self-pay | Admitting: *Deleted

## 2017-03-22 DIAGNOSIS — R918 Other nonspecific abnormal finding of lung field: Secondary | ICD-10-CM

## 2017-03-22 DIAGNOSIS — Z87891 Personal history of nicotine dependence: Secondary | ICD-10-CM

## 2017-03-28 ENCOUNTER — Ambulatory Visit
Admission: RE | Admit: 2017-03-28 | Discharge: 2017-03-28 | Disposition: A | Discharge status: Home / Self Care | Payer: Medicare Other | Source: Ambulatory Visit | Attending: Nurse Practitioner | Admitting: Nurse Practitioner

## 2017-03-28 DIAGNOSIS — I700 Atherosclerosis of aorta: Secondary | ICD-10-CM | POA: Diagnosis not present

## 2017-03-28 DIAGNOSIS — I2510 Atherosclerotic heart disease of native coronary artery without angina pectoris: Secondary | ICD-10-CM | POA: Insufficient documentation

## 2017-03-28 DIAGNOSIS — J984 Other disorders of lung: Secondary | ICD-10-CM | POA: Insufficient documentation

## 2017-03-28 DIAGNOSIS — Z87891 Personal history of nicotine dependence: Secondary | ICD-10-CM | POA: Diagnosis not present

## 2017-03-28 DIAGNOSIS — R918 Other nonspecific abnormal finding of lung field: Secondary | ICD-10-CM | POA: Insufficient documentation

## 2017-03-28 DIAGNOSIS — R911 Solitary pulmonary nodule: Secondary | ICD-10-CM | POA: Insufficient documentation

## 2017-03-28 DIAGNOSIS — J439 Emphysema, unspecified: Secondary | ICD-10-CM | POA: Insufficient documentation

## 2017-04-01 ENCOUNTER — Encounter: Payer: Self-pay | Admitting: *Deleted

## 2018-03-20 ENCOUNTER — Telehealth: Payer: Self-pay | Admitting: *Deleted

## 2018-03-20 NOTE — Telephone Encounter (Signed)
Attempted to contact patient r/t LDCT Screening follow up due at this time.  No answer received, message left for patient to call [phone] to schedule appointment.    

## 2018-03-25 ENCOUNTER — Telehealth: Payer: Self-pay | Admitting: Nurse Practitioner

## 2018-04-09 ENCOUNTER — Encounter: Payer: Self-pay | Admitting: *Deleted

## 2018-04-14 ENCOUNTER — Telehealth: Payer: Self-pay | Admitting: *Deleted

## 2018-04-14 DIAGNOSIS — Z87891 Personal history of nicotine dependence: Secondary | ICD-10-CM

## 2018-04-14 DIAGNOSIS — Z122 Encounter for screening for malignant neoplasm of respiratory organs: Secondary | ICD-10-CM

## 2018-04-14 NOTE — Telephone Encounter (Signed)
Patient has been notified that annual lung cancer screening low dose CT scan is due currently or will be in near future. Confirmed that patient is within the age range of 55-77, and asymptomatic, (no signs or symptoms of lung cancer). Patient denies illness that would prevent curative treatment for lung cancer if found. Verified smoking history, (current, 39 pack year). The shared decision making visit was done 10/09/16. Patient is agreeable for CT scan being scheduled.

## 2018-04-17 ENCOUNTER — Ambulatory Visit
Admission: RE | Admit: 2018-04-17 | Discharge: 2018-04-17 | Disposition: A | Discharge status: Home / Self Care | Payer: Medicare Other | Source: Ambulatory Visit | Attending: Oncology | Admitting: Oncology

## 2018-04-17 DIAGNOSIS — J432 Centrilobular emphysema: Secondary | ICD-10-CM | POA: Insufficient documentation

## 2018-04-17 DIAGNOSIS — I700 Atherosclerosis of aorta: Secondary | ICD-10-CM | POA: Insufficient documentation

## 2018-04-17 DIAGNOSIS — J438 Other emphysema: Secondary | ICD-10-CM | POA: Insufficient documentation

## 2018-04-17 DIAGNOSIS — F17210 Nicotine dependence, cigarettes, uncomplicated: Secondary | ICD-10-CM | POA: Diagnosis not present

## 2018-04-17 DIAGNOSIS — Z122 Encounter for screening for malignant neoplasm of respiratory organs: Secondary | ICD-10-CM | POA: Insufficient documentation

## 2018-04-17 DIAGNOSIS — I2510 Atherosclerotic heart disease of native coronary artery without angina pectoris: Secondary | ICD-10-CM | POA: Insufficient documentation

## 2018-04-17 DIAGNOSIS — Z87891 Personal history of nicotine dependence: Secondary | ICD-10-CM

## 2018-04-18 ENCOUNTER — Telehealth: Payer: Self-pay | Admitting: *Deleted

## 2018-04-18 NOTE — Telephone Encounter (Signed)
Regarding lung screening scan results. After discussion with PCP, Medical Oncology, and Thoracic Nurse Navigator, contacted patient and reviewed recommended plan to include PET scan, discussion at multidisciplinary conference, and then decision on next steps. Patient is agreeable to this plan.   IMPRESSION: 1. Lung-RADS 4BS, suspicious. Additional imaging evaluation or consultation with Pulmonology or Thoracic Surgery recommended. 2. The "S" modifier above refers to potentially clinically significant non lung cancer related findings. Specifically, there is aortic atherosclerosis, in addition to right coronary artery disease. Please note that although the presence of coronary artery calcium documents the presence of coronary artery disease, the severity of this disease and any potential stenosis cannot be assessed on this non-gated CT examination. Assessment for potential risk factor modification, dietary therapy or pharmacologic therapy may be warranted, if clinically indicated. 3. Mild diffuse bronchial wall thickening with severe centrilobular and paraseptal emphysema; imaging findings suggestive of underlying COPD.

## 2018-04-21 ENCOUNTER — Encounter: Payer: Self-pay | Admitting: *Deleted

## 2018-04-21 DIAGNOSIS — R911 Solitary pulmonary nodule: Secondary | ICD-10-CM

## 2018-04-21 NOTE — Progress Notes (Signed)
  Oncology Nurse Navigator Documentation  Navigator Location: [facility] (04/21/18 1400) Referral date to RadOnc/MedOnc: 04/18/18 (04/21/18 1400) )Navigator Encounter Type: Introductory phone call (04/21/18 1400)   Abnormal Finding Date: 04/17/18 (04/21/18 1400)                   Treatment Phase: Abnormal Scans (04/21/18 1400) Barriers/Navigation Needs: Coordination of Care (04/21/18 1400)   Interventions: Coordination of Care (04/21/18 1400)   Coordination of Care: Appts;Radiology (04/21/18 1400)        Acuity: Level 2 (04/21/18 1400)   Acuity Level 2: Initial guidance, education and coordination as needed;Educational needs;Assistance expediting appointments (04/21/18 1400)    phone call made to patient to introduce to services. Reviewed upcoming appts with patient. Contact info given and instructed to call with any further questions or needs. Pt verbalized understanding. All questions answered during phone call. Time Spent with Patient: 30 (04/21/18 1400)

## 2018-04-29 ENCOUNTER — Ambulatory Visit: Payer: Medicare Other

## 2018-05-02 ENCOUNTER — Ambulatory Visit: Payer: Medicare Other | Admitting: Oncology

## 2018-05-05 ENCOUNTER — Ambulatory Visit
Admission: RE | Admit: 2018-05-05 | Discharge: 2018-05-05 | Disposition: A | Discharge status: Home / Self Care | Payer: Medicare Other | Source: Ambulatory Visit | Attending: Oncology | Admitting: Oncology

## 2018-05-05 DIAGNOSIS — J439 Emphysema, unspecified: Secondary | ICD-10-CM | POA: Diagnosis not present

## 2018-05-05 DIAGNOSIS — I700 Atherosclerosis of aorta: Secondary | ICD-10-CM | POA: Insufficient documentation

## 2018-05-05 DIAGNOSIS — R911 Solitary pulmonary nodule: Secondary | ICD-10-CM | POA: Insufficient documentation

## 2018-05-05 DIAGNOSIS — I2510 Atherosclerotic heart disease of native coronary artery without angina pectoris: Secondary | ICD-10-CM | POA: Insufficient documentation

## 2018-05-05 LAB — GLUCOSE, CAPILLARY: GLUCOSE-CAPILLARY: 91 mg/dL (ref 70–99)

## 2018-05-05 MED ORDER — FLUDEOXYGLUCOSE F - 18 (FDG) INJECTION
6.6700 | Freq: Once | INTRAVENOUS | Status: AC | PRN
  Administered 2018-05-05: 6.67 via INTRAVENOUS

## 2018-05-08 ENCOUNTER — Other Ambulatory Visit: Payer: Medicare Other

## 2018-05-08 NOTE — Progress Notes (Signed)
Tumor Board Documentation  Holly Werner was presented by Norvel Richards, RN at our Tumor Board on 05/08/2018, which included representatives from medical oncology, radiation oncology, surgical oncology, internal medicine, navigation, pathology, radiology, surgical, research, pulmonology.  Holly Werner currently presents as a new patient, for discussion with history of the following treatments: active survellience.  Additionally, we reviewed previous medical and familial history, history of present illness, and recent lab results along with all available histopathologic and imaging studies. The tumor board considered available treatment options and made the following recommendations: Active surveillance Re scan in 3 months  The following procedures/referrals were also placed: No orders of the defined types were placed in this encounter.   Clinical Trial Status: not discussed    Tumor board is a meeting of clinicians from various specialty areas who evaluate and discuss patients for whom a multidisciplinary approach is being considered. Final determinations in the plan of care are those of the provider(s). The responsibility for follow up of recommendations given during tumor board is that of the provider.   Today's extended care, comprehensive team conference, Holly Werner was not present for the discussion and was not examined.   Multidisciplinary Tumor Board is a multidisciplinary case peer review process.  Decisions discussed in the Multidisciplinary Tumor Board reflect the opinions of the specialists present at the conference without having examined the patient.  Ultimately, treatment and diagnostic decisions rest with the primary provider(s) and the patient.

## 2018-05-09 ENCOUNTER — Inpatient Hospital Stay: Payer: Medicare Other | Attending: Oncology | Admitting: Oncology

## 2018-05-09 ENCOUNTER — Encounter: Payer: Self-pay | Admitting: Oncology

## 2018-05-09 ENCOUNTER — Other Ambulatory Visit: Payer: Self-pay

## 2018-05-09 VITALS — BP 108/68 | HR 85 | Temp 97.6°F | Resp 18 | Ht 64.0 in | Wt 127.2 lb

## 2018-05-09 DIAGNOSIS — Z87891 Personal history of nicotine dependence: Secondary | ICD-10-CM

## 2018-05-09 DIAGNOSIS — R911 Solitary pulmonary nodule: Secondary | ICD-10-CM

## 2018-05-09 DIAGNOSIS — K219 Gastro-esophageal reflux disease without esophagitis: Secondary | ICD-10-CM | POA: Insufficient documentation

## 2018-05-09 DIAGNOSIS — J439 Emphysema, unspecified: Secondary | ICD-10-CM | POA: Insufficient documentation

## 2018-05-09 DIAGNOSIS — R918 Other nonspecific abnormal finding of lung field: Secondary | ICD-10-CM | POA: Diagnosis present

## 2018-05-09 DIAGNOSIS — F17210 Nicotine dependence, cigarettes, uncomplicated: Secondary | ICD-10-CM | POA: Insufficient documentation

## 2018-05-09 NOTE — Progress Notes (Signed)
Patient here for initial visit. Pt is an everyday smoker.

## 2018-05-10 NOTE — Progress Notes (Signed)
Hematology/Oncology Consult note [facility] [phone] [phone]   Patient Care Team: Marinda Elk, MD as PCP - General (Physician Assistant) Telford Nab, RN as Registered Nurse  REFERRING PROVIDER: Telford Nab CHIEF COMPLAINTS/REASON FOR VISIT:  Evaluation of lung nodule  HISTORY OF PRESENTING ILLNESS:  Holly Werner is a  67 y.o.  female with PMH listed below who was referred to me for evaluation of lung nodules.  Patient is current everyday smoker, 38.25-pack-year smoking history.  Past medical history includes thoracic outlet syndrome, GERD, emphysema, chronic pain and she underwent lung cancer screening CT scan recently on 04/17/2018 Which showed lung RADS 4 BS, suspicious.  Multiple small pulmonary nodules are noted throughout the lungs bilaterally.  Most concerning of these nodules is in the medial aspect of the right upper lobe with a volume derived mean diameter of 10 mm.  No acute consolidative airspace disease.  No pleural effusion.  Diffuse bronchial wall thickening with severe centrilobular and paraseptal emphysema. Patient was referred to cancer center for further evaluation.  I have reviewed patient's chart and images and obtained PET scan.  PET scan was done on 05/05/2018 showed no evidence of hypermetabolic him within the lungs, including at the site of pulmonary nodules.  The majority of the nodules are on the low end of PET resolution.  Recommend screening CT follow-up at 3 months interval.  Pulmonary artery enlargement suggest pulmonary artery hypertension. Patient's case was discussed on multidisciplinary tumor conference on 05/08/2018.  Consensus reached for follow-up CT in 3 months. Patient present to discuss image results and further management plan.Patient reports having occasional cough, shortness of breath with exertion.  Reports feeling much better than a few weeks ago when she had a flare. Denies any weight  loss, hemoptysis, chest pain, fever or chills Review of Systems  Constitutional: Negative for appetite change, chills, fatigue and fever.  HENT:   Negative for hearing loss and voice change.   Eyes: Negative for eye problems.  Respiratory: Negative for chest tightness and cough.   Cardiovascular: Negative for chest pain.  Gastrointestinal: Negative for abdominal distention, abdominal pain and blood in stool.  Endocrine: Negative for hot flashes.  Genitourinary: Negative for difficulty urinating and frequency.   Musculoskeletal: Negative for arthralgias.  Skin: Negative for itching and rash.  Neurological: Negative for extremity weakness.  Hematological: Negative for adenopathy.  Psychiatric/Behavioral: Negative for confusion.    MEDICAL HISTORY:  Past Medical History:  Diagnosis Date  . Chronic pain   . Emphysema lung ([place])   . GERD (gastroesophageal reflux disease)   . Thoracic outlet syndrome     SURGICAL HISTORY: Past Surgical History:  Procedure Laterality Date  . ABDOMINAL HYSTERECTOMY     partial  . LAPAROSCOPIC APPENDECTOMY N/A 12/05/2016   Procedure: APPENDECTOMY LAPAROSCOPIC;  Surgeon: Vickie Epley, MD;  Location: [facility] ORS;  Service: General;  Laterality: N/A;  . THORACIC OUTLET SURGERY      SOCIAL HISTORY: Social History   Socioeconomic History  . Marital status: Married    Spouse name: Not on file  . Number of children: Not on file  . Years of education: Not on file  . Highest education level: Not on file  Occupational History  . Occupation: retired  Scientific laboratory technician  . Financial resource strain: Not on file  . Food insecurity:    Worry: Not on file    Inability: Not on file  . Transportation needs:    Medical: Not on file  Non-medical: Not on file  Tobacco Use  . Smoking status: Current Every Day Smoker    Packs/day: 0.75    Years: 51.00    Pack years: 38.25    Types: Cigarettes  . Smokeless tobacco: Never Used  . Tobacco comment: 10  cigarettes perday  Substance and Sexual Activity  . Alcohol use: No    Alcohol/week: 0.0 standard drinks  . Drug use: No  . Sexual activity: Not on file  Lifestyle  . Physical activity:    Days per week: Not on file    Minutes per session: Not on file  . Stress: Not on file  Relationships  . Social connections:    Talks on phone: Not on file    Gets together: Not on file    Attends religious service: Not on file    Active member of club or organization: Not on file    Attends meetings of clubs or organizations: Not on file    Relationship status: Not on file  . Intimate partner violence:    Fear of current or ex partner: Not on file    Emotionally abused: Not on file    Physically abused: Not on file    Forced sexual activity: Not on file  Other Topics Concern  . Not on file  Social History Narrative  . Not on file    FAMILY HISTORY: Family History  Problem Relation Age of Onset  . Vision loss Mother   . Macular degeneration Mother   . Dementia Mother   . Kidney disease Father   . COPD Sister   . Uterine cancer Sister   . Lung cancer Maternal Aunt   . COPD Sister   . Colon cancer Maternal Aunt   . Ovarian cancer Maternal Aunt     ALLERGIES:  is allergic to escitalopram oxalate; aspirin; codeine; and levofloxacin.  MEDICATIONS:  Current Outpatient Medications  Medication Sig Dispense Refill  . albuterol (PROVENTIL HFA;VENTOLIN HFA) 108 (90 Base) MCG/ACT inhaler Inhale 2 puffs into the lungs every 6 (six) hours as needed for wheezing or shortness of breath.    . chlorpheniramine-HYDROcodone (TUSSIONEX) 10-8 MG/5ML SUER SHAKE LQ AND TK 5 ML PO Q 12 H PRF COUGH  0  . diazepam (VALIUM) 5 MG tablet Take 2.5 mg by mouth 2 (two) times daily as needed for anxiety.    . traMADol (ULTRAM) 50 MG tablet Take 1 tablet (50 mg total) by mouth every 6 (six) hours as needed for severe pain. (Patient not taking: Reported on 05/09/2018) 30 tablet 0   No current  facility-administered medications for this visit.      PHYSICAL EXAMINATION: ECOG PERFORMANCE STATUS: 0 - Asymptomatic Vitals:   05/09/18 1052  BP: 108/68  Pulse: 85  Resp: 18  Temp: 97.6 F (36.4 C)  SpO2: 94%   Filed Weights   05/09/18 1052  Weight: 127 lb 3.2 oz (57.7 kg)    Physical Exam Constitutional:      General: She is not in acute distress.    Comments: Thin   HENT:     Head: Normocephalic and atraumatic.  Eyes:     General: No scleral icterus.    Pupils: Pupils are equal, round, and reactive to light.  Neck:     Musculoskeletal: Normal range of motion and neck supple.  Cardiovascular:     Rate and Rhythm: Normal rate and regular rhythm.     Heart sounds: Normal heart sounds.  Pulmonary:     Effort:  Pulmonary effort is normal. No respiratory distress.     Breath sounds: No wheezing.  Abdominal:     General: Bowel sounds are normal. There is no distension.     Palpations: Abdomen is soft. There is no mass.     Tenderness: There is no abdominal tenderness.  Musculoskeletal: Normal range of motion.        General: No deformity.  Skin:    General: Skin is warm and dry.     Findings: No erythema or rash.  Neurological:     Mental Status: She is alert and oriented to person, place, and time.     Cranial Nerves: No cranial nerve deficit.     Coordination: Coordination normal.  Psychiatric:        Behavior: Behavior normal.        Thought Content: Thought content normal.      LABORATORY DATA:  I have reviewed the data as listed Lab Results  Component Value Date   WBC 4.8 12/04/2016   HGB 13.5 12/04/2016   HCT 39.5 12/04/2016   MCV 89.1 12/04/2016   PLT 166 12/04/2016   No results for input(s): NA, K, CL, CO2, GLUCOSE, BUN, CREATININE, CALCIUM, GFRNONAA, GFRAA, PROT, ALBUMIN, AST, ALT, ALKPHOS, BILITOT, BILIDIR, IBILI in the last 8760 hours. Iron/TIBC/Ferritin/ %Sat No results found for: IRON, TIBC, FERRITIN, IRONPCTSAT      ASSESSMENT &  PLAN:  1. Pulmonary nodule   2. Personal history of tobacco use, presenting hazards to health   3. Abnormal CT lung screening    CT chest and PET scan images were independently reviewed by me and discussed with patient. Lung nodules, without significant hypermetabolic activities on PET scan. Discussed with patient that these nodules can be secondary to chronic inflammation, scarring, and indolent cancerous process cannot be ruled out.  Recommend to repeat a CT scan in 3 months.  Patient can continue follow-up with lung cancer screening program. Patient appreciate discussion and explanation. # Smoking cessation was discussed with patient and cessation program information was given to patient.  All questions were answered. The patient knows to call the clinic with any problems questions or concerns.  Return of visit: Patient will continue follow-up with lung cancer screening clinic program.  If any additional concerns or new findings on images, she can return to cancer center for further evaluation. Thank you for this kind referral and the opportunity to participate in the care of this patient. A copy of today's note is routed to referring provider  Total face to face encounter time for this patient visit was 45 min. >50% of the time was  spent in counseling and coordination of care.    Earlie Server, MD, PhD Hematology Oncology [facility] Pager- [phone] 05/10/2018

## 2018-06-13 ENCOUNTER — Telehealth: Payer: Self-pay | Admitting: *Deleted

## 2018-06-13 NOTE — Telephone Encounter (Signed)
-----   Message from Earlie Server, MD sent at 05/13/2018 11:40 AM EST ----- Regarding: FW: pt meets criteria for genetics [name], can you ask if she is interested in having genetic testing due to her family history.  If interested, she can have non urgent appt to talk to genetics.  Thanks.  ----- Message ----- From: Tana Felts Sent: 05/12/2018   3:35 PM EST To: Lieutenant Diego, RN, Earlie Server, MD Subject: pt meets criteria for genetics                 Hello,  Sorry for the delay in reviewing Thurs tumor board- but this patient meets criteria for genetics based on family history of colon, ovarian, uterine cancer if she is willing/agreeable to genetics consult.   Thanks, Ria Comment

## 2018-06-13 NOTE — Telephone Encounter (Signed)
Message left with patient regarding referral to genetics counselor if interested due to family history. Instructed pt to call back to discuss further and schedule appt if desired.

## 2018-07-26 ENCOUNTER — Telehealth: Payer: Self-pay

## 2018-07-26 NOTE — Telephone Encounter (Signed)
Call pt regarding lung screening. Left message to return call. 

## 2018-07-30 ENCOUNTER — Encounter: Payer: Self-pay | Admitting: *Deleted

## 2018-07-30 ENCOUNTER — Telehealth: Payer: Self-pay | Admitting: *Deleted

## 2018-07-30 NOTE — Telephone Encounter (Signed)
Left message for patient to notify them that it is time to schedule annual low dose lung cancer screening CT scan. Instructed patient to call back to verify information prior to the scan being scheduled.  

## 2018-08-09 ENCOUNTER — Telehealth: Payer: Self-pay

## 2018-08-09 NOTE — Telephone Encounter (Signed)
Call pt regarding lung screening. Left message for pt to return call.  

## 2018-08-15 ENCOUNTER — Telehealth: Payer: Self-pay | Admitting: *Deleted

## 2018-08-15 NOTE — Telephone Encounter (Signed)
Left detailed message in attempt to schedule follow up imaging for lung rads 4BS and PET scan follow up as recommended. Patient is urged to call back to schedule CT scan.

## 2018-08-18 ENCOUNTER — Telehealth: Payer: Self-pay | Admitting: *Deleted

## 2018-08-18 DIAGNOSIS — Z87891 Personal history of nicotine dependence: Secondary | ICD-10-CM

## 2018-08-18 DIAGNOSIS — R918 Other nonspecific abnormal finding of lung field: Secondary | ICD-10-CM

## 2018-08-18 DIAGNOSIS — Z122 Encounter for screening for malignant neoplasm of respiratory organs: Secondary | ICD-10-CM

## 2018-08-18 NOTE — Telephone Encounter (Signed)
Patient returned call and requests recommended LCS nodule follow up scan be scheduled.

## 2018-08-18 NOTE — Telephone Encounter (Signed)
Patient reports recent upper respiratory infection with recent antibiotics and steroid treatment and is still symptomatic. After discussion with Colletta Maryland at [facility], patient is rescheduled 1st week of April. Patient verbalizes understanding.

## 2018-08-20 ENCOUNTER — Ambulatory Visit: Admission: RE | Admit: 2018-08-20 | Payer: Medicare Other | Source: Ambulatory Visit

## 2018-08-20 ENCOUNTER — Encounter: Payer: Self-pay | Admitting: *Deleted

## 2018-09-02 ENCOUNTER — Other Ambulatory Visit: Payer: Self-pay

## 2018-09-02 ENCOUNTER — Ambulatory Visit
Admission: RE | Admit: 2018-09-02 | Discharge: 2018-09-02 | Disposition: A | Discharge status: Home / Self Care | Payer: Medicare Other | Source: Ambulatory Visit | Attending: Oncology | Admitting: Oncology

## 2018-09-02 DIAGNOSIS — Z122 Encounter for screening for malignant neoplasm of respiratory organs: Secondary | ICD-10-CM | POA: Insufficient documentation

## 2018-09-02 DIAGNOSIS — R918 Other nonspecific abnormal finding of lung field: Secondary | ICD-10-CM | POA: Diagnosis present

## 2018-09-02 DIAGNOSIS — Z87891 Personal history of nicotine dependence: Secondary | ICD-10-CM | POA: Insufficient documentation

## 2018-09-03 ENCOUNTER — Telehealth: Payer: Self-pay | Admitting: *Deleted

## 2018-09-03 ENCOUNTER — Encounter: Payer: Self-pay | Admitting: *Deleted

## 2018-09-03 NOTE — Telephone Encounter (Signed)
Notified patient of LDCT lung cancer screening program results with recommendation for 12 month follow up imaging. Also notified of incidental findings noted below and is encouraged to discuss further with PCP who will receive a copy of this note and/or the CT report. Patient verbalizes understanding.   IMPRESSION: 1. Lung-RADS 2, benign appearance or behavior. Continue annual screening with low-dose chest CT without contrast in 12 months. 2.  Emphysema. (ICD10-J43.9) 3.  Aortic Atherosclerois (ICD10-170.0) 4. Enlargement of the main pulmonary arteries suggests pulmonary arterial hypertension.

## 2018-09-04 ENCOUNTER — Encounter: Payer: Self-pay | Admitting: *Deleted

## 2019-08-21 ENCOUNTER — Telehealth: Payer: Self-pay | Admitting: *Deleted

## 2019-08-21 NOTE — Telephone Encounter (Signed)
(  08/21/19) Left message for pt to notify them that it is time to schedule annual low dose lung cancer screening CT scan. Instructed patient to call back to verify information prior to the scan being scheduled [facility]

## 2019-09-03 ENCOUNTER — Telehealth: Payer: Self-pay

## 2019-09-03 NOTE — Telephone Encounter (Signed)
Message left notifying patient that it is time to schedule the low dose lung cancer screening CT scan.  Instructed patient to return call to Shawn Perkins at [phone] to verify information prior to CT scan being scheduled.    

## 2019-09-10 ENCOUNTER — Telehealth: Payer: Self-pay

## 2019-09-10 NOTE — Telephone Encounter (Signed)
Message left notifying patient that it is time to schedule the low dose lung cancer screening CT scan.  Instructed patient to return call to Shawn Perkins at [phone] to verify information prior to CT scan being scheduled.    

## 2019-10-01 ENCOUNTER — Other Ambulatory Visit: Payer: Self-pay | Admitting: Physician Assistant

## 2019-10-01 DIAGNOSIS — R634 Abnormal weight loss: Secondary | ICD-10-CM

## 2019-10-01 DIAGNOSIS — R7303 Prediabetes: Secondary | ICD-10-CM

## 2019-10-01 DIAGNOSIS — J439 Emphysema, unspecified: Secondary | ICD-10-CM

## 2019-10-13 ENCOUNTER — Other Ambulatory Visit: Payer: Self-pay

## 2019-10-13 ENCOUNTER — Ambulatory Visit
Admission: RE | Admit: 2019-10-13 | Discharge: 2019-10-13 | Disposition: A | Discharge status: Home / Self Care | Payer: Medicare Other | Source: Ambulatory Visit | Attending: Physician Assistant | Admitting: Physician Assistant

## 2019-10-13 DIAGNOSIS — J439 Emphysema, unspecified: Secondary | ICD-10-CM | POA: Insufficient documentation

## 2019-10-13 DIAGNOSIS — R7303 Prediabetes: Secondary | ICD-10-CM | POA: Insufficient documentation

## 2019-10-13 DIAGNOSIS — R634 Abnormal weight loss: Secondary | ICD-10-CM | POA: Diagnosis present

## 2019-10-13 MED ORDER — IOHEXOL 300 MG/ML  SOLN
85.0000 mL | Freq: Once | INTRAMUSCULAR | Status: AC | PRN
  Administered 2019-10-13: 85 mL via INTRAVENOUS

## 2020-04-20 IMAGING — CT CT CHEST LUNG CANCER SCREENING LOW DOSE W/O CM
2 of 5 series · 14 of 40 positions shown, 17 images · non-contrast
Comparison: Low-dose in cancer screening chest CT 03/28/2017.

CLINICAL DATA: 67-year-old female current smoker with 39 pack year
history of smoking. Lung cancer screening examination.

EXAM:
CT CHEST WITHOUT CONTRAST LOW-DOSE FOR LUNG CANCER SCREENING
TECHNIQUE: Multidetector CT imaging of the chest was performed following the
standard protocol without IV contrast.

[Series 3: lung · axial · 0.54mm/px · z∈[-1234,-935]mm · 11 of 331 slices shown, 14 images (1 of 2)]
[im 16/331  mediastinal]
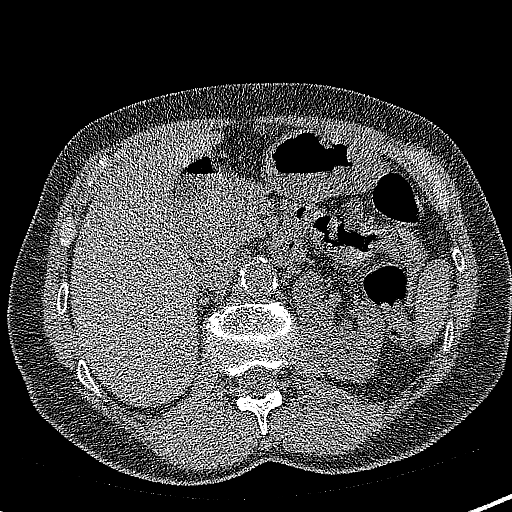
[im 16/331  lung]
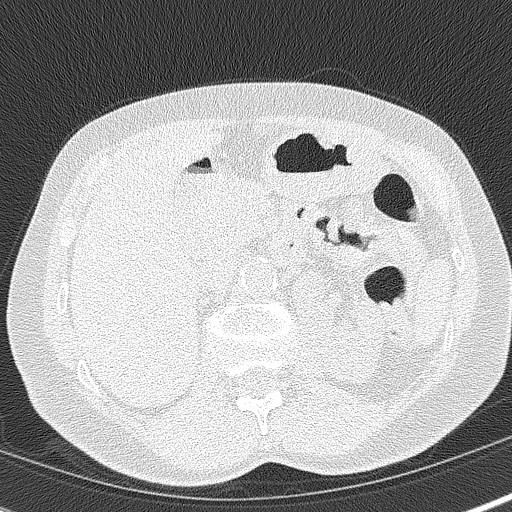
[im 48/331  lung]
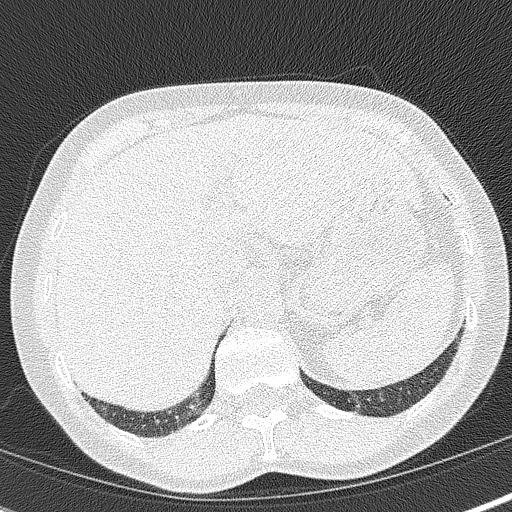
[im 79/331  lung]
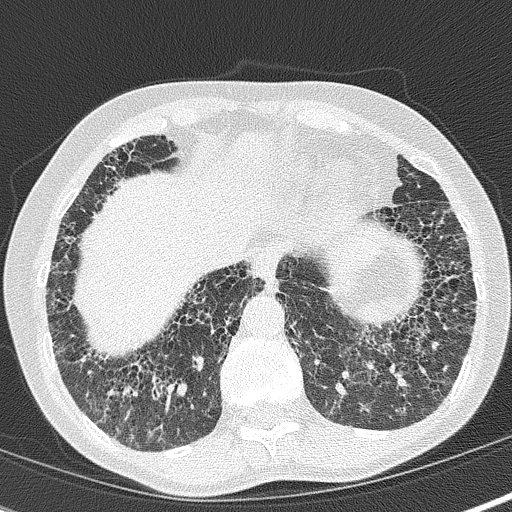
[im 111/331  lung]
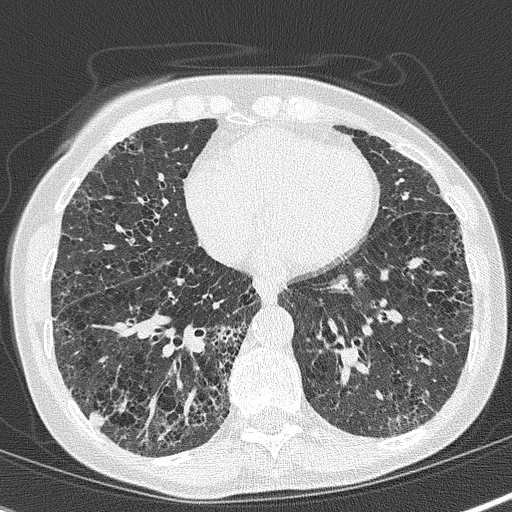
[im 142/331  mediastinal]
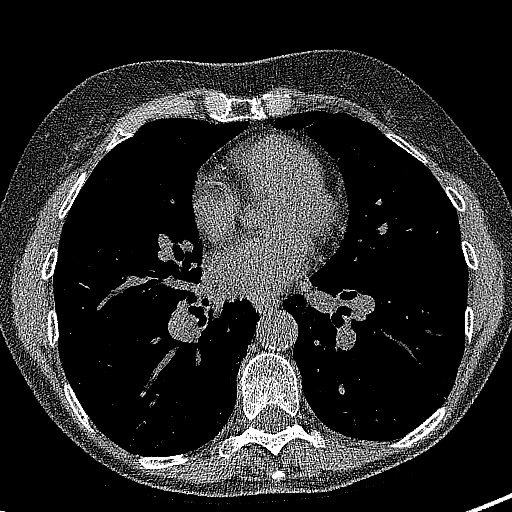
[im 142/331  lung]
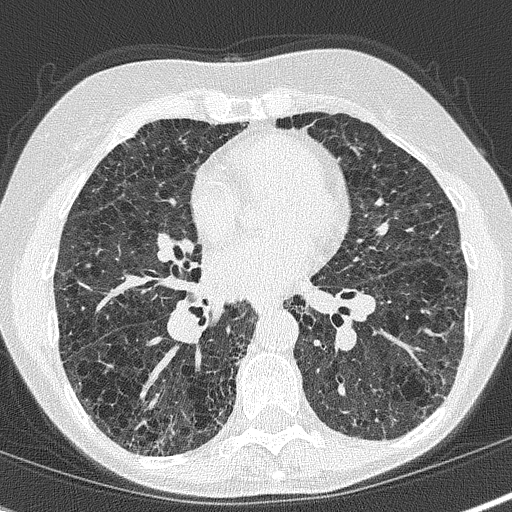
[im 173/331  lung]
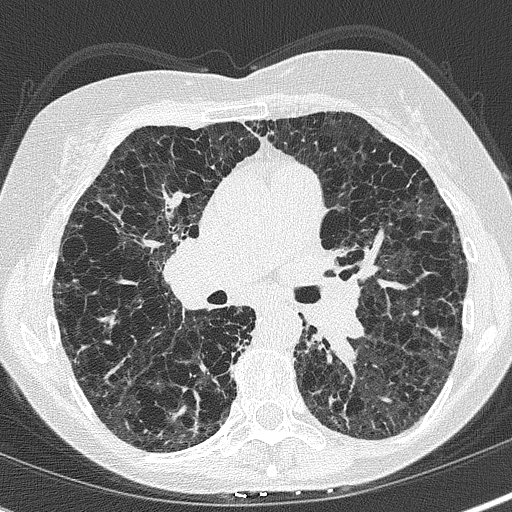
[im 189/331  lung]
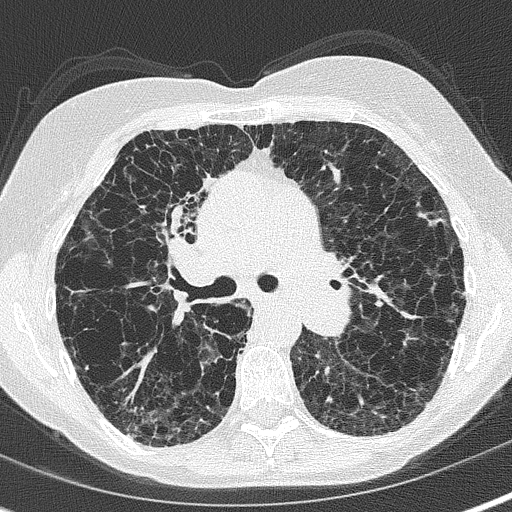
[im 221/331  lung]
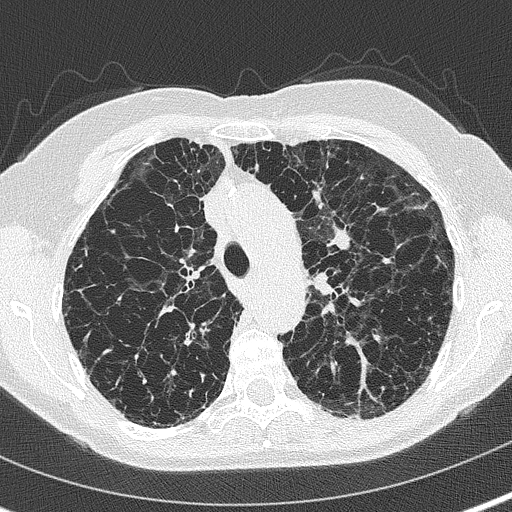
[im 252/331  mediastinal]
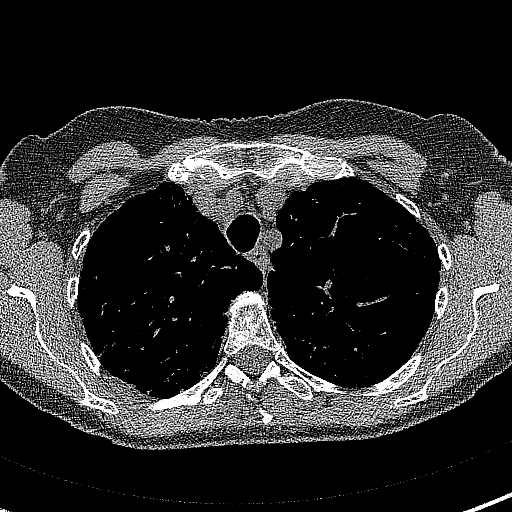
[im 252/331  lung]
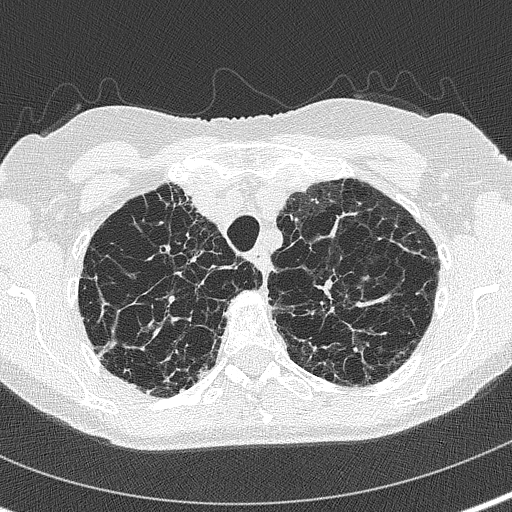
[im 283/331  lung]
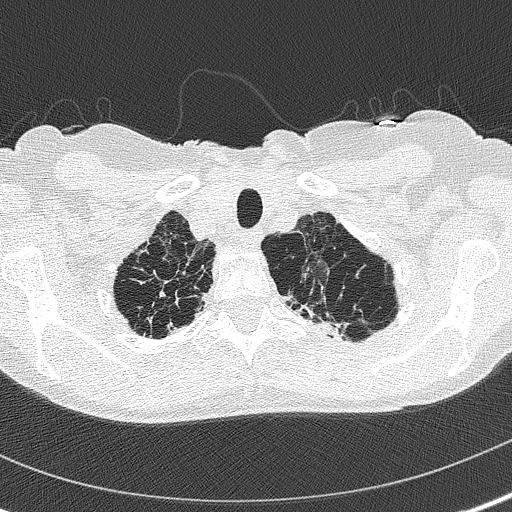
[im 315/331  lung]
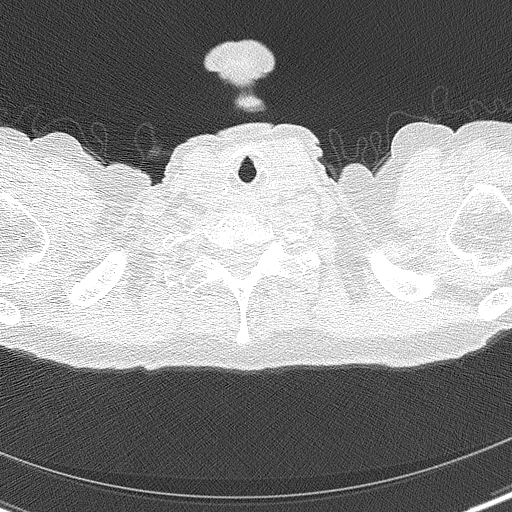

[Series 4: lung · coronal · 0.54mm/px · 3 of 275 slices shown (2 of 2)]
[im 55/275  lung]
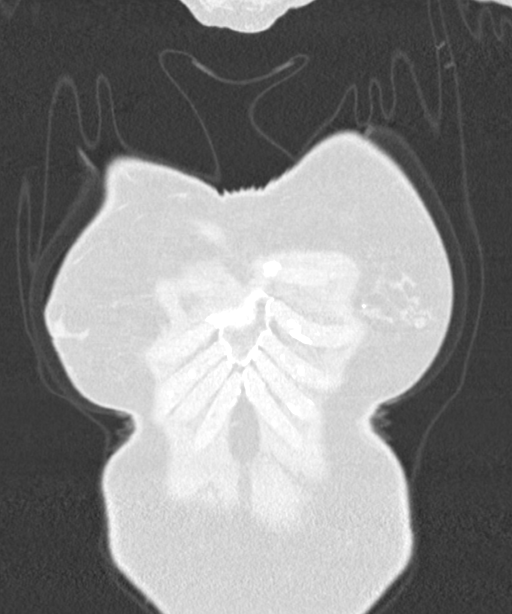
[im 110/275  lung]
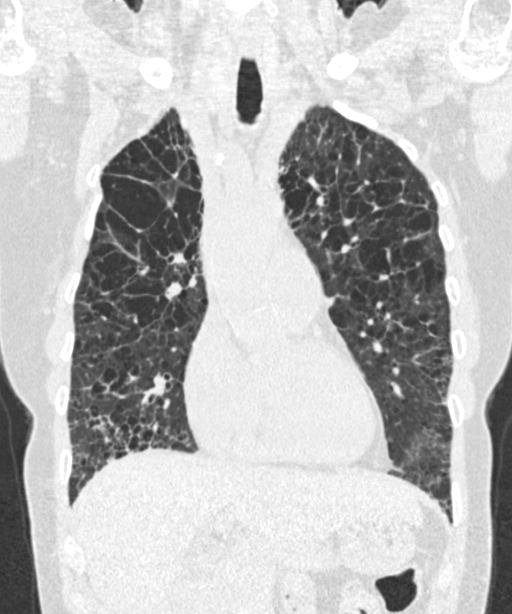
[im 165/275  lung]
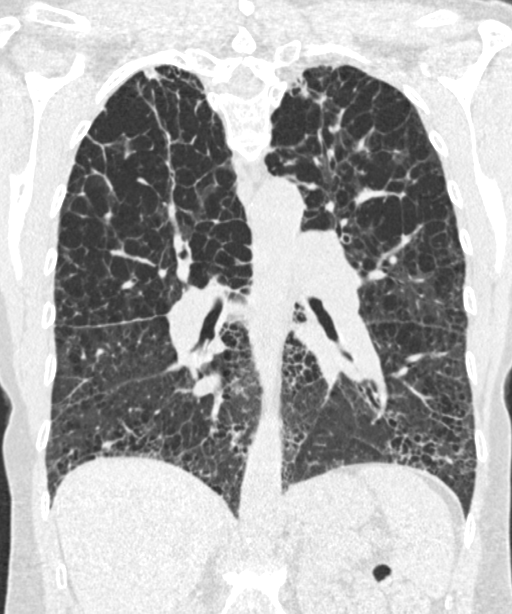

[14 of 40 positions shown; findings below may reference images not displayed]

FINDINGS: Cardiovascular: Heart size is normal. There is no significant
pericardial fluid, thickening or pericardial calcification. There is
aortic atherosclerosis, as well as atherosclerosis of the great
vessels of the mediastinum and the coronary arteries, including
calcified atherosclerotic plaque in the right coronary artery.

Mediastinum/Nodes: No pathologically enlarged mediastinal or hilar
lymph nodes. Please note that accurate exclusion of hilar adenopathy
is limited on noncontrast CT scans. Esophagus is unremarkable in
appearance. No axillary lymphadenopathy.

Lungs/Pleura: Multiple small pulmonary nodules are noted throughout
the lungs bilaterally. The most concerning of these nodules is in
the medial aspect of the right upper lobe (axial image 146 of series
3), with a volume derived mean diameter of 10.1 mm. No acute
consolidative airspace disease. No pleural effusions. Diffuse
bronchial wall thickening with severe centrilobular and paraseptal
emphysema.

Upper Abdomen: Aortic atherosclerosis.

Musculoskeletal: There are no aggressive appearing lytic or blastic
lesions noted in the visualized portions of the skeleton.
IMPRESSION: 1. Lung-RADS 4BS, suspicious. Additional imaging evaluation or
consultation with Pulmonology or Thoracic Surgery recommended.
2. The "S" modifier above refers to potentially clinically
significant non lung cancer related findings. Specifically, there is
aortic atherosclerosis, in addition to right coronary artery
disease. Please note that although the presence of coronary artery
calcium documents the presence of coronary artery disease, the
severity of this disease and any potential stenosis cannot be
assessed on this non-gated CT examination. Assessment for potential
risk factor modification, dietary therapy or pharmacologic therapy
may be warranted, if clinically indicated.
3. Mild diffuse bronchial wall thickening with severe centrilobular
and paraseptal emphysema; imaging findings suggestive of underlying
COPD.

These results will be called to the ordering clinician or
representative by the Radiologist Assistant, and communication
documented in the PACS or zVision Dashboard.

Aortic Atherosclerosis (91Y3T-31S.S) and Emphysema ([id number]-ATB.X).

## 2020-09-21 ENCOUNTER — Encounter: Payer: Self-pay | Admitting: *Deleted

## 2020-09-21 ENCOUNTER — Telehealth: Payer: Self-pay | Admitting: *Deleted

## 2020-09-21 NOTE — Telephone Encounter (Signed)
Attempted to reach patient via telephone re: scheduling annual lung screening scan. Left voicemail message for patient to call me back. 

## 2020-09-22 ENCOUNTER — Telehealth: Payer: Self-pay | Admitting: *Deleted

## 2020-09-22 NOTE — Telephone Encounter (Signed)
Made several unsuccessful attempts to reach patient via telephone re: scheduling her annual lung screening scan. Will mail letter today.

## 2022-03-06 ENCOUNTER — Inpatient Hospital Stay
Admit: 2022-03-06 | Discharge: 2022-03-06 | Disposition: A | Discharge status: Home / Self Care | Payer: Medicare Other | Attending: Internal Medicine | Admitting: Internal Medicine

## 2022-03-06 ENCOUNTER — Other Ambulatory Visit: Payer: Self-pay

## 2022-03-06 ENCOUNTER — Inpatient Hospital Stay
Admission: EM | Admit: 2022-03-06 | Discharge: 2022-03-09 | DRG: 189 | Disposition: A | Discharge status: Home / Self Care | Payer: Medicare Other | Attending: Internal Medicine | Admitting: Internal Medicine

## 2022-03-06 ENCOUNTER — Emergency Department: Payer: Medicare Other

## 2022-03-06 DIAGNOSIS — Z90710 Acquired absence of both cervix and uterus: Secondary | ICD-10-CM | POA: Diagnosis not present

## 2022-03-06 DIAGNOSIS — J449 Chronic obstructive pulmonary disease, unspecified: Secondary | ICD-10-CM | POA: Diagnosis present

## 2022-03-06 DIAGNOSIS — Z716 Tobacco abuse counseling: Secondary | ICD-10-CM

## 2022-03-06 DIAGNOSIS — R918 Other nonspecific abnormal finding of lung field: Secondary | ICD-10-CM | POA: Diagnosis present

## 2022-03-06 DIAGNOSIS — Z634 Disappearance and death of family member: Secondary | ICD-10-CM | POA: Diagnosis not present

## 2022-03-06 DIAGNOSIS — Z1152 Encounter for screening for COVID-19: Secondary | ICD-10-CM

## 2022-03-06 DIAGNOSIS — K219 Gastro-esophageal reflux disease without esophagitis: Secondary | ICD-10-CM | POA: Diagnosis present

## 2022-03-06 DIAGNOSIS — Z79891 Long term (current) use of opiate analgesic: Secondary | ICD-10-CM

## 2022-03-06 DIAGNOSIS — M7989 Other specified soft tissue disorders: Secondary | ICD-10-CM

## 2022-03-06 DIAGNOSIS — J432 Centrilobular emphysema: Secondary | ICD-10-CM | POA: Diagnosis present

## 2022-03-06 DIAGNOSIS — I272 Pulmonary hypertension, unspecified: Secondary | ICD-10-CM | POA: Diagnosis present

## 2022-03-06 DIAGNOSIS — R7989 Other specified abnormal findings of blood chemistry: Secondary | ICD-10-CM | POA: Diagnosis present

## 2022-03-06 DIAGNOSIS — I509 Heart failure, unspecified: Secondary | ICD-10-CM

## 2022-03-06 DIAGNOSIS — J9621 Acute and chronic respiratory failure with hypoxia: Secondary | ICD-10-CM | POA: Diagnosis present

## 2022-03-06 DIAGNOSIS — F17210 Nicotine dependence, cigarettes, uncomplicated: Secondary | ICD-10-CM | POA: Diagnosis present

## 2022-03-06 DIAGNOSIS — E876 Hypokalemia: Secondary | ICD-10-CM | POA: Diagnosis not present

## 2022-03-06 DIAGNOSIS — Z886 Allergy status to analgesic agent status: Secondary | ICD-10-CM

## 2022-03-06 DIAGNOSIS — I5031 Acute diastolic (congestive) heart failure: Secondary | ICD-10-CM | POA: Diagnosis present

## 2022-03-06 DIAGNOSIS — Z79899 Other long term (current) drug therapy: Secondary | ICD-10-CM | POA: Diagnosis not present

## 2022-03-06 DIAGNOSIS — J9601 Acute respiratory failure with hypoxia: Secondary | ICD-10-CM

## 2022-03-06 DIAGNOSIS — Z720 Tobacco use: Secondary | ICD-10-CM

## 2022-03-06 DIAGNOSIS — Z885 Allergy status to narcotic agent status: Secondary | ICD-10-CM

## 2022-03-06 DIAGNOSIS — G8929 Other chronic pain: Secondary | ICD-10-CM | POA: Diagnosis present

## 2022-03-06 DIAGNOSIS — Z881 Allergy status to other antibiotic agents status: Secondary | ICD-10-CM | POA: Diagnosis not present

## 2022-03-06 DIAGNOSIS — N1831 Chronic kidney disease, stage 3a: Secondary | ICD-10-CM | POA: Diagnosis present

## 2022-03-06 DIAGNOSIS — J8410 Pulmonary fibrosis, unspecified: Secondary | ICD-10-CM | POA: Diagnosis present

## 2022-03-06 DIAGNOSIS — R0602 Shortness of breath: Secondary | ICD-10-CM | POA: Diagnosis not present

## 2022-03-06 DIAGNOSIS — R911 Solitary pulmonary nodule: Secondary | ICD-10-CM | POA: Diagnosis present

## 2022-03-06 DIAGNOSIS — R600 Localized edema: Secondary | ICD-10-CM | POA: Diagnosis not present

## 2022-03-06 LAB — BASIC METABOLIC PANEL
Anion gap: 8 (ref 5–15)
BUN: 30 mg/dL — ABNORMAL HIGH (ref 8–23)
CO2: 24 mmol/L (ref 22–32)
Calcium: 9.4 mg/dL (ref 8.9–10.3)
Chloride: 109 mmol/L (ref 98–111)
Creatinine, Ser: 1.09 mg/dL — ABNORMAL HIGH (ref 0.44–1.00)
GFR, Estimated: 54 mL/min — ABNORMAL LOW (ref >60)
Glucose, Bld: 95 mg/dL (ref 70–99)
Potassium: 4.6 mmol/L (ref 3.5–5.1)
Sodium: 141 mmol/L (ref 135–145)

## 2022-03-06 LAB — CBC
HCT: 53.4 % — ABNORMAL HIGH (ref 36.0–46.0)
Hemoglobin: 17.2 g/dL — ABNORMAL HIGH (ref 12.0–15.0)
MCH: 30.8 pg (ref 26.0–34.0)
MCHC: 32.2 g/dL (ref 30.0–36.0)
MCV: 95.7 fL (ref 80.0–100.0)
Platelets: 169 10*3/uL (ref 150–400)
RBC: 5.58 MIL/uL — ABNORMAL HIGH (ref 3.87–5.11)
RDW: 13.9 % (ref 11.5–15.5)
WBC: 7.1 10*3/uL (ref 4.0–10.5)
nRBC: 0 % (ref 0.0–0.2)

## 2022-03-06 LAB — TROPONIN I (HIGH SENSITIVITY)
Troponin I (High Sensitivity): 22 ng/L — ABNORMAL HIGH (ref <18)
Troponin I (High Sensitivity): 22 ng/L — ABNORMAL HIGH (ref <18)

## 2022-03-06 LAB — URINALYSIS, ROUTINE W REFLEX MICROSCOPIC
Bacteria, UA: NONE SEEN
Bilirubin Urine: NEGATIVE
Glucose, UA: NEGATIVE mg/dL
Hgb urine dipstick: NEGATIVE
Ketones, ur: NEGATIVE mg/dL
Leukocytes,Ua: NEGATIVE
Nitrite: NEGATIVE
Protein, ur: NEGATIVE mg/dL
Specific Gravity, Urine: 1.013 (ref 1.005–1.030)
Squamous Epithelial / HPF: NONE SEEN (ref 0–5)
pH: 5 (ref 5.0–8.0)

## 2022-03-06 LAB — BRAIN NATRIURETIC PEPTIDE: B Natriuretic Peptide: 1100.6 pg/mL — ABNORMAL HIGH (ref 0.0–100.0)

## 2022-03-06 MED ORDER — DIAZEPAM 5 MG PO TABS
2.5000 mg | ORAL_TABLET | Freq: Two times a day (BID) | ORAL | Status: DC | PRN
  Administered 2022-03-06 – 2022-03-07 (×3): 2.5 mg via ORAL
  Filled 2022-03-06 (×3): qty 1

## 2022-03-06 MED ORDER — ONDANSETRON HCL 4 MG PO TABS: 4.0000 mg | ORAL_TABLET | Freq: Four times a day (QID) | ORAL | Status: DC | PRN

## 2022-03-06 MED ORDER — ALBUTEROL SULFATE (2.5 MG/3ML) 0.083% IN NEBU: 3.0000 mL | INHALATION_SOLUTION | Freq: Four times a day (QID) | RESPIRATORY_TRACT | Status: DC | PRN

## 2022-03-06 MED ORDER — ENOXAPARIN SODIUM 40 MG/0.4ML IJ SOSY
40.0000 mg | PREFILLED_SYRINGE | INTRAMUSCULAR | Status: DC
  Administered 2022-03-06 – 2022-03-08 (×3): 40 mg via SUBCUTANEOUS
  Filled 2022-03-06 (×3): qty 0.4

## 2022-03-06 MED ORDER — IOHEXOL 350 MG/ML SOLN
75.0000 mL | Freq: Once | INTRAVENOUS | Status: AC | PRN
  Administered 2022-03-06: 75 mL via INTRAVENOUS

## 2022-03-06 MED ORDER — ACETAMINOPHEN 650 MG RE SUPP: 650.0000 mg | Freq: Four times a day (QID) | RECTAL | Status: DC | PRN

## 2022-03-06 MED ORDER — HYDROCODONE-ACETAMINOPHEN 5-325 MG PO TABS: 1.0000 | ORAL_TABLET | ORAL | Status: DC | PRN

## 2022-03-06 MED ORDER — ACETAMINOPHEN 325 MG PO TABS
650.0000 mg | ORAL_TABLET | Freq: Four times a day (QID) | ORAL | Status: DC | PRN
  Administered 2022-03-07 (×2): 650 mg via ORAL
  Filled 2022-03-06 (×2): qty 2

## 2022-03-06 MED ORDER — FUROSEMIDE 10 MG/ML IJ SOLN
40.0000 mg | Freq: Once | INTRAMUSCULAR | Status: AC
  Administered 2022-03-06: 40 mg via INTRAVENOUS
  Filled 2022-03-06: qty 4

## 2022-03-06 MED ORDER — ONDANSETRON HCL 4 MG/2ML IJ SOLN: 4.0000 mg | Freq: Four times a day (QID) | INTRAMUSCULAR | Status: DC | PRN

## 2022-03-06 MED ORDER — FUROSEMIDE 10 MG/ML IJ SOLN
20.0000 mg | Freq: Two times a day (BID) | INTRAMUSCULAR | Status: DC
  Administered 2022-03-07: 20 mg via INTRAVENOUS
  Filled 2022-03-06: qty 4

## 2022-03-06 NOTE — ED Provider Triage Note (Signed)
Emergency Medicine Provider Triage Evaluation Note  ROSHELL BRIGHAM , a 71 y.o. female  was evaluated in triage.  Pt complains of low oxygen and swollen legs. Brought from [facility] with sats in "low 80's". Denies shortness of breath. No prior problems.   Review of Systems  Positive: No fever, cough, congestion,  Negative:   Physical Exam  BP (!) 138/104 (BP Location: Left Arm)   Pulse 75   Temp 97.9 F (36.6 C) (Oral)   Resp 16   SpO2 90%  Gen:   Awake, no distress  talkative, alert Resp:  Normal effort. Lungs clear.  MSK:   Lower extremities, ankle with edema bilaterally.   Other:    Medical Decision Making  Medically screening exam initiated at 2:10 PM.  Appropriate orders placed.  Eusebio Me was informed that the remainder of the evaluation will be completed by another provider, this initial triage assessment does not replace that evaluation, and the importance of remaining in the ED until their evaluation is complete.     Johnn Hai, PA-C 03/06/22 1410

## 2022-03-06 NOTE — ED Provider Notes (Signed)
[facility] Provider Note    Event Date/Time   First MD Initiated Contact with Patient 03/06/22 1647     (approximate)   History   Weakness   HPI  Holly Werner is a 71 y.o. female with a past medical history of tobacco use, pulmonary nodules, GERD, COPD who presents today for evaluation of shortness of breath and bilateral leg swelling that she noticed over the last 2 to 3 days.  She also reports that her oxygen has been in the 80s over the past few days.  She reports that she is feeling more winded very easily.  She also reports that she is having mild chest pain when she takes deep breaths, though no pain at rest.  She noticed that she had some left lateral thigh pain as well over the past 3 days, and now she has right lateral thigh pain.  She feels that both of her legs have become quite swollen.  She has gained 8 pounds over the last week.  She denies orthopnea.  She does not use any inhalers at baseline.  She reports that her husband passed away 9 days ago.  She went to [facility] today and they measured her oxygen saturation to be 86% on room air.  Patient Active Problem List   Diagnosis Date Noted   Chronic kidney disease, stage 3a ([place]) 03/06/2022   Acute CHF (congestive heart failure) ([place]) 03/06/2022   Acute respiratory failure with hypoxia ([place]) 03/06/2022   Cervical spine arthritis 12/05/2016   COPD (chronic obstructive pulmonary disease) ([place]) 12/05/2016   GERD (gastroesophageal reflux disease) 12/05/2016   IBS (irritable bowel syndrome) 12/05/2016   Nephrolithiasis 12/05/2016   Premature menopause 12/05/2016   Pulmonary nodule, right 12/05/2016   Appendicitis 12/05/2016   Acute appendicitis with localized peritonitis 12/05/2016   Acute appendicitis 12/03/2016   Personal history of tobacco use, presenting hazards to health 10/09/2016   Cervicalgia 10/28/2014   Myofacial muscle pain 10/28/2014   Neck pain 10/28/2014           Physical Exam   Triage Vital Signs: ED Triage Vitals  Enc Vitals Group     BP 03/06/22 1404 (!) 138/104     Pulse Rate 03/06/22 1404 75     Resp 03/06/22 1404 16     Temp 03/06/22 1407 97.9 F (36.6 C)     Temp Source 03/06/22 1407 Oral     SpO2 03/06/22 1404 90 %     Weight --      Height --      Head Circumference --      Peak Flow --      Pain Score 03/06/22 1404 0     Pain Loc --      Pain Edu? --      Excl. in [place]? --     Most recent vital signs: Vitals:   03/06/22 1800 03/06/22 1810  BP:    Pulse: 66   Resp:    Temp:  97.8 F (36.6 C)  SpO2: 95%     Physical Exam Vitals and nursing note reviewed.  Constitutional:      General: Awake and alert. No acute distress.    Appearance: Normal appearance. The patient is normal weight.  HENT:     Head: Normocephalic and atraumatic.     Mouth: Mucous membranes are moist.  Eyes:     General: PERRL. Normal EOMs        Right eye: No discharge.  Left eye: No discharge.     Conjunctiva/sclera: Conjunctivae normal.  Cardiovascular:     Rate and Rhythm: Normal rate and regular rhythm.     Pulses: Normal pulses.  Pulmonary:     Effort: Pulmonary effort is normal. No respiratory distress.     Breath sounds: Normal breath sounds.  On 2 L nasal cannula. Mild crackles bibasilarly. No wheezes noted.  Able to speak in complete sentences Abdominal:     Abdomen is soft. There is no abdominal tenderness. No rebound or guarding. No distention. Musculoskeletal:        General: No swelling. Normal range of motion.     Cervical back: Normal range of motion and neck supple.  2+ pitting edema to bilateral lower extremities.  Varicosities noted to bilateral ankles.  No skin color changes.  Palpable pedal pulses Skin:    General: Skin is warm and dry.     Capillary Refill: Capillary refill takes less than 2 seconds.     Findings: No rash.  Neurological:     Mental Status: The patient is awake and alert.      ED Results  / Procedures / Treatments   Labs (all labs ordered are listed, but only abnormal results are displayed) Labs Reviewed  BASIC METABOLIC PANEL - Abnormal; Notable for the following components:      Result Value   BUN 30 (*)    Creatinine, Ser 1.09 (*)    GFR, Estimated 54 (*)    All other components within normal limits  CBC - Abnormal; Notable for the following components:   RBC 5.58 (*)    Hemoglobin 17.2 (*)    HCT 53.4 (*)    All other components within normal limits  URINALYSIS, ROUTINE W REFLEX MICROSCOPIC - Abnormal; Notable for the following components:   Color, Urine YELLOW (*)    APPearance CLEAR (*)    All other components within normal limits  BRAIN NATRIURETIC PEPTIDE - Abnormal; Notable for the following components:   B Natriuretic Peptide 1,100.6 (*)    All other components within normal limits  TROPONIN I (HIGH SENSITIVITY) - Abnormal; Notable for the following components:   Troponin I (High Sensitivity) 22 (*)    All other components within normal limits  CBG MONITORING, ED  TROPONIN I (HIGH SENSITIVITY)  TROPONIN I (HIGH SENSITIVITY)     EKG     RADIOLOGY I independently reviewed and interpreted imaging and agree with radiologists findings.     PROCEDURES:  Critical Care performed:   Procedures   MEDICATIONS ORDERED IN ED: Medications  iohexol (OMNIPAQUE) 350 MG/ML injection 75 mL (75 mLs Intravenous Contrast Given 03/06/22 1710)  furosemide (LASIX) injection 40 mg (40 mg Intravenous Given 03/06/22 1805)     IMPRESSION / MDM / ASSESSMENT AND PLAN / ED COURSE  I reviewed the triage vital signs and the nursing notes.   Differential diagnosis includes, but is not limited to, new onset heart failure, myocardial infarction, COPD exacerbation, volume overload, pulmonary embolism, pneumonia, pneumothorax.  Patient is awake and alert, hemodynamically stable and afebrile, however she has an oxygen saturation of 90 percent on 2 L nasal cannula.  She  does appear to be volume overloaded and has 2+ pitting edema to her knees.  Given that she had reported thigh tenderness and is hypoxic, CTA was obtained for evaluation of pulmonary embolism, and this was negative for PE.  Clinical picture is consistent with volume overload/new onset heart failure.  She does have an 8  pound weight gain, is dyspneic with exertion, bilateral lower extremity swelling, crackles on lung exam and has new hypoxia.  Labs also reflect this with an elevated BNP to 1100, as well as an elevated troponin to 22.  EKG obtained in triage demonstrates significant artifact, therefore I ordered a repeat EKG. Given this happened after the death of her husband, I wonder if there is a component of Takotsubo's.  She was given '40mg'$  of IV Lasix.  I considered Solu-Medrol given her history of COPD, however she really does not have any wheezes on exam and her clinical picture is more consistent with volume overload rather than COPD exacerbation.  I recommended admission to the hospitalist service.  Patient and her family members agree with this plan. Patient discussed with and accepted by Dr. Damita Dunnings.  Patient's presentation is most consistent with acute presentation with potential threat to life or bodily function.      FINAL CLINICAL IMPRESSION(S) / ED DIAGNOSES   Final diagnoses:  Acute heart failure, unspecified heart failure type (HCC)  Elevated troponin  Swelling of lower extremity     Rx / DC Orders   ED Discharge Orders     None        Note:  This document was prepared using Dragon voice recognition software and may include unintentional dictation errors.   Emeline Gins 03/06/22 1850    Rada Hay, MD 03/06/22 2249

## 2022-03-06 NOTE — Assessment & Plan Note (Signed)
Patient presents with dyspnea on exertion, O2 sat at home in the mid 80s, 88% on arrival improving to the low to mid 90s on 2  Chest x-ray showed severe and progressive emphysema CTA negative for PE but shows pulmonary artery hypertension Suspect multifactorial, possibly pulmonary artery hypertension, cor pulmonale, new onset CHF Supplemental O2 and wean as tolerated We will treat acute CHF

## 2022-03-06 NOTE — ED Notes (Addendum)
Pt oxygen fluctuating between 88% and 91% on RA. Pt states her oxygen drops when she is up and ambulating. Pt is a pack a day smoker. Pt placed on 2L for comfort.

## 2022-03-06 NOTE — Assessment & Plan Note (Addendum)
Patient clinically fluid overloaded and reports an 8 pound weight gain.  And with bilateral lower extremity edema, BNP over thousand IV Lasix, start beta-blockers prior to discharge Echocardiogram Daily weights with intake and output monitoring Continue to trend troponins to evaluate for ACS Cardiology consult

## 2022-03-06 NOTE — Assessment & Plan Note (Signed)
PPI as needed

## 2022-03-06 NOTE — Assessment & Plan Note (Signed)
Not acutely exacerbated.  No wheezing or rhonchi on exam DuoNebs as needed

## 2022-03-06 NOTE — ED Triage Notes (Signed)
Pt here with low oxygen and a swollen right leg. Pt sats were in the low 80s at [facility]. Pt states her husband passed away 2 weeks ago and she is now having the same symptoms that he was having. Pt states this has been going on for a while.

## 2022-03-06 NOTE — Assessment & Plan Note (Signed)
Renal function at baseline 

## 2022-03-06 NOTE — H&P (Incomplete)
History and Physical    Patient: Holly Werner:[id number] DOB: July 09, 1950 DOA: 03/06/2022 DOS: the patient was seen and examined on 03/06/2022 PCP: Marinda Elk, MD  Patient coming from: Home  Chief Complaint:  Chief Complaint  Patient presents with   Weakness    HPI: Holly Werner is a 71 y.o. female with medical history significant for COPD not on home oxygen, CKD stage IIIa, GERD, nicotine dependence and a known pulmonary nodule who presents to the ED with several day history of shortness of breath on exertion and leg swelling.  She has been checking her oxygen saturation over the past few days and has been in the mid 80s.  She has chest discomfort with deep breathing.  She reports pain in her left thigh but swelling in both her legs as well as an 8 pound weight gain.  Denies orthopnea.  Denies cough, fever or chills recently lost her husband just over a week ago.  She otherwise denies feeling unwell.  Denies nausea, vomiting or diarrhea or abdominal pain ED course and data review: BP 138/104 with O2 sat 90% on 2 L.  Troponin 22 and BNP 1106.  Creatinine 1.09 which is her baseline WBC normal.  EKG, personally viewed and interpreted no acute ST-T wave changes. CTA chest negative for PE and showing the following:  Patient given dose of Lasix and hospitalist consulted for admission.   Review of Systems: As mentioned in the history of present illness. All other systems reviewed and are negative.  Past Medical History:  Diagnosis Date   Chronic pain    Emphysema lung (HCC)    GERD (gastroesophageal reflux disease)    Thoracic outlet syndrome    Past Surgical History:  Procedure Laterality Date   ABDOMINAL HYSTERECTOMY     partial   LAPAROSCOPIC APPENDECTOMY N/A 12/05/2016   Procedure: APPENDECTOMY LAPAROSCOPIC;  Surgeon: Vickie Epley, MD;  Location: [facility] ORS;  Service: General;  Laterality: N/A;   THORACIC OUTLET SURGERY     Social History:  reports that she has been  smoking cigarettes. She has a 38.25 pack-year smoking history. She has never used smokeless tobacco. She reports that she does not drink alcohol and does not use drugs.  Allergies  Allergen Reactions   Escitalopram Oxalate Palpitations    Loose stools   Amoxicillin-Pot Clavulanate     Other reaction(s): Vomiting   Aspirin Other (See Comments)    Stomach hurt   Codeine Other (See Comments)    stomach hurt   Doxycycline Swelling   Levofloxacin     Other reaction(s): Dizziness    Family History  Problem Relation Age of Onset   Vision loss Mother    Macular degeneration Mother    Dementia Mother    Kidney disease Father    COPD Sister    Uterine cancer Sister    Lung cancer Maternal Aunt    COPD Sister    Colon cancer Maternal Aunt    Ovarian cancer Maternal Aunt     Prior to Admission medications   Medication Sig Start Date End Date Taking? Authorizing Provider  chlorpheniramine-HYDROcodone (TUSSIONEX) 10-8 MG/5ML SUER SHAKE LQ AND TK 5 ML PO Q 12 H PRF COUGH 03/13/18  Yes [provider]  diazepam (VALIUM) 5 MG tablet Take 2.5 mg by mouth 2 (two) times daily as needed for anxiety.   Yes [provider]  albuterol (PROVENTIL HFA;VENTOLIN HFA) 108 (90 Base) MCG/ACT inhaler Inhale 2 puffs into the lungs every  6 (six) hours as needed for wheezing or shortness of breath.    [provider]  traMADol (ULTRAM) 50 MG tablet Take 1 tablet (50 mg total) by mouth every 6 (six) hours as needed for severe pain. Patient not taking: Reported on 05/09/2018 12/06/16   Vickie Epley, MD    Physical Exam: Vitals:   03/06/22 1700 03/06/22 1745 03/06/22 1800 03/06/22 1810  BP: (!) 156/91     Pulse: 66 67 66   Resp: 18     Temp:    97.8 F (36.6 C)  TempSrc:    Oral  SpO2: 94% 92% 95%    Physical Exam  Labs on Admission: I have personally reviewed following labs and imaging studies  CBC: Recent Labs  Lab 03/06/22 1409  WBC 7.1  HGB 17.2*  HCT 53.4*   MCV 95.7  PLT 387   Basic Metabolic Panel: Recent Labs  Lab 03/06/22 1409  NA 141  K 4.6  CL 109  CO2 24  GLUCOSE 95  BUN 30*  CREATININE 1.09*  CALCIUM 9.4   GFR: CrCl cannot be calculated (Unknown ideal weight.). Liver Function Tests: No results for input(s): "AST", "ALT", "ALKPHOS", "BILITOT", "PROT", "ALBUMIN" in the last 168 hours. No results for input(s): "LIPASE", "AMYLASE" in the last 168 hours. No results for input(s): "AMMONIA" in the last 168 hours. Coagulation Profile: No results for input(s): "INR", "PROTIME" in the last 168 hours. Cardiac Enzymes: No results for input(s): "CKTOTAL", "CKMB", "CKMBINDEX", "TROPONINI" in the last 168 hours. BNP (last 3 results) No results for input(s): "PROBNP" in the last 8760 hours. HbA1C: No results for input(s): "HGBA1C" in the last 72 hours. CBG: No results for input(s): "GLUCAP" in the last 168 hours. Lipid Profile: No results for input(s): "CHOL", "HDL", "LDLCALC", "TRIG", "CHOLHDL", "LDLDIRECT" in the last 72 hours. Thyroid Function Tests: No results for input(s): "TSH", "T4TOTAL", "FREET4", "T3FREE", "THYROIDAB" in the last 72 hours. Anemia Panel: No results for input(s): "VITAMINB12", "FOLATE", "FERRITIN", "TIBC", "IRON", "RETICCTPCT" in the last 72 hours. Urine analysis:    Component Value Date/Time   COLORURINE YELLOW (A) 03/06/2022 1409   APPEARANCEUR CLEAR (A) 03/06/2022 1409   LABSPEC 1.013 03/06/2022 1409   PHURINE 5.0 03/06/2022 1409   GLUCOSEU NEGATIVE 03/06/2022 1409   HGBUR NEGATIVE 03/06/2022 1409   BILIRUBINUR NEGATIVE 03/06/2022 1409   KETONESUR NEGATIVE 03/06/2022 1409   PROTEINUR NEGATIVE 03/06/2022 1409   NITRITE NEGATIVE 03/06/2022 1409   LEUKOCYTESUR NEGATIVE 03/06/2022 1409    Radiological Exams on Admission: CT Angio Chest Pulmonary Embolism (PE) W or WO Contrast  Result Date: 03/06/2022 CLINICAL DATA:  Hypoxia, right leg swelling EXAM: CT ANGIOGRAPHY CHEST WITH CONTRAST TECHNIQUE:  Multidetector CT imaging of the chest was performed using the standard protocol during bolus administration of intravenous contrast. Multiplanar CT image reconstructions and MIPs were obtained to evaluate the vascular anatomy. RADIATION DOSE REDUCTION: This exam was performed according to the departmental dose-optimization program which includes automated exposure control, adjustment of the mA and/or kV according to patient size and/or use of iterative reconstruction technique. CONTRAST:  67m OMNIPAQUE IOHEXOL 350 MG/ML SOLN COMPARISON:  Chest radiograph dated 03/06/2022. CT chest dated 10/13/2019. FINDINGS: Cardiovascular: Satisfactory opacification the bilateral pulmonary arteries to the segmental level. Evaluation is mildly constrained by respiratory motion. Within that constraint, there is no evidence of pulmonary embolism. Apparent filling defect in the lobar right upper lobe pulmonary artery (series 6/image 173) reflects streak artifact from a dense SVC. Enlargement of the main pulmonary artery,  suggesting pulmonary arterial hypertension. Study is not tailored for evaluation of the thoracic aorta. Within that constraint, there is no evidence of thoracic aortic aneurysm or dissection. Atherosclerotic calcifications of the arch. The heart is normal in size.  No pericardial effusion. Coronary atherosclerosis of the right coronary artery. Mediastinum/Nodes: No suspicious mediastinal lymphadenopathy. Visualized thyroid is notable for a 9 mm right thyroid nodule (series 4/image 18). Not clinically significant; no follow-up imaging recommended (ref: J Am Coll Radiol. 2015 Feb;12(2): 143-50). Lungs/Pleura: Severe centrilobular and paraseptal emphysematous changes, upper lung predominant. Right apical pleural-parenchymal scarring. 10 mm subpleural right lower lobe pulmonary nodule (series 5/image 105), unchanged from 2021, benign. No follow-up is recommended per Fleischner Society guidelines. No focal consolidation.  No pleural effusion or pneumothorax. Upper Abdomen: Visualized upper abdomen is grossly unremarkable, noting vascular calcifications. Musculoskeletal: Visualized osseous structures are within normal limits. Review of the MIP images confirms the above findings. IMPRESSION: No evidence of pulmonary embolism. Enlargement of the main pulmonary artery, suggesting pulmonary arterial hypertension. 10 mm subpleural right lower lobe pulmonary nodule, unchanged from 2021, benign. No follow-up is recommended per Fleischner Society guidelines. Aortic Atherosclerosis (ICD10-I70.0) and Emphysema (ICD10-J43.9). Electronically Signed   By: Julian Hy M.D.   On: 03/06/2022 17:33   DG Chest 2 View  Result Date: 03/06/2022 CLINICAL DATA:  low oxygen sats EXAM: CHEST - 2 VIEW COMPARISON:  Chest x-ray 12/04/2011. FINDINGS: Progressive emphysema with coarsening lung markings and blebs in the lung apices. No definite superimposed consolidation. No visible pleural effusions or pneumothorax. Cardiomediastinal silhouette is within normal limits. No acute osseous abnormality. IMPRESSION: Severe and progressive emphysema. No definite acute superimposed abnormality. Electronically Signed   By: Margaretha Sheffield M.D.   On: 03/06/2022 14:30     Data Reviewed: Relevant notes from primary care and specialist visits, past discharge summaries as available in EHR, including Care Everywhere. Prior diagnostic testing as pertinent to current admission diagnoses Updated medications and problem lists for reconciliation ED course, including vitals, labs, imaging, treatment and response to treatment Triage notes, nursing and pharmacy notes and ED provider's notes Notable results as noted in HPI   Assessment and Plan: * Acute respiratory failure with hypoxia ([place]) Patient presents with dyspnea on exertion, O2 sat at home in the mid 80s, 88% on arrival improving to the low to mid 90s on 2  Chest x-ray showed severe and progressive  emphysema CTA negative for PE but shows pulmonary artery hypertension Suspect multifactorial, possibly pulmonary artery hypertension, cor pulmonale, new onset CHF Supplemental O2 and wean as tolerated We will treat acute CHF    Acute CHF (congestive heart failure) ([place]) Patient clinically fluid overloaded and reports an 8 pound weight gain.  And with bilateral lower extremity edema, BNP over thousand IV Lasix, start beta-blockers prior to discharge Echocardiogram Daily weights with intake and output monitoring Continue to trend troponins to evaluate for ACS Cardiology consult   Chronic kidney disease, stage 3a ([place]) Renal function at baseline  GERD (gastroesophageal reflux disease) PPI as needed  COPD (chronic obstructive pulmonary disease) ([place]) Not acutely exacerbated.  No wheezing or rhonchi on exam DuoNebs as needed         DVT prophylaxis: Lovenox  Consults: cardiology [facility]  Advance Care Planning:   Code Status: Prior   Family Communication: none  Disposition Plan: Back to previous home environment  Severity of Illness: The appropriate patient status for this patient is INPATIENT. Inpatient status is judged to be reasonable and necessary in order to provide the  required intensity of service to ensure the patient's safety. The patient's presenting symptoms, physical exam findings, and initial radiographic and laboratory data in the context of their chronic comorbidities is felt to place them at high risk for further clinical deterioration. Furthermore, it is not anticipated that the patient will be medically stable for discharge from the hospital within 2 midnights of admission.   * I certify that at the point of admission it is my clinical judgment that the patient will require inpatient hospital care spanning beyond 2 midnights from the point of admission due to high intensity of service, high risk for further deterioration and high frequency of surveillance  required.*  Author: Athena Masse, MD 03/06/2022 7:54 PM  For on call review [email or web address].

## 2022-03-07 DIAGNOSIS — R0602 Shortness of breath: Secondary | ICD-10-CM | POA: Diagnosis not present

## 2022-03-07 DIAGNOSIS — J9601 Acute respiratory failure with hypoxia: Secondary | ICD-10-CM | POA: Diagnosis not present

## 2022-03-07 DIAGNOSIS — I272 Pulmonary hypertension, unspecified: Secondary | ICD-10-CM

## 2022-03-07 DIAGNOSIS — R600 Localized edema: Secondary | ICD-10-CM

## 2022-03-07 DIAGNOSIS — Z720 Tobacco use: Secondary | ICD-10-CM

## 2022-03-07 DIAGNOSIS — M7989 Other specified soft tissue disorders: Secondary | ICD-10-CM | POA: Insufficient documentation

## 2022-03-07 DIAGNOSIS — J449 Chronic obstructive pulmonary disease, unspecified: Secondary | ICD-10-CM | POA: Diagnosis not present

## 2022-03-07 LAB — ECHOCARDIOGRAM COMPLETE
Area-P 1/2: 3.42 cm2
Height: 62.992 in
S' Lateral: 2.3 cm
Weight: 2024.7 oz

## 2022-03-07 LAB — CBC
HCT: 53.8 % — ABNORMAL HIGH (ref 36.0–46.0)
Hemoglobin: 17.7 g/dL — ABNORMAL HIGH (ref 12.0–15.0)
MCH: 31.3 pg (ref 26.0–34.0)
MCHC: 32.9 g/dL (ref 30.0–36.0)
MCV: 95.2 fL (ref 80.0–100.0)
Platelets: 169 10*3/uL (ref 150–400)
RBC: 5.65 MIL/uL — ABNORMAL HIGH (ref 3.87–5.11)
RDW: 13.6 % (ref 11.5–15.5)
WBC: 8.2 10*3/uL (ref 4.0–10.5)
nRBC: 0 % (ref 0.0–0.2)

## 2022-03-07 LAB — TROPONIN I (HIGH SENSITIVITY): Troponin I (High Sensitivity): 23 ng/L — ABNORMAL HIGH (ref <18)

## 2022-03-07 LAB — CREATININE, SERUM
Creatinine, Ser: 0.97 mg/dL (ref 0.44–1.00)
GFR, Estimated: >60 mL/min (ref >60)

## 2022-03-07 MED ORDER — FUROSEMIDE 10 MG/ML IJ SOLN
20.0000 mg | Freq: Three times a day (TID) | INTRAMUSCULAR | Status: DC
  Administered 2022-03-07: 20 mg via INTRAVENOUS

## 2022-03-07 MED ORDER — ENSURE ENLIVE PO LIQD
237.0000 mL | Freq: Two times a day (BID) | ORAL | Status: DC
  Administered 2022-03-07 – 2022-03-09 (×3): 237 mL via ORAL

## 2022-03-07 MED ORDER — FUROSEMIDE 10 MG/ML IJ SOLN: 20.0000 mg | Freq: Two times a day (BID) | INTRAMUSCULAR | Status: DC

## 2022-03-07 MED ORDER — FUROSEMIDE 40 MG PO TABS
40.0000 mg | ORAL_TABLET | Freq: Every day | ORAL | Status: DC
  Administered 2022-03-08 – 2022-03-09 (×2): 40 mg via ORAL
  Filled 2022-03-07 (×2): qty 1

## 2022-03-07 MED ORDER — ADULT MULTIVITAMIN W/MINERALS CH
1.0000 | ORAL_TABLET | Freq: Every day | ORAL | Status: DC
  Administered 2022-03-07 – 2022-03-09 (×3): 1 via ORAL
  Filled 2022-03-07 (×3): qty 1

## 2022-03-07 NOTE — Progress Notes (Signed)
PROGRESS NOTE    Holly Werner  TOI:[id number] DOB: [birth year] DOA: 03/06/2022 PCP: Holly Elk, MD    Brief Narrative:  Holly Werner is a 71 y.o. female with medical history significant for COPD not on home oxygen, CKD stage IIIa, GERD, nicotine dependence and a known pulmonary nodule who presents to the ED with several day history of shortness of breath on exertion and leg swelling.  She has been checking her oxygen saturation over the past few days and has been in the mid 80s.  Patient denies ever having to use inhalers.  States she was hospitalized about 12 years ago at [facility] with a lung problem and they could not arrive at a diagnosis.  She reports pain in her left thigh but swelling in both her legs as well as an 8 pound weight gain.  Denies orthopnea.  Denies cough, fever or chills recently lost her husband just over a week ago.  She otherwise denies feeling unwell.  Denies nausea, vomiting or diarrhea or abdominal pain ED course and data review: BP 138/104 with O2 sat 90% on 2 L.  Troponin 22 and BNP 1106.  Creatinine 1.09 which is her baseline WBC normal.  EKG, personally viewed and interpreted no acute ST-T wave changes. CTA chest negative for PE and showing the following:   Patient given dose of Lasix and hospitalist consulted for admission.     Consultants:  cardiology  Procedures:   Antimicrobials:      Subjective: Sob little better. No cp  Objective: Vitals:   03/07/22 0056 03/07/22 0400 03/07/22 1300 03/07/22 1600  BP: 134/69 137/68 132/70 138/75  Pulse: 60 65 75 84  Resp: '18 18 20 '$ (!) 22  Temp: 98 F (36.7 C) 98 F (36.7 C) 98.2 F (36.8 C) 98.7 F (37.1 C)  TempSrc: Oral Oral Oral Oral  SpO2: 97% 95% 94% 95%  Weight:      Height:        Intake/Output Summary (Last 24 hours) at 03/07/2022 1750 Last data filed at 03/07/2022 1100 Gross per 24 hour  Intake --  Output 1600 ml  Net -1600 ml   Filed Weights   03/06/22 2000  Weight: 57.4 kg     Examination: Calm, NAD Decrease bs at bases, no wheezing Reg s1/s2 no gallop Soft benign +bs Mild LE edema b/l Aaoxox3  Mood and affect appropriate in current setting     Data Reviewed: I have personally reviewed following labs and imaging studies  CBC: Recent Labs  Lab 03/06/22 1409 03/06/22 2321  WBC 7.1 8.2  HGB 17.2* 17.7*  HCT 53.4* 53.8*  MCV 95.7 95.2  PLT 169 833   Basic Metabolic Panel: Recent Labs  Lab 03/06/22 1409 03/06/22 2321  NA 141  --   K 4.6  --   CL 109  --   CO2 24  --   GLUCOSE 95  --   BUN 30*  --   CREATININE 1.09* 0.97  CALCIUM 9.4  --    GFR: Estimated Creatinine Clearance: 42.7 mL/min (by C-G formula based on SCr of 0.97 mg/dL). Liver Function Tests: No results for input(s): "AST", "ALT", "ALKPHOS", "BILITOT", "PROT", "ALBUMIN" in the last 168 hours. No results for input(s): "LIPASE", "AMYLASE" in the last 168 hours. No results for input(s): "AMMONIA" in the last 168 hours. Coagulation Profile: No results for input(s): "INR", "PROTIME" in the last 168 hours. Cardiac Enzymes: No results for input(s): "CKTOTAL", "CKMB", "CKMBINDEX", "TROPONINI" in the last 168  hours. BNP (last 3 results) No results for input(s): "PROBNP" in the last 8760 hours. HbA1C: No results for input(s): "HGBA1C" in the last 72 hours. CBG: No results for input(s): "GLUCAP" in the last 168 hours. Lipid Profile: No results for input(s): "CHOL", "HDL", "LDLCALC", "TRIG", "CHOLHDL", "LDLDIRECT" in the last 72 hours. Thyroid Function Tests: No results for input(s): "TSH", "T4TOTAL", "FREET4", "T3FREE", "THYROIDAB" in the last 72 hours. Anemia Panel: No results for input(s): "VITAMINB12", "FOLATE", "FERRITIN", "TIBC", "IRON", "RETICCTPCT" in the last 72 hours. Sepsis Labs: No results for input(s): "PROCALCITON", "LATICACIDVEN" in the last 168 hours.  No results found for this or any previous visit (from the past 240 hour(s)).       Radiology  Studies: ECHOCARDIOGRAM COMPLETE  Result Date: 03/07/2022    ECHOCARDIOGRAM REPORT   Patient Name:   Holly Werner Date of Exam: 03/06/2022 Medical Rec #:  [id number]    Height:       63.0 in Accession #:    [id number]   Weight:       126.5 lb Date of Birth:  Jan 10, 1951     BSA:          1.592 m Patient Age:    78 years     BP:           125/70 mmHg Patient Gender: F            HR:           63 bpm. Exam Location:  [facility] Procedure: 2D Echo, Cardiac Doppler and Color Doppler Indications:     T65.46 Acute Diastolic CHF  History:         Patient has no prior history of Echocardiogram examinations.                  Signs/Symptoms:Chest Pain. Lung Emphysema.  Sonographer:     Holly Werner RDCS Referring Phys:  [id number] Holly Werner Diagnosing Phys: Holly Kida MD IMPRESSIONS  1. Left ventricular ejection fraction, by estimation, is 60 to 65%. The left ventricle has normal function. The left ventricle demonstrates regional wall motion abnormalities (see scoring diagram/findings for description). Left ventricular diastolic parameters are consistent with Grade I diastolic dysfunction (impaired relaxation).  2. Right ventricular systolic function is low normal. The right ventricular size is mildly enlarged. Mildly increased right ventricular wall thickness.  3. The mitral valve is normal in structure. Mild mitral valve regurgitation.  4. The aortic valve is normal in structure. Aortic valve regurgitation is not visualized. FINDINGS  Left Ventricle: Left ventricular ejection fraction, by estimation, is 60 to 65%. The left ventricle has normal function. The left ventricle demonstrates regional wall motion abnormalities. The left ventricular internal cavity size was normal in size. There is no left ventricular hypertrophy. Left ventricular diastolic parameters are consistent with Grade I diastolic dysfunction (impaired relaxation). Right Ventricle: The right ventricular size is mildly enlarged. Mildly increased  right ventricular wall thickness. Right ventricular systolic function is low normal. Left Atrium: Left atrial size was normal in size. Right Atrium: Right atrial size was normal in size. Pericardium: There is no evidence of pericardial effusion. Mitral Valve: The mitral valve is normal in structure. Mild mitral valve regurgitation. Tricuspid Valve: The tricuspid valve is normal in structure. Tricuspid valve regurgitation is trivial. Aortic Valve: The aortic valve is normal in structure. Aortic valve regurgitation is not visualized. Pulmonic Valve: The pulmonic valve was normal in structure. Pulmonic valve regurgitation is not visualized. Aorta: The ascending aorta was  not well visualized. IAS/Shunts: No atrial level shunt detected by color flow Doppler.  LEFT VENTRICLE PLAX 2D LVIDd:         3.50 cm   Diastology LVIDs:         2.30 cm   LV e' medial:    4.52 cm/s LV PW:         0.80 cm   LV E/e' medial:  13.5 LV IVS:        0.80 cm   LV e' lateral:   6.96 cm/s LVOT diam:     1.50 cm   LV E/e' lateral: 8.7 LV SV:         43 LV SV Index:   27 LVOT Area:     1.77 cm  RIGHT VENTRICLE             IVC RV Basal diam:  3.90 cm     IVC diam: 1.90 cm RV S prime:     11.50 cm/s TAPSE (M-mode): 2.2 cm LEFT ATRIUM             Index        RIGHT ATRIUM           Index LA diam:        3.70 cm 2.32 cm/m   RA Area:     15.00 cm LA Vol (A2C):   17.4 ml 10.93 ml/m  RA Volume:   44.80 ml  28.14 ml/m LA Vol (A4C):   23.0 ml 14.45 ml/m LA Biplane Vol: 20.9 ml 13.13 ml/m  AORTIC VALVE LVOT Vmax:   128.00 cm/s LVOT Vmean:  85.650 cm/s LVOT VTI:    0.246 m  AORTA Ao Root diam: 2.70 cm MITRAL VALVE               TRICUSPID VALVE MV Area (PHT): 3.42 cm    TR Peak grad:   65.0 mmHg MV Decel Time: 222 msec    TR Vmax:        403.00 cm/s MV E velocity: 60.80 cm/s MV A velocity: 81.20 cm/s  SHUNTS MV E/A ratio:  0.75        Systemic VTI:  0.25 m                            Systemic Diam: 1.50 cm Holly Kida MD Electronically signed by  Holly Kida MD Signature Date/Time: 03/07/2022/4:24:13 PM    Final    CT Angio Chest Pulmonary Embolism (PE) W or WO Contrast  Result Date: 03/06/2022 CLINICAL DATA:  Hypoxia, right leg swelling EXAM: CT ANGIOGRAPHY CHEST WITH CONTRAST TECHNIQUE: Multidetector CT imaging of the chest was performed using the standard protocol during bolus administration of intravenous contrast. Multiplanar CT image reconstructions and MIPs were obtained to evaluate the vascular anatomy. RADIATION DOSE REDUCTION: This exam was performed according to the departmental dose-optimization program which includes automated exposure control, adjustment of the mA and/or kV according to patient size and/or use of iterative reconstruction technique. CONTRAST:  31m OMNIPAQUE IOHEXOL 350 MG/ML SOLN COMPARISON:  Chest radiograph dated 03/06/2022. CT chest dated 10/13/2019. FINDINGS: Cardiovascular: Satisfactory opacification the bilateral pulmonary arteries to the segmental level. Evaluation is mildly constrained by respiratory motion. Within that constraint, there is no evidence of pulmonary embolism. Apparent filling defect in the lobar right upper lobe pulmonary artery (series 6/image 173) reflects streak artifact from a dense SVC. Enlargement of the main pulmonary artery, suggesting pulmonary arterial  hypertension. Study is not tailored for evaluation of the thoracic aorta. Within that constraint, there is no evidence of thoracic aortic aneurysm or dissection. Atherosclerotic calcifications of the arch. The heart is normal in size.  No pericardial effusion. Coronary atherosclerosis of the right coronary artery. Mediastinum/Nodes: No suspicious mediastinal lymphadenopathy. Visualized thyroid is notable for a 9 mm right thyroid nodule (series 4/image 18). Not clinically significant; no follow-up imaging recommended (ref: J Am Coll Radiol. 2015 Feb;12(2): 143-50). Lungs/Pleura: Severe centrilobular and paraseptal emphysematous changes,  upper lung predominant. Right apical pleural-parenchymal scarring. 10 mm subpleural right lower lobe pulmonary nodule (series 5/image 105), unchanged from 2021, benign. No follow-up is recommended per Fleischner Society guidelines. No focal consolidation. No pleural effusion or pneumothorax. Upper Abdomen: Visualized upper abdomen is grossly unremarkable, noting vascular calcifications. Musculoskeletal: Visualized osseous structures are within normal limits. Review of the MIP images confirms the above findings. IMPRESSION: No evidence of pulmonary embolism. Enlargement of the main pulmonary artery, suggesting pulmonary arterial hypertension. 10 mm subpleural right lower lobe pulmonary nodule, unchanged from 2021, benign. No follow-up is recommended per Fleischner Society guidelines. Aortic Atherosclerosis (ICD10-I70.0) and Emphysema (ICD10-J43.9). Electronically Signed   By: Julian Hy M.D.   On: 03/06/2022 17:33   DG Chest 2 View  Result Date: 03/06/2022 CLINICAL DATA:  low oxygen sats EXAM: CHEST - 2 VIEW COMPARISON:  Chest x-ray 12/04/2011. FINDINGS: Progressive emphysema with coarsening lung markings and blebs in the lung apices. No definite superimposed consolidation. No visible pleural effusions or pneumothorax. Cardiomediastinal silhouette is within normal limits. No acute osseous abnormality. IMPRESSION: Severe and progressive emphysema. No definite acute superimposed abnormality. Electronically Signed   By: Margaretha Sheffield M.D.   On: 03/06/2022 14:30        Scheduled Meds:  enoxaparin (LOVENOX) injection  40 mg Subcutaneous Q24H   feeding supplement  237 mL Oral BID BM   [START ON 03/08/2022] furosemide  40 mg Oral Daily   multivitamin with minerals  1 tablet Oral Daily   Continuous Infusions:  Assessment & Plan:   Principal Problem:   Acute respiratory failure with hypoxia (HCC) Active Problems:   Acute heart failure (HCC)   COPD (chronic obstructive pulmonary disease)  (HCC)   GERD (gastroesophageal reflux disease)   Chronic kidney disease, stage 3a (HCC)   Tobacco abuse  Acute respiratory failure with hypoxia ([place]) Patient presents with dyspnea on exertion, O2 sat at home in the mid 80s, 88% on arrival improving to the low to mid 90s on 2  Chest x-ray showed severe and progressive emphysema CTA negative for PE but shows pulmonary artery hypertension Suspect multifactorial, possibly pulmonary artery hypertension, cor pulmonale, new onset CHF 10/11 wean 02 as tolerated Lasix        Acute CHF (congestive heart failure) [facility]) Cardiology consulted Echo pending On IV Lasix switch to p.o. now     Chronic kidney disease, stage 3a ([place]) Renal function at baseline   GERD (gastroesophageal reflux disease) PPI   COPD (chronic obstructive pulmonary disease) ([place]) Not acutely exacerbated.  No wheezing or rhonchi on exam DuoNebs as needed   Tobacco abuse Discussed with patient about tobacco abuse and counseled her to stop      DVT prophylaxis: Lovenox Code Status: Full Family Communication: Son Disposition Plan:  Status is: Inpatient Remains inpatient appropriate because: IV treatment      LOS: 1 day   Time spent: 35 minutes    Nolberto Hanlon, MD [facility] Pager [phone]  If 7PM-7AM, please  contact night-coverage 03/07/2022, 5:50 PM

## 2022-03-07 NOTE — Discharge Instructions (Signed)

## 2022-03-07 NOTE — Consult Note (Signed)
Cardiology Consultation   Patient ID: ALONDA WEABER MRN: [id number]; DOB: [birth year]  Admit date: 03/06/2022 Date of Consult: 03/07/2022  PCP:  Marinda Elk, MD   [place] Providers Cardiologist: New,[place] rounding Click here to update MD or APP on Care Team, Refresh:1}     Patient Profile:   Holly Werner is a 71 y.o. female with a hx of COPD, current smoker who is being seen 03/07/2022 for the evaluation of leg edema at the request of Dr. Kurtis Bushman.  History of Present Illness:   Holly Werner COPD, current smoker x50+ years presenting with leg edema and hypoxia.  Patient states having worsening leg edema over 2 weeks period.  She states checking her oxygen at home, saturations were noted to be in the 80s.  She denies chest pain, complains of leg cramping.  Denies any history of heart disease.  Presented to her PCP who recommended she come to the ED for evaluation.  In the ED upon admission, EKG showed sinus rhythm, troponin was 22, 22.  BNP 1100, creatinine normal 0.9.  Chest CT was obtained with no evidence for PE, but enlargement of the main PA noted suggesting pulmonary hypertension.  Severe centrilobular emphysema noted.  Started on IV Lasix, with improvement in symptoms and also leg swelling.  Currently on 2 L oxygen for support.   Past Medical History:  Diagnosis Date   Chronic pain    Emphysema lung (HCC)    GERD (gastroesophageal reflux disease)    Thoracic outlet syndrome     Past Surgical History:  Procedure Laterality Date   ABDOMINAL HYSTERECTOMY     partial   LAPAROSCOPIC APPENDECTOMY N/A 12/05/2016   Procedure: APPENDECTOMY LAPAROSCOPIC;  Surgeon: Vickie Epley, MD;  Location: [facility] ORS;  Service: General;  Laterality: N/A;   THORACIC OUTLET SURGERY       Home Medications:  Prior to Admission medications   Medication Sig Start Date End Date Taking? Authorizing Provider  chlorpheniramine-HYDROcodone (TUSSIONEX) 10-8 MG/5ML SUER  SHAKE LQ AND TK 5 ML PO Q 12 H PRF COUGH 03/13/18  Yes [provider]  diazepam (VALIUM) 5 MG tablet Take 2.5 mg by mouth 2 (two) times daily as needed for anxiety.   Yes [provider]  albuterol (PROVENTIL HFA;VENTOLIN HFA) 108 (90 Base) MCG/ACT inhaler Inhale 2 puffs into the lungs every 6 (six) hours as needed for wheezing or shortness of breath.    [provider]  traMADol (ULTRAM) 50 MG tablet Take 1 tablet (50 mg total) by mouth every 6 (six) hours as needed for severe pain. Patient not taking: Reported on 05/09/2018 12/06/16   Vickie Epley, MD    Inpatient Medications: Scheduled Meds:  enoxaparin (LOVENOX) injection  40 mg Subcutaneous Q24H   feeding supplement  237 mL Oral BID BM   [START ON 03/08/2022] furosemide  40 mg Oral Daily   multivitamin with minerals  1 tablet Oral Daily   Continuous Infusions:  PRN Meds: acetaminophen **OR** acetaminophen, albuterol, diazepam, HYDROcodone-acetaminophen, ondansetron **OR** ondansetron (ZOFRAN) IV  Allergies:    Allergies  Allergen Reactions   Escitalopram Oxalate Palpitations    Loose stools   Amoxicillin-Pot Clavulanate     Other reaction(s): Vomiting   Aspirin Other (See Comments)    Stomach hurt   Codeine Other (See Comments)    stomach hurt   Doxycycline Swelling   Levofloxacin     Other reaction(s): Dizziness    Social History:   Social History  Socioeconomic History   Marital status: Married    Spouse name: Not on file   Number of children: Not on file   Years of education: Not on file   Highest education level: Not on file  Occupational History   Occupation: retired  Tobacco Use   Smoking status: Every Day    Packs/day: 0.75    Years: 51.00    Total pack years: 38.25    Types: Cigarettes   Smokeless tobacco: Never   Tobacco comments:    10 cigarettes perday  Vaping Use   Vaping Use: Never used  Substance and Sexual Activity   Alcohol use: No    Alcohol/week: 0.0  standard drinks of alcohol   Drug use: No   Sexual activity: Not on file  Other Topics Concern   Not on file  Social History Narrative   Not on file   Social Determinants of Health   Financial Resource Strain: Not on file  Food Insecurity: Not on file  Transportation Needs: Not on file  Physical Activity: Not on file  Stress: Not on file  Social Connections: Not on file  Intimate Partner Violence: Not on file    Family History:    Family History  Problem Relation Age of Onset   Vision loss Mother    Macular degeneration Mother    Dementia Mother    Kidney disease Father    COPD Sister    Uterine cancer Sister    Lung cancer Maternal Aunt    COPD Sister    Colon cancer Maternal Aunt    Ovarian cancer Maternal Aunt      ROS:  Please see the history of present illness.   All other ROS reviewed and negative.     Physical Exam/Data:   Vitals:   03/07/22 0056 03/07/22 0400 03/07/22 1300 03/07/22 1600  BP: 134/69 137/68 132/70 138/75  Pulse: 60 65 75 84  Resp: '18 18 20 '$ (!) 22  Temp: 98 F (36.7 C) 98 F (36.7 C) 98.2 F (36.8 C) 98.7 F (37.1 C)  TempSrc: Oral Oral Oral Oral  SpO2: 97% 95% 94% 95%  Weight:      Height:        Intake/Output Summary (Last 24 hours) at 03/07/2022 1654 Last data filed at 03/07/2022 1100 Gross per 24 hour  Intake --  Output 1600 ml  Net -1600 ml      03/06/2022    8:00 PM 05/09/2018   10:52 AM 04/17/2018    1:00 PM  Last 3 Weights  Weight (lbs) 126 lb 8.7 oz 127 lb 3.2 oz 126 lb  Weight (kg) 57.4 kg 57.698 kg 57.153 kg     Body mass index is 22.42 kg/m.  General:  Well nourished, well developed, in no acute distress HEENT: normal Neck: no JVD Cardiac:  normal S1, S2; RRR; no murmur  Lungs: Diminished breath sounds bilaterally, rhonchi noted Abd: soft, nontender, no hepatomegaly  Ext: no edema Musculoskeletal:  No deformities, BUE and BLE strength normal and equal Skin: warm and dry  Neuro:  CNs 2-12 intact, no  focal abnormalities noted Psych:  Normal affect   EKG:  The EKG was personally reviewed and demonstrates: Sinus rhythm Telemetry:  Telemetry was personally reviewed and demonstrates: Sinus rhythm  Relevant CV Studies:  Echocardiogram obtained yesterday reviewed, EF 55 to 81%, grade 1 diastolic dysfunction, moderate pulmonary hypertension RVSP 53 mmHg.  Mild right ventricular dilatation, low normal RV function.  Laboratory Data:  High Sensitivity  Troponin:   Recent Labs  Lab 03/06/22 1406 03/06/22 1827 03/06/22 2321  TROPONINIHS 22* 22* 23*     Chemistry Recent Labs  Lab 03/06/22 1409 03/06/22 2321  NA 141  --   K 4.6  --   CL 109  --   CO2 24  --   GLUCOSE 95  --   BUN 30*  --   CREATININE 1.09* 0.97  CALCIUM 9.4  --   GFRNONAA 54* >60  ANIONGAP 8  --     No results for input(s): "PROT", "ALBUMIN", "AST", "ALT", "ALKPHOS", "BILITOT" in the last 168 hours. Lipids No results for input(s): "CHOL", "TRIG", "HDL", "LABVLDL", "LDLCALC", "CHOLHDL" in the last 168 hours.  Hematology Recent Labs  Lab 03/06/22 1409 03/06/22 2321  WBC 7.1 8.2  RBC 5.58* 5.65*  HGB 17.2* 17.7*  HCT 53.4* 53.8*  MCV 95.7 95.2  MCH 30.8 31.3  MCHC 32.2 32.9  RDW 13.9 13.6  PLT 169 169   Thyroid No results for input(s): "TSH", "FREET4" in the last 168 hours.  BNP Recent Labs  Lab 03/06/22 1409  BNP 1,100.6*    DDimer No results for input(s): "DDIMER" in the last 168 hours.   Radiology/Studies:  ECHOCARDIOGRAM COMPLETE  Result Date: 03/07/2022    ECHOCARDIOGRAM REPORT   Patient Name:   Holly Werner Date of Exam: 03/06/2022 Medical Rec #:  [id number]    Height:       63.0 in Accession #:    [id number]   Weight:       126.5 lb Date of Birth:  04/18/1951     BSA:          1.592 m Patient Age:    15 years     BP:           125/70 mmHg Patient Gender: F            HR:           63 bpm. Exam Location:  [facility] Procedure: 2D Echo, Cardiac Doppler and Color Doppler Indications:     I33.82 Acute  Diastolic CHF  History:         Patient has no prior history of Echocardiogram examinations.                  Signs/Symptoms:Chest Pain. Lung Emphysema.  Sonographer:     Mattea Seger RDCS Referring Phys:  [id number] Athena Masse Diagnosing Phys: Yolonda Kida MD IMPRESSIONS  1. Left ventricular ejection fraction, by estimation, is 60 to 65%. The left ventricle has normal function. The left ventricle demonstrates regional wall motion abnormalities (see scoring diagram/findings for description). Left ventricular diastolic parameters are consistent with Grade I diastolic dysfunction (impaired relaxation).  2. Right ventricular systolic function is low normal. The right ventricular size is mildly enlarged. Mildly increased right ventricular wall thickness.  3. The mitral valve is normal in structure. Mild mitral valve regurgitation.  4. The aortic valve is normal in structure. Aortic valve regurgitation is not visualized. FINDINGS  Left Ventricle: Left ventricular ejection fraction, by estimation, is 60 to 65%. The left ventricle has normal function. The left ventricle demonstrates regional wall motion abnormalities. The left ventricular internal cavity size was normal in size. There is no left ventricular hypertrophy. Left ventricular diastolic parameters are consistent with Grade I diastolic dysfunction (impaired relaxation). Right Ventricle: The right ventricular size is mildly enlarged. Mildly increased right ventricular wall thickness. Right ventricular systolic function is low normal. Left Atrium: Left  atrial size was normal in size. Right Atrium: Right atrial size was normal in size. Pericardium: There is no evidence of pericardial effusion. Mitral Valve: The mitral valve is normal in structure. Mild mitral valve regurgitation. Tricuspid Valve: The tricuspid valve is normal in structure. Tricuspid valve regurgitation is trivial. Aortic Valve: The aortic valve is normal in structure. Aortic valve  regurgitation is not visualized. Pulmonic Valve: The pulmonic valve was normal in structure. Pulmonic valve regurgitation is not visualized. Aorta: The ascending aorta was not well visualized. IAS/Shunts: No atrial level shunt detected by color flow Doppler.  LEFT VENTRICLE PLAX 2D LVIDd:         3.50 cm   Diastology LVIDs:         2.30 cm   LV e' medial:    4.52 cm/s LV PW:         0.80 cm   LV E/e' medial:  13.5 LV IVS:        0.80 cm   LV e' lateral:   6.96 cm/s LVOT diam:     1.50 cm   LV E/e' lateral: 8.7 LV SV:         43 LV SV Index:   27 LVOT Area:     1.77 cm  RIGHT VENTRICLE             IVC RV Basal diam:  3.90 cm     IVC diam: 1.90 cm RV S prime:     11.50 cm/s TAPSE (M-mode): 2.2 cm LEFT ATRIUM             Index        RIGHT ATRIUM           Index LA diam:        3.70 cm 2.32 cm/m   RA Area:     15.00 cm LA Vol (A2C):   17.4 ml 10.93 ml/m  RA Volume:   44.80 ml  28.14 ml/m LA Vol (A4C):   23.0 ml 14.45 ml/m LA Biplane Vol: 20.9 ml 13.13 ml/m  AORTIC VALVE LVOT Vmax:   128.00 cm/s LVOT Vmean:  85.650 cm/s LVOT VTI:    0.246 m  AORTA Ao Root diam: 2.70 cm MITRAL VALVE               TRICUSPID VALVE MV Area (PHT): 3.42 cm    TR Peak grad:   65.0 mmHg MV Decel Time: 222 msec    TR Vmax:        403.00 cm/s MV E velocity: 60.80 cm/s MV A velocity: 81.20 cm/s  SHUNTS MV E/A ratio:  0.75        Systemic VTI:  0.25 m                            Systemic Diam: 1.50 cm Yolonda Kida MD Electronically signed by Yolonda Kida MD Signature Date/Time: 03/07/2022/4:24:13 PM    Final    CT Angio Chest Pulmonary Embolism (PE) W or WO Contrast  Result Date: 03/06/2022 CLINICAL DATA:  Hypoxia, right leg swelling EXAM: CT ANGIOGRAPHY CHEST WITH CONTRAST TECHNIQUE: Multidetector CT imaging of the chest was performed using the standard protocol during bolus administration of intravenous contrast. Multiplanar CT image reconstructions and MIPs were obtained to evaluate the vascular anatomy. RADIATION DOSE  REDUCTION: This exam was performed according to the departmental dose-optimization program which includes automated exposure control, adjustment of the mA and/or kV according  to patient size and/or use of iterative reconstruction technique. CONTRAST:  22m OMNIPAQUE IOHEXOL 350 MG/ML SOLN COMPARISON:  Chest radiograph dated 03/06/2022. CT chest dated 10/13/2019. FINDINGS: Cardiovascular: Satisfactory opacification the bilateral pulmonary arteries to the segmental level. Evaluation is mildly constrained by respiratory motion. Within that constraint, there is no evidence of pulmonary embolism. Apparent filling defect in the lobar right upper lobe pulmonary artery (series 6/image 173) reflects streak artifact from a dense SVC. Enlargement of the main pulmonary artery, suggesting pulmonary arterial hypertension. Study is not tailored for evaluation of the thoracic aorta. Within that constraint, there is no evidence of thoracic aortic aneurysm or dissection. Atherosclerotic calcifications of the arch. The heart is normal in size.  No pericardial effusion. Coronary atherosclerosis of the right coronary artery. Mediastinum/Nodes: No suspicious mediastinal lymphadenopathy. Visualized thyroid is notable for a 9 mm right thyroid nodule (series 4/image 18). Not clinically significant; no follow-up imaging recommended (ref: J Am Coll Radiol. 2015 Feb;12(2): 143-50). Lungs/Pleura: Severe centrilobular and paraseptal emphysematous changes, upper lung predominant. Right apical pleural-parenchymal scarring. 10 mm subpleural right lower lobe pulmonary nodule (series 5/image 105), unchanged from 2021, benign. No follow-up is recommended per Fleischner Society guidelines. No focal consolidation. No pleural effusion or pneumothorax. Upper Abdomen: Visualized upper abdomen is grossly unremarkable, noting vascular calcifications. Musculoskeletal: Visualized osseous structures are within normal limits. Review of the MIP images confirms  the above findings. IMPRESSION: No evidence of pulmonary embolism. Enlargement of the main pulmonary artery, suggesting pulmonary arterial hypertension. 10 mm subpleural right lower lobe pulmonary nodule, unchanged from 2021, benign. No follow-up is recommended per Fleischner Society guidelines. Aortic Atherosclerosis (ICD10-I70.0) and Emphysema (ICD10-J43.9). Electronically Signed   By: SJulian HyM.D.   On: 03/06/2022 17:33   DG Chest 2 View  Result Date: 03/06/2022 CLINICAL DATA:  low oxygen sats EXAM: CHEST - 2 VIEW COMPARISON:  Chest x-ray 12/04/2011. FINDINGS: Progressive emphysema with coarsening lung markings and blebs in the lung apices. No definite superimposed consolidation. No visible pleural effusions or pneumothorax. Cardiomediastinal silhouette is within normal limits. No acute osseous abnormality. IMPRESSION: Severe and progressive emphysema. No definite acute superimposed abnormality. Electronically Signed   By: FMargaretha SheffieldM.D.   On: 03/06/2022 14:30     Assessment and Plan:   Shortness of breath, leg edema -Echo reviewed by myself, showed normal LVEF, moderate pulmonary hypertension, grade 1 diastolic dysfunction. -Edema improved with IV Lasix -Start p.o. Lasix 40 mg daily tomorrow  2.  Moderate pulmonary hypertension -Etiology likely from COPD/emphysema, WHO class III. -Needs follow-up with pulmonary medicine and pulmonary hypertension clinic -Continue Lasix as above  3.  Current smoker, COPD, hypoxia -Not sure if she needs baseline oxygen due to hypoxia at home -Smoking cessation advised -Needs follow-up with pulmonary medicine as outpatient   No additional cardiac testing or intervention advised, patient has shortness of breath and hypoxia likely from severe COPD/emphysema.  Also current smoker.  Volume control with diuresis as above.  Will need to establish care and follow-up with pulmonary medicine and pulmonary hypertension clinic.  Please let uKoreaknow  if additional input is needed.  Total encounter time more than 85 minutes  Greater than 50% was spent in counseling and coordination of care with the patient    Signed, BKate Sable MD  03/07/2022 4:54 PM

## 2022-03-07 NOTE — Progress Notes (Signed)
Initial Nutrition Assessment  DOCUMENTATION CODES:   Not applicable  INTERVENTION:   -Liberalize diet to 2 gram sodium for wider variety of meal selections -MVI with minerals daily -Ensure Enlive po BID, each supplement provides 350 kcal and 20 grams of protein -Provided "Low Sodium Nutrition Therapy" handout from [facility] Nutrition Care Manual; attached to AVS/ discharge summary  NUTRITION DIAGNOSIS:   Increased nutrient needs related to chronic illness (CHF) as evidenced by estimated needs.  GOAL:   Patient will meet greater than or equal to 90% of their needs  MONITOR:   PO intake, Supplement acceptance  REASON FOR ASSESSMENT:   Rounds    ASSESSMENT:   Pt with medical history significant for COPD not on home oxygen, CKD stage IIIa, GERD, nicotine dependence and a known pulmonary nodule who presents with several day history of shortness of breath on exertion and leg swelling.  Pt admitted with new onset CHF.   Pt unavailable at time of visit. RD unable to obtain further nutrition-related history or complete nutrition-focused physical exam at this time.     Per Heart Failure Clinic NP, pt's husband passed away 9 days PTA.   Pt currently on a heart healthy diet. No meal completion data available to assess at this time.   Reviewed wt hx; wt has been stable over the past few years. Suspect edema may be masking true weight loss as well as fat and muscle depletions. Pt may benefit from addition of oral nutrition supplements.   Medications reviewed and include lasix.   Labs reviewed.   Diet Order:   Diet Order             Diet Heart Room service appropriate? Yes; Fluid consistency: Thin  Diet effective now                   EDUCATION NEEDS:   No education needs have been identified at this time  Skin:  Skin Assessment: Reviewed RN Assessment  Last BM:  Unknown  Height:   Ht Readings from Last 1 Encounters:  03/06/22 5' 2.99" (1.6 m)    Weight:   Wt  Readings from Last 1 Encounters:  03/06/22 57.4 kg    Ideal Body Weight:  52.3 kg  BMI:  Body mass index is 22.42 kg/m.  Estimated Nutritional Needs:   Kcal:  1550-1750  Protein:  75-90 grams  Fluid:  > 1.5 L    Loistine Chance, RD, LDN, [place] Registered Dietitian II Certified Diabetes Care and Education Specialist Please refer to [facility] for RD and/or RD on-call/weekend/after hours pager

## 2022-03-08 DIAGNOSIS — J9601 Acute respiratory failure with hypoxia: Secondary | ICD-10-CM | POA: Diagnosis not present

## 2022-03-08 LAB — FERRITIN: Ferritin: 28 ng/mL (ref 11–307)

## 2022-03-08 LAB — MAGNESIUM: Magnesium: 2.1 mg/dL (ref 1.7–2.4)

## 2022-03-08 LAB — C-REACTIVE PROTEIN: CRP: 1 mg/dL — ABNORMAL HIGH (ref <1.0)

## 2022-03-08 LAB — BASIC METABOLIC PANEL
Anion gap: 10 (ref 5–15)
BUN: 20 mg/dL (ref 8–23)
CO2: 31 mmol/L (ref 22–32)
Calcium: 9.1 mg/dL (ref 8.9–10.3)
Chloride: 99 mmol/L (ref 98–111)
Creatinine, Ser: 0.98 mg/dL (ref 0.44–1.00)
GFR, Estimated: >60 mL/min (ref >60)
Glucose, Bld: 100 mg/dL — ABNORMAL HIGH (ref 70–99)
Potassium: 3.2 mmol/L — ABNORMAL LOW (ref 3.5–5.1)
Sodium: 140 mmol/L (ref 135–145)

## 2022-03-08 LAB — SARS CORONAVIRUS 2 BY RT PCR: SARS Coronavirus 2 by RT PCR: NEGATIVE

## 2022-03-08 MED ORDER — POTASSIUM CHLORIDE CRYS ER 20 MEQ PO TBCR
40.0000 meq | EXTENDED_RELEASE_TABLET | Freq: Once | ORAL | Status: AC
  Administered 2022-03-08: 40 meq via ORAL
  Filled 2022-03-08: qty 2

## 2022-03-08 MED ORDER — POTASSIUM CHLORIDE CRYS ER 20 MEQ PO TBCR: 40.0000 meq | EXTENDED_RELEASE_TABLET | Freq: Once | ORAL | Status: DC

## 2022-03-08 MED ORDER — POTASSIUM CHLORIDE CRYS ER 20 MEQ PO TBCR
20.0000 meq | EXTENDED_RELEASE_TABLET | Freq: Once | ORAL | Status: AC
  Administered 2022-03-08: 20 meq via ORAL
  Filled 2022-03-08: qty 1

## 2022-03-08 MED ORDER — SILDENAFIL CITRATE 20 MG PO TABS
20.0000 mg | ORAL_TABLET | Freq: Three times a day (TID) | ORAL | Status: DC
  Administered 2022-03-08 – 2022-03-09 (×2): 20 mg via ORAL
  Filled 2022-03-08 (×4): qty 1

## 2022-03-08 MED ORDER — FUROSEMIDE 10 MG/ML IJ SOLN
20.0000 mg | Freq: Once | INTRAMUSCULAR | Status: AC
  Administered 2022-03-08: 20 mg via INTRAVENOUS
  Filled 2022-03-08: qty 2

## 2022-03-08 NOTE — Progress Notes (Signed)
Pt ambulated in hallway on room air. O2 sats were at 78%. Patient ambulated back to room and was put on 1.5 L in bed. O2 sats then rose to 94%.

## 2022-03-08 NOTE — Progress Notes (Signed)
  Transition of Care [facility]) Screening Note   Patient Details  Name: MEEKAH MATH Date of Birth: [birth year]   Transition of Care [facility]) CM/SW Contact:    Alberteen Sam, LCSW Phone Number: 03/08/2022, 9:38 AM  TOC consulted for heart failure screen. Heart failure RN Delmar Landau has been informed.   Transition of Care Department [facility]) has reviewed patient and no TOC needs have been identified at this time. We will continue to monitor patient advancement through interdisciplinary progression rounds. If new patient transition needs arise, please place a TOC consult.  [place], [place]

## 2022-03-08 NOTE — Progress Notes (Signed)
PROGRESS NOTE    MARIANNA Werner  GQQ:[id number] DOB: 1950/09/10 DOA: 03/06/2022 PCP: Holly Elk, MD    Brief Narrative:  Holly Werner is a 71 y.o. female with medical history significant for COPD not on home oxygen, CKD stage IIIa, GERD, nicotine dependence and a known pulmonary nodule who presents to the ED with several day history of shortness of breath on exertion and leg swelling.  She has been checking her oxygen saturation over the past few days and has been in the mid 80s.  Patient denies ever having to use inhalers.  States she was hospitalized about 12 years ago at [facility] with a lung problem and they could not arrive at a diagnosis.  She reports pain in her left thigh but swelling in both her legs as well as an 8 pound weight gain.  Denies orthopnea.  Denies cough, fever or chills recently lost her husband just over a week ago.  She otherwise denies feeling unwell.  Denies nausea, vomiting or diarrhea or abdominal pain ED course and data review: BP 138/104 with O2 sat 90% on 2 L.  Troponin 22 and BNP 1106.  Creatinine 1.09 which is her baseline WBC normal.  EKG, personally viewed and interpreted no acute ST-T wave changes. CTA chest negative for PE and showing the following:   Patient given dose of Lasix and hospitalist consulted for admission.   10/12 no overnight issues.  Desatted with ambulation to 78% improved with 1.5 to 2 L to 94%.  PCCM consulted  Consultants:  cardiology  Procedures:   Antimicrobials:      Subjective: Shortness of breath is improving , no cp  Objective: Vitals:   03/08/22 0406 03/08/22 0737 03/08/22 1139 03/08/22 1400  BP: 111/60 109/65 118/67 (!) 96/59  Pulse: 67 62 70 77  Resp: '18 16 17 14  '$ Temp: 97.7 F (36.5 C) 98 F (36.7 C) 97.8 F (36.6 C) 97.7 F (36.5 C)  TempSrc:      SpO2: 96% 94% 91% 90%  Weight:      Height:        Intake/Output Summary (Last 24 hours) at 03/08/2022 1509 Last data filed at 03/08/2022 1445 Gross per 24  hour  Intake 700 ml  Output 1950 ml  Net -1250 ml   Filed Weights   03/06/22 2000  Weight: 57.4 kg    Examination: Calm, NAD Minimal scattered crakles +jvd Reg s1/s2 no gallop Soft benign +bs No edema Aaoxox3  Mood and affect appropriate in current setting     Data Reviewed: I have personally reviewed following labs and imaging studies  CBC: Recent Labs  Lab 03/06/22 1409 03/06/22 2321  WBC 7.1 8.2  HGB 17.2* 17.7*  HCT 53.4* 53.8*  MCV 95.7 95.2  PLT 169 671   Basic Metabolic Panel: Recent Labs  Lab 03/06/22 1409 03/06/22 2321 03/08/22 0527  NA 141  --  140  K 4.6  --  3.2*  CL 109  --  99  CO2 24  --  31  GLUCOSE 95  --  100*  BUN 30*  --  20  CREATININE 1.09* 0.97 0.98  CALCIUM 9.4  --  9.1  MG  --   --  2.1   GFR: Estimated Creatinine Clearance: 42.3 mL/min (by C-G formula based on SCr of 0.98 mg/dL). Liver Function Tests: No results for input(s): "AST", "ALT", "ALKPHOS", "BILITOT", "PROT", "ALBUMIN" in the last 168 hours. No results for input(s): "LIPASE", "AMYLASE" in the last  168 hours. No results for input(s): "AMMONIA" in the last 168 hours. Coagulation Profile: No results for input(s): "INR", "PROTIME" in the last 168 hours. Cardiac Enzymes: No results for input(s): "CKTOTAL", "CKMB", "CKMBINDEX", "TROPONINI" in the last 168 hours. BNP (last 3 results) No results for input(s): "PROBNP" in the last 8760 hours. HbA1C: No results for input(s): "HGBA1C" in the last 72 hours. CBG: No results for input(s): "GLUCAP" in the last 168 hours. Lipid Profile: No results for input(s): "CHOL", "HDL", "LDLCALC", "TRIG", "CHOLHDL", "LDLDIRECT" in the last 72 hours. Thyroid Function Tests: No results for input(s): "TSH", "T4TOTAL", "FREET4", "T3FREE", "THYROIDAB" in the last 72 hours. Anemia Panel: No results for input(s): "VITAMINB12", "FOLATE", "FERRITIN", "TIBC", "IRON", "RETICCTPCT" in the last 72 hours. Sepsis Labs: No results for input(s):  "PROCALCITON", "LATICACIDVEN" in the last 168 hours.  No results found for this or any previous visit (from the past 240 hour(s)).       Radiology Studies: ECHOCARDIOGRAM COMPLETE  Result Date: 03/07/2022    ECHOCARDIOGRAM REPORT   Patient Name:   Holly Werner Date of Werner: 03/06/2022 Medical Rec #:  [id number]    Height:       63.0 in Accession #:    [id number]   Weight:       126.5 lb Date of Birth:  04/28/1951     BSA:          1.592 m Patient Age:    83 years     BP:           125/70 mmHg Patient Gender: F            HR:           63 bpm. Werner Location:  [facility] Procedure: 2D Echo, Cardiac Doppler and Color Doppler Indications:     D66.44 Acute Diastolic CHF  History:         Patient has no prior history of Echocardiogram examinations.                  Signs/Symptoms:Chest Pain. Lung Emphysema.  Sonographer:     Holly Werner RDCS Referring Phys:  [id number] Holly Werner Diagnosing Phys: Holly Kida MD IMPRESSIONS  1. Left ventricular ejection fraction, by estimation, is 60 to 65%. The left ventricle has normal function. The left ventricle demonstrates regional wall motion abnormalities (see scoring diagram/findings for description). Left ventricular diastolic parameters are consistent with Grade I diastolic dysfunction (impaired relaxation).  2. Right ventricular systolic function is low normal. The right ventricular size is mildly enlarged. Mildly increased right ventricular wall thickness.  3. The mitral valve is normal in structure. Mild mitral valve regurgitation.  4. The aortic valve is normal in structure. Aortic valve regurgitation is not visualized. FINDINGS  Left Ventricle: Left ventricular ejection fraction, by estimation, is 60 to 65%. The left ventricle has normal function. The left ventricle demonstrates regional wall motion abnormalities. The left ventricular internal cavity size was normal in size. There is no left ventricular hypertrophy. Left ventricular diastolic  parameters are consistent with Grade I diastolic dysfunction (impaired relaxation). Right Ventricle: The right ventricular size is mildly enlarged. Mildly increased right ventricular wall thickness. Right ventricular systolic function is low normal. Left Atrium: Left atrial size was normal in size. Right Atrium: Right atrial size was normal in size. Pericardium: There is no evidence of pericardial effusion. Mitral Valve: The mitral valve is normal in structure. Mild mitral valve regurgitation. Tricuspid Valve: The tricuspid valve is normal in structure. Tricuspid  valve regurgitation is trivial. Aortic Valve: The aortic valve is normal in structure. Aortic valve regurgitation is not visualized. Pulmonic Valve: The pulmonic valve was normal in structure. Pulmonic valve regurgitation is not visualized. Aorta: The ascending aorta was not well visualized. IAS/Shunts: No atrial level shunt detected by color flow Doppler.  LEFT VENTRICLE PLAX 2D LVIDd:         3.50 cm   Diastology LVIDs:         2.30 cm   LV e' medial:    4.52 cm/s LV PW:         0.80 cm   LV E/e' medial:  13.5 LV IVS:        0.80 cm   LV e' lateral:   6.96 cm/s LVOT diam:     1.50 cm   LV E/e' lateral: 8.7 LV SV:         43 LV SV Index:   27 LVOT Area:     1.77 cm  RIGHT VENTRICLE             IVC RV Basal diam:  3.90 cm     IVC diam: 1.90 cm RV S prime:     11.50 cm/s TAPSE (M-mode): 2.2 cm LEFT ATRIUM             Index        RIGHT ATRIUM           Index LA diam:        3.70 cm 2.32 cm/m   RA Area:     15.00 cm LA Vol (A2C):   17.4 ml 10.93 ml/m  RA Volume:   44.80 ml  28.14 ml/m LA Vol (A4C):   23.0 ml 14.45 ml/m LA Biplane Vol: 20.9 ml 13.13 ml/m  AORTIC VALVE LVOT Vmax:   128.00 cm/s LVOT Vmean:  85.650 cm/s LVOT VTI:    0.246 m  AORTA Ao Root diam: 2.70 cm MITRAL VALVE               TRICUSPID VALVE MV Area (PHT): 3.42 cm    TR Peak grad:   65.0 mmHg MV Decel Time: 222 msec    TR Vmax:        403.00 cm/s MV E velocity: 60.80 cm/s MV A  velocity: 81.20 cm/s  SHUNTS MV E/A ratio:  0.75        Systemic VTI:  0.25 m                            Systemic Diam: 1.50 cm Holly Kida MD Electronically signed by Holly Kida MD Signature Date/Time: 03/07/2022/4:24:13 PM    Final    CT Angio Chest Pulmonary Embolism (PE) W or WO Contrast  Result Date: 03/06/2022 CLINICAL DATA:  Hypoxia, right leg swelling Werner: CT ANGIOGRAPHY CHEST WITH CONTRAST TECHNIQUE: Multidetector CT imaging of the chest was performed using the standard protocol during bolus administration of intravenous contrast. Multiplanar CT image reconstructions and MIPs were obtained to evaluate the vascular anatomy. RADIATION DOSE REDUCTION: This Werner was performed according to the departmental dose-optimization program which includes automated exposure control, adjustment of the mA and/or kV according to patient size and/or use of iterative reconstruction technique. CONTRAST:  52m OMNIPAQUE IOHEXOL 350 MG/ML SOLN COMPARISON:  Chest radiograph dated 03/06/2022. CT chest dated 10/13/2019. FINDINGS: Cardiovascular: Satisfactory opacification the bilateral pulmonary arteries to the segmental level. Evaluation is mildly constrained by respiratory motion. Within  that constraint, there is no evidence of pulmonary embolism. Apparent filling defect in the lobar right upper lobe pulmonary artery (series 6/image 173) reflects streak artifact from a dense SVC. Enlargement of the main pulmonary artery, suggesting pulmonary arterial hypertension. Study is not tailored for evaluation of the thoracic aorta. Within that constraint, there is no evidence of thoracic aortic aneurysm or dissection. Atherosclerotic calcifications of the arch. The heart is normal in size.  No pericardial effusion. Coronary atherosclerosis of the right coronary artery. Mediastinum/Nodes: No suspicious mediastinal lymphadenopathy. Visualized thyroid is notable for a 9 mm right thyroid nodule (series 4/image 18). Not  clinically significant; no follow-up imaging recommended (ref: J Am Coll Radiol. 2015 Feb;12(2): 143-50). Lungs/Pleura: Severe centrilobular and paraseptal emphysematous changes, upper lung predominant. Right apical pleural-parenchymal scarring. 10 mm subpleural right lower lobe pulmonary nodule (series 5/image 105), unchanged from 2021, benign. No follow-up is recommended per Fleischner Society guidelines. No focal consolidation. No pleural effusion or pneumothorax. Upper Abdomen: Visualized upper abdomen is grossly unremarkable, noting vascular calcifications. Musculoskeletal: Visualized osseous structures are within normal limits. Review of the MIP images confirms the above findings. IMPRESSION: No evidence of pulmonary embolism. Enlargement of the main pulmonary artery, suggesting pulmonary arterial hypertension. 10 mm subpleural right lower lobe pulmonary nodule, unchanged from 2021, benign. No follow-up is recommended per Fleischner Society guidelines. Aortic Atherosclerosis (ICD10-I70.0) and Emphysema (ICD10-J43.9). Electronically Signed   By: Julian Hy M.D.   On: 03/06/2022 17:33        Scheduled Meds:  enoxaparin (LOVENOX) injection  40 mg Subcutaneous Q24H   feeding supplement  237 mL Oral BID BM   furosemide  20 mg Intravenous Once   furosemide  40 mg Oral Daily   multivitamin with minerals  1 tablet Oral Daily   potassium chloride  40 mEq Oral Once   sildenafil  20 mg Oral TID   Continuous Infusions:  Assessment & Plan:   Principal Problem:   Acute respiratory failure with hypoxia (HCC) Active Problems:   Acute heart failure (HCC)   COPD (chronic obstructive pulmonary disease) (HCC)   GERD (gastroesophageal reflux disease)   Chronic kidney disease, stage 3a (HCC)   Tobacco abuse  Acute respiratory failure with hypoxia ([place]) Patient presents with dyspnea on exertion, O2 sat at home in the mid 80s, 88% on arrival improving to the low to mid 90s on 2  Chest x-ray showed  severe and progressive emphysema CTA negative for PE but shows pulmonary artery hypertension Suspect multifactorial, possibly pulmonary artery hypertension, cor pulmonale, new onset CHF 10/12 will need home DME 02 Likely copd contributing too. PCCM consulted        Acute CHF (congestive heart failure) [facility]) Cardiology was following Echo normal EF Still volume overloaded we will give small dose of Lasix 20 mg IV x1 tonight.  Did receive her p.o. 40 this AM. I's and O's and daily weights   Chronic kidney disease, stage 3a (HCC) Stable and at baseline   GERD (gastroesophageal reflux disease) Continue PPI   COPD (chronic obstructive pulmonary disease) ([place]) Not acutely exacerbated.  No wheezing or rhonchi on Werner DuoNebs as needed PCCM consulted   Tobacco abuse Discussed with patient about tobacco abuse and counseled her to stop      DVT prophylaxis: Lovenox Code Status: Full Family Communication: none Disposition Plan:  Status is: Inpatient Remains inpatient appropriate because: IV treatment      LOS: 2 days   Time spent: 35 minutes    Nolberto Hanlon, MD  [facility] Pager [phone]  If 7PM-7AM, please contact night-coverage 03/08/2022, 3:09 PM

## 2022-03-08 NOTE — Plan of Care (Signed)

## 2022-03-08 NOTE — Consult Note (Signed)
PULMONOLOGY         Date: 03/08/2022,   MRN# [id number] Holly Werner [birth year]     AdmissionWeight: 57.4 kg                 CurrentWeight: 57.4 kg  Referring provider: Dr. Kurtis Bushman   CHIEF COMPLAINT:   Acute hypoxemic respiratory failure   HISTORY OF PRESENT ILLNESS   This is a pleasant 71 year old female with a history of centrilobular emphysema, GERD, pulmonary hypertension, COPD, right lung nodule, history of acute appendicitis 5 years ago irritable bowel syndrome, CKD stage III, chronic neck pain, lower extremity swelling, premature menopause, tobacco abuse history, she came in due to noted hypoxemia at home with SPO2 in the 80s consistently on multiple measurements.  She has not been on inhaler therapy in the past.  She reports worsening lower extremity edema and weight gain of several pounds in the last few weeks.  She denies flulike illness sick contacts and is COVID-negative on admission.  She was noted to have elevation of BNP over 1100 on admission she had a CT PE performed there was a 10 mm subpleural right lower lobe nodule which was unchanged from 2021 and enlargement of the main pulmonary artery suggestive of pulmonary hypertension as well as findings consistent with RV failure.   PAST MEDICAL HISTORY   Past Medical History:  Diagnosis Date   Chronic pain    Emphysema lung (HCC)    GERD (gastroesophageal reflux disease)    Thoracic outlet syndrome      SURGICAL HISTORY   Past Surgical History:  Procedure Laterality Date   ABDOMINAL HYSTERECTOMY     partial   LAPAROSCOPIC APPENDECTOMY N/A 12/05/2016   Procedure: APPENDECTOMY LAPAROSCOPIC;  Surgeon: Vickie Epley, MD;  Location: [facility] ORS;  Service: General;  Laterality: N/A;   THORACIC OUTLET SURGERY       FAMILY HISTORY   Family History  Problem Relation Age of Onset   Vision loss Mother    Macular degeneration Mother    Dementia Mother    Kidney disease Father    COPD Sister    Uterine  cancer Sister    Lung cancer Maternal Aunt    COPD Sister    Colon cancer Maternal Aunt    Ovarian cancer Maternal Aunt      SOCIAL HISTORY   Social History   Tobacco Use   Smoking status: Every Day    Packs/day: 0.75    Years: 51.00    Total pack years: 38.25    Types: Cigarettes   Smokeless tobacco: Never   Tobacco comments:    10 cigarettes perday  Vaping Use   Vaping Use: Never used  Substance Use Topics   Alcohol use: No    Alcohol/week: 0.0 standard drinks of alcohol   Drug use: No     MEDICATIONS    Home Medication:    Current Medication:  Current Facility-Administered Medications:    acetaminophen (TYLENOL) tablet 650 mg, 650 mg, Oral, Q6H PRN, 650 mg at 03/07/22 2259 **OR** acetaminophen (TYLENOL) suppository 650 mg, 650 mg, Rectal, Q6H PRN, Judd Gaudier V, MD   albuterol (PROVENTIL) (2.5 MG/3ML) 0.083% nebulizer solution 3 mL, 3 mL, Inhalation, Q6H PRN, Athena Masse, MD   diazepam (VALIUM) tablet 2.5 mg, 2.5 mg, Oral, BID PRN, Judd Gaudier V, MD, 2.5 mg at 03/07/22 2256   enoxaparin (LOVENOX) injection 40 mg, 40 mg, Subcutaneous, Q24H, Athena Masse, MD, 40 mg at 03/07/22 2213  feeding supplement (ENSURE ENLIVE / ENSURE PLUS) liquid 237 mL, 237 mL, Oral, BID BM, Kurtis Bushman, Sahar, MD, 237 mL at 03/07/22 1450   furosemide (LASIX) injection 20 mg, 20 mg, Intravenous, Once, Nolberto Hanlon, MD   furosemide (LASIX) tablet 40 mg, 40 mg, Oral, Daily, Agbor-Etang, Aaron Edelman, MD, 40 mg at 03/08/22 4008   HYDROcodone-acetaminophen (NORCO/VICODIN) 5-325 MG per tablet 1-2 tablet, 1-2 tablet, Oral, Q4H PRN, Athena Masse, MD   multivitamin with minerals tablet 1 tablet, 1 tablet, Oral, Daily, Nolberto Hanlon, MD, 1 tablet at 03/08/22 0928   ondansetron (ZOFRAN) tablet 4 mg, 4 mg, Oral, Q6H PRN **OR** ondansetron (ZOFRAN) injection 4 mg, 4 mg, Intravenous, Q6H PRN, Athena Masse, MD   potassium chloride SA (KLOR-CON M) CR tablet 40 mEq, 40 mEq, Oral, Once, Nolberto Hanlon,  MD    ALLERGIES   Escitalopram oxalate, Amoxicillin-pot clavulanate, Aspirin, Codeine, Doxycycline, and Levofloxacin     REVIEW OF SYSTEMS    Review of Systems:  Gen:  Denies  fever, sweats, chills weigh loss  HEENT: Denies blurred vision, double vision, ear pain, eye pain, hearing loss, nose bleeds, sore throat Cardiac:  No dizziness, chest pain or heaviness, chest tightness,edema Resp:   reports dyspnea chronically  Gi: Denies swallowing difficulty, stomach pain, nausea or vomiting, diarrhea, constipation, bowel incontinence Gu:  Denies bladder incontinence, burning urine Ext:   Denies Joint pain, stiffness or swelling Skin: Denies  skin rash, easy bruising or bleeding or hives Endoc:  Denies polyuria, polydipsia , polyphagia or weight change Psych:   Denies depression, insomnia or hallucinations   Other:  All other systems negative   VS: BP 118/67 (BP Location: Right Arm)   Pulse 70   Temp 97.8 F (36.6 C)   Resp 17   Ht 5' 2.99" (1.6 m)   Wt 57.4 kg   SpO2 91%   BMI 22.42 kg/m      PHYSICAL EXAM    GENERAL:NAD, no fevers, chills, no weakness no fatigue HEAD: Normocephalic, atraumatic.  EYES: Pupils equal, round, reactive to light. Extraocular muscles intact. No scleral icterus.  MOUTH: Moist mucosal membrane. Dentition intact. No abscess noted.  EAR, NOSE, THROAT: Clear without exudates. No external lesions.  NECK: Supple. No thyromegaly. No nodules. No JVD.  PULMONARY: decreased breath sounds with mild rhonchi worse at bases bilaterally.  CARDIOVASCULAR: S1 and S2. Regular rate and rhythm. No murmurs, rubs, or gallops. No edema. Pedal pulses 2+ bilaterally.  GASTROINTESTINAL: Soft, nontender, nondistended. No masses. Positive bowel sounds. No hepatosplenomegaly.  MUSCULOSKELETAL: No swelling, clubbing, or edema. Range of motion full in all extremities.  NEUROLOGIC: Cranial nerves II through XII are intact. No gross focal neurological deficits. Sensation  intact. Reflexes intact.  SKIN: No ulceration, lesions, rashes, or cyanosis. Skin warm and dry. Turgor intact.  PSYCHIATRIC: Mood, affect within normal limits. The patient is awake, alert and oriented x 3. Insight, judgment intact.       IMAGING     ASSESSMENT/PLAN   Acute on chronic hypoxemic respiratory failure -Patient has multiple comorbid conditions that are contributing to dyspnea, breathlessness and hypoxemia -She has pulmonary hypertension, diastolic CHF with RV failure, she has centrilobular and paraseptal emphysema with combined pulmonary fibrosis and emphysema and COPD.  She has CKD with fluid shifts all contributing to dyspnea.  On top of all this she is chronically physically deconditioned. -There is unclear reason for current exacerbation of CHF and she may have overlying viral LRTI.  I will send viral panel for RVP  today. She is COVID negative    Combined pulmonary fibrosis and emphysema (CPFE)    - continue dulera and incruse while in house    - patient will need alpha 1 testing on outpatient.  Will perform PFT and consider evaluation for lung transplant.     Pulmonary hypertension    Likely Group 2 or 3 due to cardiac/pulmonary disease   - will start sildenafil for now    -she is not on bidil or nitro    Thank you for allowing me to participate in the care of this patient.   Patient/Family are satisfied with care plan and all questions have been answered.    Provider disclosure: Patient with at least one acute or chronic illness or injury that poses a threat to life or bodily function and is being managed actively during this encounter.  All of the below services have been performed independently by signing provider:  review of prior documentation from internal and or external health records.  Review of previous and current lab results.  Interview and comprehensive assessment during patient visit today. Review of current and previous chest radiographs/CT scans.  Discussion of management and test interpretation with health care team and patient/family.   This document was prepared using Dragon voice recognition software and may include unintentional dictation errors.     Ottie Glazier, M.D.  Division of Pulmonary & Critical Care Medicine

## 2022-03-08 NOTE — Consult Note (Addendum)
   Heart Failure Nurse Navigator Note  HFpEF 60 to 65%.  Left ventricular diastolic parameters grade 1 diastolic dysfunction.  Right ventricular systolic function is low normal.  She presented to the emergency room complaining of shortness of breath, leg swelling, had noted an 8 pound weight gain, dyspnea on exertion and O2 saturations noted to be in the 80s.  BNP 1100.  Comorbidities:  COPD Chronic kidney disease stage III GERD Tobacco abuse  Medications:  Furosemide 40 mg daily Potassium chloride 40 mEq  Labs:  Sodium 141, potassium 4.6, chloride 109, CO2 24, BUN 30, creatinine 1.09, GFR 54, hemoglobin 17.7, hematocrit 53.8. Weight is not documented Blood pressure 118/67 Intake not documented Output 2500 mL  Initial meeting with send he was lying quietly in bed remains on O2 per nasal cannula but in no acute distress.  She states that she lives at home with her son, she also has a daughter who is very involved in her cares.  Husband had recently died.  States at home that she was not on oxygen.  Discussed diet.  She states that she hates to cook and mostly eats out.  Using the living with heart failure teaching booklet, went over making wise choices when eating in a restaurant.  Discussed sticking with less than 2000 mg in a 24-hour period..  So discussed fluid restriction of no more than 64 ounces, also went over what constitutes a liquid.  She did not feel that she drank that much throughout the day but realizes the importance for keeping track.  She states that she has a very good scale and discussed daily weights, recording and what to report.  Along with changes in weights to also report changes in symptoms such as increasing swelling, shortness of breath, PND and orthopnea.  Discussed follow-up in the outpatient heart failure clinic at [facility].  An appointment on October 19 at 2:30 in the afternoon.  States that her daughter also knows someone in the advanced heart failure  clinic in [place] and she was wanting to get her an appointment there with Dr. Haroldine Laws.  I recommended that she keep her appointment with Otila Kluver from there we could refer her easily to the outpatient heart failure clinic with Dr. Haroldine Laws.  She voices understanding.  She was given the living with heart failure teaching booklet, zone magnet, info on heart failure and low-sodium along with weight chart.  She had no further questions.  Continue to follow along.  Pricilla Riffle RN [facility]

## 2022-03-09 ENCOUNTER — Encounter: Payer: Self-pay | Admitting: Internal Medicine

## 2022-03-09 DIAGNOSIS — J9601 Acute respiratory failure with hypoxia: Secondary | ICD-10-CM | POA: Diagnosis not present

## 2022-03-09 LAB — RESPIRATORY PANEL BY PCR

## 2022-03-09 LAB — BASIC METABOLIC PANEL
Anion gap: 8 (ref 5–15)
BUN: 21 mg/dL (ref 8–23)
CO2: 30 mmol/L (ref 22–32)
Calcium: 9.2 mg/dL (ref 8.9–10.3)
Chloride: 101 mmol/L (ref 98–111)
Creatinine, Ser: 0.94 mg/dL (ref 0.44–1.00)
GFR, Estimated: >60 mL/min (ref >60)
Glucose, Bld: 90 mg/dL (ref 70–99)
Potassium: 3.8 mmol/L (ref 3.5–5.1)
Sodium: 139 mmol/L (ref 135–145)

## 2022-03-09 MED ORDER — FUROSEMIDE 40 MG PO TABS: 40.0000 mg | ORAL_TABLET | Freq: Every day | ORAL | 0 refills | Status: DC

## 2022-03-09 MED ORDER — POTASSIUM CHLORIDE CRYS ER 20 MEQ PO TBCR: 20.0000 meq | EXTENDED_RELEASE_TABLET | Freq: Every day | ORAL | 0 refills | Status: DC

## 2022-03-09 MED ORDER — SILDENAFIL CITRATE 20 MG PO TABS: 20.0000 mg | ORAL_TABLET | Freq: Three times a day (TID) | ORAL | 0 refills | Status: DC

## 2022-03-09 MED ORDER — POTASSIUM CHLORIDE CRYS ER 20 MEQ PO TBCR
20.0000 meq | EXTENDED_RELEASE_TABLET | Freq: Every day | ORAL | Status: DC
  Administered 2022-03-09: 20 meq via ORAL
  Filled 2022-03-09: qty 1

## 2022-03-09 NOTE — Discharge Summary (Signed)
Holly Werner JFH:[id number] DOB: 16-Jul-1950 DOA: 03/06/2022  PCP: Marinda Elk, MD  Admit date: 03/06/2022 Discharge date: 03/09/2022  Admitted From: home Disposition:  home  Recommendations for Outpatient Follow-up:  Follow up with PCP in 1 week Please obtain BMP/CBC in one week Please follow up with Dr. Kathaleen Bury in 1 week     Discharge Condition:Stable CODE STATUS: Full Diet recommendation: Heart Healthy  Brief/Interim Summary: Per HPI:o [name] is a 71 y.o. female with medical history significant for COPD not on home oxygen, CKD stage IIIa, GERD, nicotine dependence and a known pulmonary nodule who presents to the ED with several day history of shortness of breath on exertion and leg swelling.  She has been checking her oxygen saturation over the past few days and has been in the mid 80s.  Patient denies ever having to use inhalers.  States she was hospitalized about 12 years ago at [facility] with a lung problem and they could not arrive at a diagnosis.  She reports pain in her left thigh but swelling in both her legs as well as an 8 pound weight gain. ED course and data review: BP 138/104 with O2 sat 90% on 2 L.  Troponin 22 and BNP 1106.  Creatinine 1.09 which is her baseline WBC normal.  CTA chest negative for PE.  Please see full report below.  Acute respiratory failure with hypoxia ([place]) Patient presents with dyspnea on exertion, O2 sat at home in the mid 80s, 88% on arrival improving to the low to mid 90s on 2  Chest x-ray showed severe and progressive emphysema CTA negative for PE but shows pulmonary artery hypertension Suspect multifactorial, possibly pulmonary artery hypertension, cor pulmonale, new onset CHF Patient was treated for CHF.  Cardiology was consulted. She did required home O2 at 2 L on discharge Pulmonology was called salted due to her CT and chest x-ray findings.  Was started on sildenafil         Acute diastolic HF (congestive heart failure)  [facility]) Cardiology was following Echo normal EF Received IV Lasix with transition to p.o.   Chronic kidney disease, stage 3a (HCC) Stable and at baseline   GERD (gastroesophageal reflux disease) Continue PPI   COPD (chronic obstructive pulmonary disease) (HCC) No acute exacerbation Going home with home 02 2L     Tobacco abuse Discussed with patient about tobacco abuse and counseled her to stop   Discharge Diagnoses:  Principal Problem:   Acute respiratory failure with hypoxia (HCC) Active Problems:   Acute heart failure (HCC)   COPD (chronic obstructive pulmonary disease) (HCC)   GERD (gastroesophageal reflux disease)   Chronic kidney disease, stage 3a (HCC)   Tobacco abuse    Discharge Instructions  Discharge Instructions     Call MD for:  difficulty breathing, headache or visual disturbances   Complete by: As directed    Diet - low sodium heart healthy   Complete by: As directed    Discharge instructions   Complete by: As directed    If you get dizzy and blood pressure low, call heart failure clinic. Take the potassium when taking the diuretic No smoking or be around smoke or fire since using 0xygen   Increase activity slowly   Complete by: As directed       Allergies as of 03/09/2022       Reactions   Escitalopram Oxalate Palpitations   Loose stools   Amoxicillin-pot Clavulanate    Other reaction(s): Vomiting  Aspirin Other (See Comments)   Stomach hurt   Codeine Other (See Comments)   stomach hurt   Doxycycline Swelling   Levofloxacin    Other reaction(s): Dizziness        Medication List     STOP taking these medications    chlorpheniramine-HYDROcodone 10-8 MG/5ML Suer Commonly known as: TUSSIONEX   traMADol 50 MG tablet Commonly known as: ULTRAM       TAKE these medications    albuterol 108 (90 Base) MCG/ACT inhaler Commonly known as: VENTOLIN HFA Inhale 2 puffs into the lungs every 6 (six) hours as needed for wheezing or  shortness of breath.   diazepam 5 MG tablet Commonly known as: VALIUM Take 2.5 mg by mouth 2 (two) times daily as needed for anxiety.   furosemide 40 MG tablet Commonly known as: LASIX Take 1 tablet (40 mg total) by mouth daily. Start taking on: March 10, 2022   potassium chloride SA 20 MEQ tablet Commonly known as: KLOR-CON M Take 1 tablet (20 mEq total) by mouth daily. Start taking on: March 10, 2022   sildenafil 20 MG tablet Commonly known as: REVATIO Take 1 tablet (20 mg total) by mouth 3 (three) times daily.               Durable Medical Equipment  (From admission, onward)           Start     Ordered   03/08/22 1440  For home use only DME oxygen  Once       Question Answer Comment  Length of Need Lifetime   Mode or (Route) Nasal cannula   Liters per Minute 2   Frequency Continuous (stationary and portable oxygen unit needed)   Oxygen conserving device Yes   Oxygen delivery system Gas      03/08/22 1439            Follow-up Information     Kate Sable, MD Follow up in 1 week(s).   Specialties: Cardiology, Radiology Contact information: [place] [address] [phone]         Ottie Glazier, MD Follow up in 1 week(s).   Specialty: Pulmonary Disease Contact information: [place] [address] [phone]         Marinda Elk, MD Follow up in 1 week(s).   Specialty: Physician Assistant Contact information: [place] [address] [phone]                Allergies  Allergen Reactions   Escitalopram Oxalate Palpitations    Loose stools   Amoxicillin-Pot Clavulanate     Other reaction(s): Vomiting   Aspirin Other (See Comments)    Stomach hurt   Codeine Other (See Comments)    stomach hurt   Doxycycline Swelling   Levofloxacin     Other reaction(s): Dizziness    Consultations: Cardiology,  PCCM   Procedures/Studies: ECHOCARDIOGRAM COMPLETE  Result Date: 03/07/2022    ECHOCARDIOGRAM REPORT   Patient Name:   Holly Werner Date of Exam: 03/06/2022 Medical Rec #:  [id number]    Height:       63.0 in Accession #:    [id number]   Weight:       126.5 lb Date of Birth:  01/18/1951     BSA:          1.592 m Patient Age:    1 years     BP:  125/70 mmHg Patient Gender: F            HR:           63 bpm. Exam Location:  [facility] Procedure: 2D Echo, Cardiac Doppler and Color Doppler Indications:     W58.09 Acute Diastolic CHF  History:         Patient has no prior history of Echocardiogram examinations.                  Signs/Symptoms:Chest Pain. Lung Emphysema.  Sonographer:     Leyli Kevorkian RDCS Referring Phys:  [id number] Athena Masse Diagnosing Phys: Yolonda Kida MD IMPRESSIONS  1. Left ventricular ejection fraction, by estimation, is 60 to 65%. The left ventricle has normal function. The left ventricle demonstrates regional wall motion abnormalities (see scoring diagram/findings for description). Left ventricular diastolic parameters are consistent with Grade I diastolic dysfunction (impaired relaxation).  2. Right ventricular systolic function is low normal. The right ventricular size is mildly enlarged. Mildly increased right ventricular wall thickness.  3. The mitral valve is normal in structure. Mild mitral valve regurgitation.  4. The aortic valve is normal in structure. Aortic valve regurgitation is not visualized. FINDINGS  Left Ventricle: Left ventricular ejection fraction, by estimation, is 60 to 65%. The left ventricle has normal function. The left ventricle demonstrates regional wall motion abnormalities. The left ventricular internal cavity size was normal in size. There is no left ventricular hypertrophy. Left ventricular diastolic parameters are consistent with Grade I diastolic dysfunction (impaired relaxation). Right Ventricle: The right ventricular size is mildly  enlarged. Mildly increased right ventricular wall thickness. Right ventricular systolic function is low normal. Left Atrium: Left atrial size was normal in size. Right Atrium: Right atrial size was normal in size. Pericardium: There is no evidence of pericardial effusion. Mitral Valve: The mitral valve is normal in structure. Mild mitral valve regurgitation. Tricuspid Valve: The tricuspid valve is normal in structure. Tricuspid valve regurgitation is trivial. Aortic Valve: The aortic valve is normal in structure. Aortic valve regurgitation is not visualized. Pulmonic Valve: The pulmonic valve was normal in structure. Pulmonic valve regurgitation is not visualized. Aorta: The ascending aorta was not well visualized. IAS/Shunts: No atrial level shunt detected by color flow Doppler.  LEFT VENTRICLE PLAX 2D LVIDd:         3.50 cm   Diastology LVIDs:         2.30 cm   LV e' medial:    4.52 cm/s LV PW:         0.80 cm   LV E/e' medial:  13.5 LV IVS:        0.80 cm   LV e' lateral:   6.96 cm/s LVOT diam:     1.50 cm   LV E/e' lateral: 8.7 LV SV:         43 LV SV Index:   27 LVOT Area:     1.77 cm  RIGHT VENTRICLE             IVC RV Basal diam:  3.90 cm     IVC diam: 1.90 cm RV S prime:     11.50 cm/s TAPSE (M-mode): 2.2 cm LEFT ATRIUM             Index        RIGHT ATRIUM           Index LA diam:        3.70 cm 2.32 cm/m   RA Area:  15.00 cm LA Vol (A2C):   17.4 ml 10.93 ml/m  RA Volume:   44.80 ml  28.14 ml/m LA Vol (A4C):   23.0 ml 14.45 ml/m LA Biplane Vol: 20.9 ml 13.13 ml/m  AORTIC VALVE LVOT Vmax:   128.00 cm/s LVOT Vmean:  85.650 cm/s LVOT VTI:    0.246 m  AORTA Ao Root diam: 2.70 cm MITRAL VALVE               TRICUSPID VALVE MV Area (PHT): 3.42 cm    TR Peak grad:   65.0 mmHg MV Decel Time: 222 msec    TR Vmax:        403.00 cm/s MV E velocity: 60.80 cm/s MV A velocity: 81.20 cm/s  SHUNTS MV E/A ratio:  0.75        Systemic VTI:  0.25 m                            Systemic Diam: 1.50 cm Yolonda Kida  MD Electronically signed by Yolonda Kida MD Signature Date/Time: 03/07/2022/4:24:13 PM    Final    CT Angio Chest Pulmonary Embolism (PE) W or WO Contrast  Result Date: 03/06/2022 CLINICAL DATA:  Hypoxia, right leg swelling EXAM: CT ANGIOGRAPHY CHEST WITH CONTRAST TECHNIQUE: Multidetector CT imaging of the chest was performed using the standard protocol during bolus administration of intravenous contrast. Multiplanar CT image reconstructions and MIPs were obtained to evaluate the vascular anatomy. RADIATION DOSE REDUCTION: This exam was performed according to the departmental dose-optimization program which includes automated exposure control, adjustment of the mA and/or kV according to patient size and/or use of iterative reconstruction technique. CONTRAST:  75m OMNIPAQUE IOHEXOL 350 MG/ML SOLN COMPARISON:  Chest radiograph dated 03/06/2022. CT chest dated 10/13/2019. FINDINGS: Cardiovascular: Satisfactory opacification the bilateral pulmonary arteries to the segmental level. Evaluation is mildly constrained by respiratory motion. Within that constraint, there is no evidence of pulmonary embolism. Apparent filling defect in the lobar right upper lobe pulmonary artery (series 6/image 173) reflects streak artifact from a dense SVC. Enlargement of the main pulmonary artery, suggesting pulmonary arterial hypertension. Study is not tailored for evaluation of the thoracic aorta. Within that constraint, there is no evidence of thoracic aortic aneurysm or dissection. Atherosclerotic calcifications of the arch. The heart is normal in size.  No pericardial effusion. Coronary atherosclerosis of the right coronary artery. Mediastinum/Nodes: No suspicious mediastinal lymphadenopathy. Visualized thyroid is notable for a 9 mm right thyroid nodule (series 4/image 18). Not clinically significant; no follow-up imaging recommended (ref: J Am Coll Radiol. 2015 Feb;12(2): 143-50). Lungs/Pleura: Severe centrilobular and  paraseptal emphysematous changes, upper lung predominant. Right apical pleural-parenchymal scarring. 10 mm subpleural right lower lobe pulmonary nodule (series 5/image 105), unchanged from 2021, benign. No follow-up is recommended per Fleischner Society guidelines. No focal consolidation. No pleural effusion or pneumothorax. Upper Abdomen: Visualized upper abdomen is grossly unremarkable, noting vascular calcifications. Musculoskeletal: Visualized osseous structures are within normal limits. Review of the MIP images confirms the above findings. IMPRESSION: No evidence of pulmonary embolism. Enlargement of the main pulmonary artery, suggesting pulmonary arterial hypertension. 10 mm subpleural right lower lobe pulmonary nodule, unchanged from 2021, benign. No follow-up is recommended per Fleischner Society guidelines. Aortic Atherosclerosis (ICD10-I70.0) and Emphysema (ICD10-J43.9). Electronically Signed   By: SJulian HyM.D.   On: 03/06/2022 17:33   DG Chest 2 View  Result Date: 03/06/2022 CLINICAL DATA:  low oxygen sats EXAM: CHEST - 2  VIEW COMPARISON:  Chest x-ray 12/04/2011. FINDINGS: Progressive emphysema with coarsening lung markings and blebs in the lung apices. No definite superimposed consolidation. No visible pleural effusions or pneumothorax. Cardiomediastinal silhouette is within normal limits. No acute osseous abnormality. IMPRESSION: Severe and progressive emphysema. No definite acute superimposed abnormality. Electronically Signed   By: Margaretha Sheffield M.D.   On: 03/06/2022 14:30      Subjective: Feels better.  No new complaints.  Excited to go home  Discharge Exam: Vitals:   03/09/22 0615 03/09/22 0755  BP: 108/60 (!) 103/58  Pulse: 74 72  Resp: 18 18  Temp:  97.7 F (36.5 C)  SpO2: 91% 93%   Vitals:   03/09/22 0034 03/09/22 0500 03/09/22 0615 03/09/22 0755  BP: 105/63  108/60 (!) 103/58  Pulse: 83  74 72  Resp: '16  18 18  '$ Temp:    97.7 F (36.5 C)  TempSrc:     Oral  SpO2: 92%  91% 93%  Weight:  50.8 kg    Height:        General: Pt is alert, awake, not in acute distress Cardiovascular: RRR, S1/S2 +, no rubs, no gallops Respiratory: CTA bilaterally, no wheezing, no rhonchi Abdominal: Soft, NT, ND, bowel sounds + Extremities: no edema, no cyanosis    The results of significant diagnostics from this hospitalization (including imaging, microbiology, ancillary and laboratory) are listed below for reference.     Microbiology: Recent Results (from the past 240 hour(s))  SARS Coronavirus 2 by RT PCR (hospital order, performed in [facility] hospital lab) *cepheid single result test* Anterior Nasal Swab     Status: None   Collection Time: 03/08/22  9:21 PM   Specimen: Anterior Nasal Swab  Result Value Ref Range Status   SARS Coronavirus 2 by RT PCR NEGATIVE NEGATIVE Final    Comment: (NOTE) SARS-CoV-2 target nucleic acids are NOT DETECTED.  The SARS-CoV-2 RNA is generally detectable in upper and lower respiratory specimens during the acute phase of infection. The lowest concentration of SARS-CoV-2 viral copies this assay can detect is 250 copies / mL. A negative result does not preclude SARS-CoV-2 infection and should not be used as the sole basis for treatment or other patient management decisions.  A negative result may occur with improper specimen collection / handling, submission of specimen other than nasopharyngeal swab, presence of viral mutation(s) within the areas targeted by this assay, and inadequate number of viral copies (<250 copies / mL). A negative result must be combined with clinical observations, patient history, and epidemiological information.  Fact Sheet for Patients:   https://www.patel.info/  Fact Sheet for Healthcare Providers: https://hall.com/  This test is not yet approved or  cleared by the [place] FDA and has been authorized for detection and/or diagnosis of  SARS-CoV-2 by FDA under an Emergency Use Authorization (EUA).  This EUA will remain in effect (meaning this test can be used) for the duration of the COVID-19 declaration under Section 564(b)(1) of the Act, 21 U.S.C. section 360bbb-3(b)(1), unless the authorization is terminated or revoked sooner.  Performed at [facility], [place], [place] [address]   Respiratory (~20 pathogens) panel by PCR     Status: None   Collection Time: 03/08/22  9:21 PM   Specimen: Nasopharyngeal Swab; Respiratory  Result Value Ref Range Status   Adenovirus NOT DETECTED NOT DETECTED Final   Coronavirus 229E NOT DETECTED NOT DETECTED Final    Comment: (NOTE) The Coronavirus on the Respiratory Panel,  DOES NOT test for the novel  Coronavirus (2019 nCoV)    Coronavirus HKU1 NOT DETECTED NOT DETECTED Final   Coronavirus NL63 NOT DETECTED NOT DETECTED Final   Coronavirus OC43 NOT DETECTED NOT DETECTED Final   Metapneumovirus NOT DETECTED NOT DETECTED Final   Rhinovirus / Enterovirus NOT DETECTED NOT DETECTED Final   Influenza A NOT DETECTED NOT DETECTED Final   Influenza B NOT DETECTED NOT DETECTED Final   Parainfluenza Virus 1 NOT DETECTED NOT DETECTED Final   Parainfluenza Virus 2 NOT DETECTED NOT DETECTED Final   Parainfluenza Virus 3 NOT DETECTED NOT DETECTED Final   Parainfluenza Virus 4 NOT DETECTED NOT DETECTED Final   Respiratory Syncytial Virus NOT DETECTED NOT DETECTED Final   Bordetella pertussis NOT DETECTED NOT DETECTED Final   Bordetella Parapertussis NOT DETECTED NOT DETECTED Final   Chlamydophila pneumoniae NOT DETECTED NOT DETECTED Final   Mycoplasma pneumoniae NOT DETECTED NOT DETECTED Final    Comment: Performed at [facility], [place] [address], [place], [place] [address]     Labs: BNP (last 3 results) Recent Labs    03/06/22 1409  BNP 7,124.5*   Basic Metabolic Panel: Recent Labs  Lab 03/06/22 1409 03/06/22 2321 03/08/22 0527 03/09/22 0355  NA  141  --  140 139  K 4.6  --  3.2* 3.8  CL 109  --  99 101  CO2 24  --  31 30  GLUCOSE 95  --  100* 90  BUN 30*  --  20 21  CREATININE 1.09* 0.97 0.98 0.94  CALCIUM 9.4  --  9.1 9.2  MG  --   --  2.1  --    Liver Function Tests: No results for input(s): "AST", "ALT", "ALKPHOS", "BILITOT", "PROT", "ALBUMIN" in the last 168 hours. No results for input(s): "LIPASE", "AMYLASE" in the last 168 hours. No results for input(s): "AMMONIA" in the last 168 hours. CBC: Recent Labs  Lab 03/06/22 1409 03/06/22 2321  WBC 7.1 8.2  HGB 17.2* 17.7*  HCT 53.4* 53.8*  MCV 95.7 95.2  PLT 169 169   Cardiac Enzymes: No results for input(s): "CKTOTAL", "CKMB", "CKMBINDEX", "TROPONINI" in the last 168 hours. BNP: Invalid input(s): "POCBNP" CBG: No results for input(s): "GLUCAP" in the last 168 hours. D-Dimer No results for input(s): "DDIMER" in the last 72 hours. Hgb A1c No results for input(s): "HGBA1C" in the last 72 hours. Lipid Profile No results for input(s): "CHOL", "HDL", "LDLCALC", "TRIG", "CHOLHDL", "LDLDIRECT" in the last 72 hours. Thyroid function studies No results for input(s): "TSH", "T4TOTAL", "T3FREE", "THYROIDAB" in the last 72 hours.  Invalid input(s): "FREET3" Anemia work up Recent Labs    03/08/22 1458  FERRITIN 28   Urinalysis    Component Value Date/Time   COLORURINE YELLOW (A) 03/06/2022 1409   APPEARANCEUR CLEAR (A) 03/06/2022 1409   LABSPEC 1.013 03/06/2022 1409   PHURINE 5.0 03/06/2022 1409   GLUCOSEU NEGATIVE 03/06/2022 1409   HGBUR NEGATIVE 03/06/2022 1409   BILIRUBINUR NEGATIVE 03/06/2022 1409   KETONESUR NEGATIVE 03/06/2022 1409   PROTEINUR NEGATIVE 03/06/2022 1409   NITRITE NEGATIVE 03/06/2022 1409   LEUKOCYTESUR NEGATIVE 03/06/2022 1409   Sepsis Labs Recent Labs  Lab 03/06/22 1409 03/06/22 2321  WBC 7.1 8.2   Microbiology Recent Results (from the past 240 hour(s))  SARS Coronavirus 2 by RT PCR (hospital order, performed in [facility]  hospital lab) *cepheid single result test* Anterior Nasal Swab     Status: None   Collection Time: 03/08/22  9:21 PM   Specimen: Anterior Nasal Swab  Result Value Ref Range Status   SARS Coronavirus 2 by RT PCR NEGATIVE NEGATIVE Final    Comment: (NOTE) SARS-CoV-2 target nucleic acids are NOT DETECTED.  The SARS-CoV-2 RNA is generally detectable in upper and lower respiratory specimens during the acute phase of infection. The lowest concentration of SARS-CoV-2 viral copies this assay can detect is 250 copies / mL. A negative result does not preclude SARS-CoV-2 infection and should not be used as the sole basis for treatment or other patient management decisions.  A negative result may occur with improper specimen collection / handling, submission of specimen other than nasopharyngeal swab, presence of viral mutation(s) within the areas targeted by this assay, and inadequate number of viral copies (<250 copies / mL). A negative result must be combined with clinical observations, patient history, and epidemiological information.  Fact Sheet for Patients:   https://www.patel.info/  Fact Sheet for Healthcare Providers: https://hall.com/  This test is not yet approved or  cleared by the [place] FDA and has been authorized for detection and/or diagnosis of SARS-CoV-2 by FDA under an Emergency Use Authorization (EUA).  This EUA will remain in effect (meaning this test can be used) for the duration of the COVID-19 declaration under Section 564(b)(1) of the Act, 21 U.S.C. section 360bbb-3(b)(1), unless the authorization is terminated or revoked sooner.  Performed at [facility], [place] [place], [place] [address]   Respiratory (~20 pathogens) panel by PCR     Status: None   Collection Time: 03/08/22  9:21 PM   Specimen: Nasopharyngeal Swab; Respiratory  Result Value Ref Range Status   Adenovirus NOT DETECTED NOT  DETECTED Final   Coronavirus 229E NOT DETECTED NOT DETECTED Final    Comment: (NOTE) The Coronavirus on the Respiratory Panel, DOES NOT test for the novel  Coronavirus (2019 nCoV)    Coronavirus HKU1 NOT DETECTED NOT DETECTED Final   Coronavirus NL63 NOT DETECTED NOT DETECTED Final   Coronavirus OC43 NOT DETECTED NOT DETECTED Final   Metapneumovirus NOT DETECTED NOT DETECTED Final   Rhinovirus / Enterovirus NOT DETECTED NOT DETECTED Final   Influenza A NOT DETECTED NOT DETECTED Final   Influenza B NOT DETECTED NOT DETECTED Final   Parainfluenza Virus 1 NOT DETECTED NOT DETECTED Final   Parainfluenza Virus 2 NOT DETECTED NOT DETECTED Final   Parainfluenza Virus 3 NOT DETECTED NOT DETECTED Final   Parainfluenza Virus 4 NOT DETECTED NOT DETECTED Final   Respiratory Syncytial Virus NOT DETECTED NOT DETECTED Final   Bordetella pertussis NOT DETECTED NOT DETECTED Final   Bordetella Parapertussis NOT DETECTED NOT DETECTED Final   Chlamydophila pneumoniae NOT DETECTED NOT DETECTED Final   Mycoplasma pneumoniae NOT DETECTED NOT DETECTED Final    Comment: Performed at [facility], [place]. [address] [place], [place] [address]     Time coordinating discharge: Over 30 minutes  SIGNED:   Nolberto Hanlon, MD  [facility] 03/09/2022, 9:50 AM Pager   If 7PM-7AM, please contact night-coverage [email or web address] Password [email or web address]

## 2022-03-09 NOTE — Progress Notes (Signed)
Per [employer] 2L oxygen will be delivered to patient's hospital room by 11am, no further dc needs.   [place], [place]

## 2022-03-09 NOTE — Progress Notes (Signed)
PT Cancellation Note  Patient Details Name: Holly Werner MRN: [id number] DOB: 02/11/1951   Cancelled Treatment:    Reason Eval/Treat Not Completed: Other (comment). PT screened, no acute needs. Pt up and ambulating in room upon PT entrance. Educated on spO2 on room air (84%), oxygen tubing extenders provided, RN notified of pt status and requests. PT to sign off.    Lieutenant Diego PT, DPT 9:32 AM,03/09/22

## 2022-03-09 NOTE — Care Management Important Message (Signed)
Important Message  Patient Details  Name: Holly Werner MRN: [id number] Date of Birth: 06/08/1950   Medicare Important Message Given:  Yes     Dannette Barbara 03/09/2022, 11:44 AM

## 2022-03-09 NOTE — Progress Notes (Signed)
PULMONOLOGY         Date: 03/09/2022,   MRN# [id number] Holly Werner 24-Nov-1950     AdmissionWeight: 57.4 kg                 CurrentWeight: 50.8 kg  Referring provider: Dr. Kurtis Bushman   CHIEF COMPLAINT:   Acute hypoxemic respiratory failure   HISTORY OF PRESENT ILLNESS   This is a pleasant 71 year old female with a history of centrilobular emphysema, GERD, pulmonary hypertension, COPD, right lung nodule, history of acute appendicitis 5 years ago irritable bowel syndrome, CKD stage III, chronic neck pain, lower extremity swelling, premature menopause, tobacco abuse history, she came in due to noted hypoxemia at home with SPO2 in the 80s consistently on multiple measurements.  She has not been on inhaler therapy in the past.  She reports worsening lower extremity edema and weight gain of several pounds in the last few weeks.  She denies flulike illness sick contacts and is COVID-negative on admission.  She was noted to have elevation of BNP over 1100 on admission she had a CT PE performed there was a 10 mm subpleural right lower lobe nodule which was unchanged from 2021 and enlargement of the main pulmonary artery suggestive of pulmonary hypertension as well as findings consistent with RV failure.  03/09/22- patient is cleared from pulmonary for DC home.  Please continue sildenafil '20mg'$  TID for now and we will follow up n clinic  PAST MEDICAL HISTORY   Past Medical History:  Diagnosis Date   Chronic pain    Emphysema lung (HCC)    GERD (gastroesophageal reflux disease)    Thoracic outlet syndrome      SURGICAL HISTORY   Past Surgical History:  Procedure Laterality Date   ABDOMINAL HYSTERECTOMY     partial   LAPAROSCOPIC APPENDECTOMY N/A 12/05/2016   Procedure: APPENDECTOMY LAPAROSCOPIC;  Surgeon: Vickie Epley, MD;  Location: [facility] ORS;  Service: General;  Laterality: N/A;   THORACIC OUTLET SURGERY       FAMILY HISTORY   Family History  Problem Relation Age of  Onset   Vision loss Mother    Macular degeneration Mother    Dementia Mother    Kidney disease Father    COPD Sister    Uterine cancer Sister    Lung cancer Maternal Aunt    COPD Sister    Colon cancer Maternal Aunt    Ovarian cancer Maternal Aunt      SOCIAL HISTORY   Social History   Tobacco Use   Smoking status: Every Day    Packs/day: 0.75    Years: 51.00    Total pack years: 38.25    Types: Cigarettes   Smokeless tobacco: Never   Tobacco comments:    10 cigarettes perday  Vaping Use   Vaping Use: Never used  Substance Use Topics   Alcohol use: No    Alcohol/week: 0.0 standard drinks of alcohol   Drug use: No     MEDICATIONS    Home Medication:    Current Medication:  Current Facility-Administered Medications:    acetaminophen (TYLENOL) tablet 650 mg, 650 mg, Oral, Q6H PRN, 650 mg at 03/07/22 2259 **OR** acetaminophen (TYLENOL) suppository 650 mg, 650 mg, Rectal, Q6H PRN, Judd Gaudier V, MD   albuterol (PROVENTIL) (2.5 MG/3ML) 0.083% nebulizer solution 3 mL, 3 mL, Inhalation, Q6H PRN, Athena Masse, MD   diazepam (VALIUM) tablet 2.5 mg, 2.5 mg, Oral, BID PRN, Athena Masse, MD, 2.5  mg at 03/07/22 2256   enoxaparin (LOVENOX) injection 40 mg, 40 mg, Subcutaneous, Q24H, Athena Masse, MD, 40 mg at 03/08/22 2116   feeding supplement (ENSURE ENLIVE / ENSURE PLUS) liquid 237 mL, 237 mL, Oral, BID BM, Nolberto Hanlon, MD, 237 mL at 03/09/22 6195   furosemide (LASIX) tablet 40 mg, 40 mg, Oral, Daily, Agbor-Etang, Aaron Edelman, MD, 40 mg at 03/09/22 0932   HYDROcodone-acetaminophen (NORCO/VICODIN) 5-325 MG per tablet 1-2 tablet, 1-2 tablet, Oral, Q4H PRN, Athena Masse, MD   multivitamin with minerals tablet 1 tablet, 1 tablet, Oral, Daily, Nolberto Hanlon, MD, 1 tablet at 03/09/22 0928   ondansetron (ZOFRAN) tablet 4 mg, 4 mg, Oral, Q6H PRN **OR** ondansetron (ZOFRAN) injection 4 mg, 4 mg, Intravenous, Q6H PRN, Athena Masse, MD   potassium chloride SA (KLOR-CON M) CR  tablet 20 mEq, 20 mEq, Oral, Daily, Nolberto Hanlon, MD, 20 mEq at 03/09/22 6712   sildenafil (REVATIO) tablet 20 mg, 20 mg, Oral, TID, Ottie Glazier, MD, 20 mg at 03/09/22 4580    ALLERGIES   Escitalopram oxalate, Amoxicillin-pot clavulanate, Aspirin, Codeine, Doxycycline, and Levofloxacin     REVIEW OF SYSTEMS    Review of Systems:  Gen:  Denies  fever, sweats, chills weigh loss  HEENT: Denies blurred vision, double vision, ear pain, eye pain, hearing loss, nose bleeds, sore throat Cardiac:  No dizziness, chest pain or heaviness, chest tightness,edema Resp:   reports dyspnea chronically  Gi: Denies swallowing difficulty, stomach pain, nausea or vomiting, diarrhea, constipation, bowel incontinence Gu:  Denies bladder incontinence, burning urine Ext:   Denies Joint pain, stiffness or swelling Skin: Denies  skin rash, easy bruising or bleeding or hives Endoc:  Denies polyuria, polydipsia , polyphagia or weight change Psych:   Denies depression, insomnia or hallucinations   Other:  All other systems negative   VS: BP (!) 103/58 (BP Location: Left Arm)   Pulse 72   Temp 97.7 F (36.5 C) (Oral)   Resp 18   Ht 5' 2.99" (1.6 m)   Wt 50.8 kg   SpO2 93%   BMI 19.84 kg/m      PHYSICAL EXAM    GENERAL:NAD, no fevers, chills, no weakness no fatigue HEAD: Normocephalic, atraumatic.  EYES: Pupils equal, round, reactive to light. Extraocular muscles intact. No scleral icterus.  MOUTH: Moist mucosal membrane. Dentition intact. No abscess noted.  EAR, NOSE, THROAT: Clear without exudates. No external lesions.  NECK: Supple. No thyromegaly. No nodules. No JVD.  PULMONARY: decreased breath sounds with mild rhonchi worse at bases bilaterally.  CARDIOVASCULAR: S1 and S2. Regular rate and rhythm. No murmurs, rubs, or gallops. No edema. Pedal pulses 2+ bilaterally.  GASTROINTESTINAL: Soft, nontender, nondistended. No masses. Positive bowel sounds. No hepatosplenomegaly.   MUSCULOSKELETAL: No swelling, clubbing, or edema. Range of motion full in all extremities.  NEUROLOGIC: Cranial nerves II through XII are intact. No gross focal neurological deficits. Sensation intact. Reflexes intact.  SKIN: No ulceration, lesions, rashes, or cyanosis. Skin warm and dry. Turgor intact.  PSYCHIATRIC: Mood, affect within normal limits. The patient is awake, alert and oriented x 3. Insight, judgment intact.       IMAGING     ASSESSMENT/PLAN   Acute on chronic hypoxemic respiratory failure -Patient has multiple comorbid conditions that are contributing to dyspnea, breathlessness and hypoxemia -She has pulmonary hypertension, diastolic CHF with RV failure, she has centrilobular and paraseptal emphysema with combined pulmonary fibrosis and emphysema and COPD.  She has CKD with fluid shifts all  contributing to dyspnea.  On top of all this she is chronically physically deconditioned. -There is unclear reason for current exacerbation of CHF and she may have overlying viral LRTI.  I will send viral panel for RVP today. She is COVID negative    Combined pulmonary fibrosis and emphysema (CPFE)    - continue dulera and incruse while in house    - patient will need alpha 1 testing on outpatient.  Will perform PFT and consider evaluation for lung transplant.     Pulmonary hypertension    Likely Group 2 or 3 due to cardiac/pulmonary disease   - will start sildenafil for now    -she is not on bidil or nitro    Thank you for allowing me to participate in the care of this patient.   Patient/Family are satisfied with care plan and all questions have been answered.    Provider disclosure: Patient with at least one acute or chronic illness or injury that poses a threat to life or bodily function and is being managed actively during this encounter.  All of the below services have been performed independently by signing provider:  review of prior documentation from internal and or  external health records.  Review of previous and current lab results.  Interview and comprehensive assessment during patient visit today. Review of current and previous chest radiographs/CT scans. Discussion of management and test interpretation with health care team and patient/family.   This document was prepared using Dragon voice recognition software and may include unintentional dictation errors.     Ottie Glazier, M.D.  Division of Pulmonary & Critical Care Medicine

## 2022-03-09 NOTE — Progress Notes (Signed)
   Heart Failure Nurse Navigator Note  Met with patient today, she was sitting up in the chair at bedside.  She states that she is being discharged home.  Discussed her follow-up in the outpatient heart failure clinic, she states that she has asked her daughter to come with her to that appointment.  Also by teach back method went over daily bed weights, what to report, fluid restriction and sodium.  She needed very little reinforcement.  She also tells me that she is quitting smoking, she has done it in the past and feels that she can do it again and knows that she needs to for her health.  She had no further questions.  Pricilla Riffle RN [facility]

## 2022-03-09 NOTE — Progress Notes (Addendum)
SATURATION QUALIFICATIONS:  Patient Details Name: Holly Werner MRN: [id number] DOB: 1951/03/07  SaO2 on room air at rest = 84% Patient Saturations on Room Air while Ambulating = 83% SaO2 on 2 liters of O2 while ambulating = 90%    Lieutenant Diego PT, DPT 9:29 AM,03/09/22

## 2022-03-14 NOTE — Progress Notes (Signed)
Patient ID: Holly Werner, female    DOB: 1950-09-08, 71 y.o.   MRN: [id number]  HPI  Holly Werner is a 71 y/o female with a history of CKD, pulmonary HTN, COPD, tobacco use and chronic heart failure.   Echo report from 03/06/22 reviewed and showed an EF of 60-65% with mild MR.   Admitted 03/06/22 due to several day history of shortness of breath on exertion and leg swelling. Chest CTA negative for PE but does show pulmonary HTN. Cardiology & Pulmonology consults obtained. Did need oxygen at discharge. IV lasix provided and transitioned to oral diuretics. Discharged after 3 days.   She presents today for her initial visit with a chief complaint of minimal shortness of breath with moderate exertion. She describes this as chronic in nature although much improved. She has associated cough, light-headedness, anxiety and chronic difficulty sleeping along with this. She denies any abdominal distention, palpitations, pedal edema, chest pain, wheezing, fatigue or weight gain.   Has upcoming pulmonology appointment on 03/28/22. Wearing her oxygen at 2L around the clock.   Overall feels much better from her recent admission.   Past Medical History:  Diagnosis Date   CHF (congestive heart failure) (HCC)    Chronic kidney disease    Chronic pain    Emphysema lung (HCC)    GERD (gastroesophageal reflux disease)    Pulmonary HTN ([place])    Thoracic outlet syndrome    Past Surgical History:  Procedure Laterality Date   ABDOMINAL HYSTERECTOMY     partial   LAPAROSCOPIC APPENDECTOMY N/A 12/05/2016   Procedure: APPENDECTOMY LAPAROSCOPIC;  Surgeon: Vickie Epley, MD;  Location: [facility] ORS;  Service: General;  Laterality: N/A;   THORACIC OUTLET SURGERY     Family History  Problem Relation Age of Onset   Vision loss Mother    Macular degeneration Mother    Dementia Mother    Kidney disease Father    COPD Sister    Uterine cancer Sister    Lung cancer Maternal Aunt    COPD Sister    Colon cancer  Maternal Aunt    Ovarian cancer Maternal Aunt    Social History   Tobacco Use   Smoking status: Former    Packs/day: 0.75    Years: 51.00    Total pack years: 38.25    Types: Cigarettes    Quit date: 03/13/2022   Smokeless tobacco: Never   Tobacco comments:    10 cigarettes perday  Substance Use Topics   Alcohol use: No    Alcohol/week: 0.0 standard drinks of alcohol   Allergies  Allergen Reactions   Escitalopram Oxalate Palpitations    Loose stools   Amoxicillin-Pot Clavulanate     Other reaction(s): Vomiting   Aspirin Other (See Comments)    Stomach hurt   Codeine Other (See Comments)    stomach hurt   Doxycycline Swelling   Levofloxacin     Other reaction(s): Dizziness   Prior to Admission medications   Medication Sig Start Date End Date Taking? Authorizing Provider  diazepam (VALIUM) 5 MG tablet Take 2.5 mg by mouth 2 (two) times daily as needed for anxiety.   Yes [provider]  furosemide (LASIX) 40 MG tablet Take 1 tablet (40 mg total) by mouth daily. 03/10/22 04/09/22 Yes Nolberto Hanlon, MD  potassium chloride SA (KLOR-CON M) 20 MEQ tablet Take 1 tablet (20 mEq total) by mouth daily. 03/10/22 04/09/22 Yes Nolberto Hanlon, MD  sildenafil (REVATIO) 20 MG tablet Take 1  tablet (20 mg total) by mouth 3 (three) times daily. 03/09/22 04/08/22 Yes Nolberto Hanlon, MD  albuterol (PROVENTIL HFA;VENTOLIN HFA) 108 (90 Base) MCG/ACT inhaler Inhale 2 puffs into the lungs every 6 (six) hours as needed for wheezing or shortness of breath. Patient not taking: Reported on 03/15/2022    [provider]   Review of Systems  Constitutional:  Negative for appetite change and fatigue.  HENT:  Negative for congestion, postnasal drip and sore throat.   Eyes: Negative.   Respiratory:  Positive for cough (in the mornings) and shortness of breath. Negative for wheezing.   Cardiovascular:  Negative for chest pain, palpitations and leg swelling.  Gastrointestinal:  Negative for  abdominal distention and abdominal pain.  Endocrine: Negative.   Genitourinary: Negative.   Musculoskeletal:  Negative for back pain and neck pain.  Skin: Negative.   Allergic/Immunologic: Negative.   Neurological:  Positive for light-headedness. Negative for dizziness.  Hematological:  Negative for adenopathy. Does not bruise/bleed easily.  Psychiatric/Behavioral:  Positive for sleep disturbance (chronic trouble sleeping; sleeping on 1 pillow). Negative for dysphoric mood. The patient is nervous/anxious.    Vitals:   03/15/22 1414  BP: 126/60  Pulse: 82  Resp: 20  SpO2: 92%  Weight: 116 lb 8 oz (52.8 kg)  Height: '5\' 3"'$  (1.6 m)   Wt Readings from Last 3 Encounters:  03/15/22 116 lb 8 oz (52.8 kg)  03/09/22 112 lb (50.8 kg)  05/09/18 127 lb 3.2 oz (57.7 kg)   Lab Results  Component Value Date   CREATININE 0.94 03/09/2022   CREATININE 0.98 03/08/2022   CREATININE 0.97 03/06/2022   Physical Exam Vitals and nursing note reviewed. Exam conducted with a chaperone present (son).  Constitutional:      Appearance: Normal appearance.  HENT:     Head: Normocephalic and atraumatic.  Cardiovascular:     Rate and Rhythm: Normal rate and regular rhythm.  Pulmonary:     Effort: Pulmonary effort is normal. No respiratory distress.     Breath sounds: No wheezing or rales.  Abdominal:     General: There is no distension.     Palpations: Abdomen is soft.     Tenderness: There is no abdominal tenderness.  Musculoskeletal:        General: No tenderness.     Cervical back: Normal range of motion and neck supple.     Right lower leg: No edema.     Left lower leg: No edema.  Skin:    General: Skin is warm and dry.  Neurological:     General: No focal deficit present.     Mental Status: She is alert and oriented to person, place, and time.  Psychiatric:        Mood and Affect: Mood normal.        Behavior: Behavior normal.        Thought Content: Thought content normal.      Assessment & Plan:  1: Chronic heart failure with preserved ejection fraction without structural changes- - NYHA class II - euvolemic today - weighing daily; reminded to call for an overnight weight gain of > 2 pounds or a weekly weight gain of > 5 pounds - not adding salt to her food; reviewed the importance of keeping daily sodium intake to ~ '2000mg'$  and how to read food labels - drinking 59-71 ounces of fluid daily but the great majority of this is caffeine coffee; she drinks 45-59 ounces of coffee daily; encouraged her to  start swapping out some of her coffee with water - BNP 03/06/22 was 1100.6  2: CKD- - saw PCP Carrie Mew) 03/06/22 - BMP 03/09/22 reviewed and showed sodium 139, potassium 3.8, creatinine 0.94 and GFR >60  3: Pulmonary HTN/ COPD- - wearing oxygen at 2L around the clock - sees pulmonology Lanney Gins) 03/28/22 - may benefit from seeing the [facility]  4: Tobacco use- - stopped smoking the day she was admitted and has not resumed - had been smoking 3/4 ppd of cigarettes and had been smoking since the age of 73 - she says that she wants a cigarette but has no intention of resuming; discussed that she could use a lollipop or toothpick to simulate the smoking motion - continued cessation discussed for 3 minutes with her   Medication list reviewed.   Return in 2 months, sooner if needed.

## 2022-03-15 ENCOUNTER — Encounter: Payer: Self-pay | Admitting: Family

## 2022-03-15 ENCOUNTER — Ambulatory Visit: Payer: Medicare Other | Attending: Family | Admitting: Family

## 2022-03-15 VITALS — BP 126/60 | HR 82 | Resp 20 | Ht 63.0 in | Wt 116.5 lb

## 2022-03-15 DIAGNOSIS — I5032 Chronic diastolic (congestive) heart failure: Secondary | ICD-10-CM | POA: Insufficient documentation

## 2022-03-15 DIAGNOSIS — J449 Chronic obstructive pulmonary disease, unspecified: Secondary | ICD-10-CM | POA: Insufficient documentation

## 2022-03-15 DIAGNOSIS — Z720 Tobacco use: Secondary | ICD-10-CM

## 2022-03-15 DIAGNOSIS — N182 Chronic kidney disease, stage 2 (mild): Secondary | ICD-10-CM

## 2022-03-15 DIAGNOSIS — N189 Chronic kidney disease, unspecified: Secondary | ICD-10-CM | POA: Diagnosis present

## 2022-03-15 DIAGNOSIS — Z87891 Personal history of nicotine dependence: Secondary | ICD-10-CM | POA: Insufficient documentation

## 2022-03-15 DIAGNOSIS — I272 Pulmonary hypertension, unspecified: Secondary | ICD-10-CM | POA: Diagnosis not present

## 2022-03-15 NOTE — Patient Instructions (Addendum)
Continue weighing daily and call for an overnight weight gain of 3 pounds or more or a weekly weight gain of more than 5 pounds.   If you have voicemail, please make sure your mailbox is cleaned out so that we may leave a message and please make sure to listen to any voicemails.    Keep sodium intake to '2000mg'$  a day.    Substitute some of your coffee with water.    It was pleasure meeting you today!

## 2022-03-16 ENCOUNTER — Encounter: Payer: Self-pay | Admitting: Family

## 2022-03-21 ENCOUNTER — Encounter: Payer: Self-pay | Admitting: Internal Medicine

## 2022-03-21 ENCOUNTER — Ambulatory Visit (HOSPITAL_BASED_OUTPATIENT_CLINIC_OR_DEPARTMENT_OTHER): Payer: Medicare Other | Admitting: Internal Medicine

## 2022-03-21 ENCOUNTER — Other Ambulatory Visit
Admission: RE | Admit: 2022-03-21 | Discharge: 2022-03-21 | Disposition: A | Discharge status: Home / Self Care | Payer: Medicare Other | Source: Ambulatory Visit | Attending: Internal Medicine | Admitting: Internal Medicine

## 2022-03-21 VITALS — BP 126/65 | HR 78 | Ht 63.0 in | Wt 116.2 lb

## 2022-03-21 DIAGNOSIS — I272 Pulmonary hypertension, unspecified: Secondary | ICD-10-CM | POA: Diagnosis present

## 2022-03-21 DIAGNOSIS — Z720 Tobacco use: Secondary | ICD-10-CM | POA: Diagnosis not present

## 2022-03-21 DIAGNOSIS — I5032 Chronic diastolic (congestive) heart failure: Secondary | ICD-10-CM | POA: Diagnosis present

## 2022-03-21 LAB — BASIC METABOLIC PANEL
Anion gap: 8 (ref 5–15)
BUN: 21 mg/dL (ref 8–23)
CO2: 32 mmol/L (ref 22–32)
Calcium: 10 mg/dL (ref 8.9–10.3)
Chloride: 102 mmol/L (ref 98–111)
Creatinine, Ser: 0.96 mg/dL (ref 0.44–1.00)
GFR, Estimated: >60 mL/min (ref >60)
Glucose, Bld: 93 mg/dL (ref 70–99)
Potassium: 4.3 mmol/L (ref 3.5–5.1)
Sodium: 142 mmol/L (ref 135–145)

## 2022-03-21 LAB — BRAIN NATRIURETIC PEPTIDE: B Natriuretic Peptide: 146.1 pg/mL — ABNORMAL HIGH (ref 0.0–100.0)

## 2022-03-21 MED ORDER — POTASSIUM CHLORIDE CRYS ER 20 MEQ PO TBCR: 20.0000 meq | EXTENDED_RELEASE_TABLET | Freq: Every day | ORAL | 3 refills | Status: DC

## 2022-03-21 MED ORDER — SPIRONOLACTONE 25 MG PO TABS: 12.5000 mg | ORAL_TABLET | Freq: Every day | ORAL | 3 refills | Status: DC

## 2022-03-21 MED ORDER — FUROSEMIDE 40 MG PO TABS: 40.0000 mg | ORAL_TABLET | Freq: Every day | ORAL | 3 refills | Status: DC

## 2022-03-21 NOTE — Progress Notes (Signed)
[facility] HF CLINIC CONSULT NOTE  Referring Physician: Dr. Garen Lah Primary Care: Marinda Elk, MD Primary Cardiologist: Dr. Garen Lah  HPI:  Ms Brun is a 71 y/o female with a history of CKD, severe COPD (> 50 pacK years), previous tobacco use. Diagnosed with diastolic HF and PAH in the hospital in 10/23. Referred for further evalation of PAH.    Admitted 03/06/22 due to several day history of shortness of breath on exertion and leg swelling. Sats in 80s on admit. Chest CTA negative for PE but does show severe emphysema and pulmonary HTN. Cardiology & Pulmonology consults obtained. Did need oxygen at discharge. IV lasix provided and transitioned to oral diuretics. Discharged after 3 days. Started lasix 40 daily and sildenafil 20 tid.   Echo 10/23 EF 60-65% G1DD. Flattened septum. RV markedly dilated. Moderately HK  RVSP 70-38mHG   Here today with her daughter. Has stopped smoking since being in the hospital. Very stressed at home as her husband died recently due to HF.  Wearing her oxygen at 2L around the clock. Sats 97% at rest. Dropping to high 70s with ambulation. SOB with minimal exertion. Edema improved with lasix. No orthopnea or PND. No presyncope. No h/o PE/DVT. No h/o CTD or PAH in family.   Has upcoming pulmonology appointment on 03/28/22. (Dr. ALaural Roes     Review of Systems: [y] = yes, '[ ]'$  = no   General: Weight gain '[ ]'$ ; Weight loss '[ ]'$ ; Anorexia '[ ]'$ ; Fatigue [Blue.Reese]; Fever '[ ]'$ ; Chills '[ ]'$ ; Weakness '[ ]'$   Cardiac: Chest pain/pressure '[ ]'$ ; Resting SOB '[ ]'$ ; Exertional SOB [ y]; Orthopnea '[ ]'$ ; Pedal Edema '[ ]'$ ; Palpitations '[ ]'$ ; Syncope '[ ]'$ ; Presyncope '[ ]'$ ; Paroxysmal nocturnal dyspnea'[ ]'$   Pulmonary: Cough '[ ]'$ ; Wheezing'[ ]'$ ; Hemoptysis'[ ]'$ ; Sputum '[ ]'$ ; Snoring '[ ]'$   GI: Vomiting'[ ]'$ ; Dysphagia'[ ]'$ ; Melena'[ ]'$ ; Hematochezia '[ ]'$ ; Heartburn[y ]; Abdominal pain '[ ]'$ ; Constipation '[ ]'$ ; Diarrhea '[ ]'$ ; BRBPR '[ ]'$   GU: Hematuria'[ ]'$ ; Dysuria '[ ]'$ ; Nocturia'[ ]'$   Vascular: Pain in legs with  walking '[ ]'$ ; Pain in feet with lying flat '[ ]'$ ; Non-healing sores '[ ]'$ ; Stroke '[ ]'$ ; TIA '[ ]'$ ; Slurred speech '[ ]'$ ;  Neuro: Headaches'[ ]'$ ; Vertigo'[ ]'$ ; Seizures'[ ]'$ ; Paresthesias'[ ]'$ ;Blurred vision '[ ]'$ ; Diplopia '[ ]'$ ; Vision changes '[ ]'$   Ortho/Skin: Arthritis [ y]; Joint pain [Blue.Reese]; Muscle pain '[ ]'$ ; Joint swelling '[ ]'$ ; Back Pain [ y]; Rash '[ ]'$   Psych: Depression[ y]; Anxiety'[ ]'$   Heme: Bleeding problems '[ ]'$ ; Clotting disorders '[ ]'$ ; Anemia '[ ]'$   Endocrine: Diabetes '[ ]'$ ; Thyroid dysfunction'[ ]'$    Past Medical History:  Diagnosis Date   CHF (congestive heart failure) (HCC)    Chronic kidney disease    Chronic pain    Emphysema lung (HCC)    GERD (gastroesophageal reflux disease)    Pulmonary HTN (HCC)    Thoracic outlet syndrome     Current Outpatient Medications  Medication Sig Dispense Refill   diazepam (VALIUM) 5 MG tablet Take 2.5 mg by mouth 2 (two) times daily as needed for anxiety.     Multiple Vitamins-Iron (MULTIVITAMIN/IRON PO) Take 1 capsule by mouth daily.     spironolactone (ALDACTONE) 25 MG tablet Take 0.5 tablets (12.5 mg total) by mouth daily. 15 tablet 3   albuterol (PROVENTIL HFA;VENTOLIN HFA) 108 (90 Base) MCG/ACT inhaler Inhale 2 puffs into the lungs every 6 (six) hours as needed for wheezing or shortness of breath. (  Patient not taking: Reported on 03/15/2022)     furosemide (LASIX) 40 MG tablet Take 1 tablet (40 mg total) by mouth daily. 30 tablet 3   potassium chloride SA (KLOR-CON M) 20 MEQ tablet Take 1 tablet (20 mEq total) by mouth daily. 30 tablet 3   No current facility-administered medications for this visit.    Allergies  Allergen Reactions   Escitalopram Oxalate Palpitations    Loose stools   Amoxicillin-Pot Clavulanate     Other reaction(s): Vomiting   Aspirin Other (See Comments)    Stomach hurt   Codeine Other (See Comments)    stomach hurt   Doxycycline Swelling   Levofloxacin     Other reaction(s): Dizziness      Social History   Socioeconomic  History   Marital status: Married    Spouse name: Not on file   Number of children: Not on file   Years of education: Not on file   Highest education level: Not on file  Occupational History   Occupation: retired  Tobacco Use   Smoking status: Former    Packs/day: 0.75    Years: 51.00    Total pack years: 38.25    Types: Cigarettes    Quit date: 03/13/2022    Years since quitting: 0.0   Smokeless tobacco: Never   Tobacco comments:    10 cigarettes perday  Vaping Use   Vaping Use: Never used  Substance and Sexual Activity   Alcohol use: No    Alcohol/week: 0.0 standard drinks of alcohol   Drug use: No   Sexual activity: Not on file  Other Topics Concern   Not on file  Social History Narrative   Not on file   Social Determinants of Health   Financial Resource Strain: Not on file  Food Insecurity: No Food Insecurity (03/07/2022)   Hunger Vital Sign    Worried About Running Out of Food in the Last Year: Never true    Ran Out of Food in the Last Year: Never true  Transportation Needs: No Transportation Needs (03/07/2022)   PRAPARE - Hydrologist (Medical): No    Lack of Transportation (Non-Medical): No  Physical Activity: Not on file  Stress: Not on file  Social Connections: Not on file  Intimate Partner Violence: Not At Risk (03/07/2022)   Humiliation, Afraid, Rape, and Kick questionnaire    Fear of Current or Ex-Partner: No    Emotionally Abused: No    Physically Abused: No    Sexually Abused: No      Family History  Problem Relation Age of Onset   Vision loss Mother    Macular degeneration Mother    Dementia Mother    Kidney disease Father    COPD Sister    Uterine cancer Sister    Lung cancer Maternal Aunt    COPD Sister    Colon cancer Maternal Aunt    Ovarian cancer Maternal Aunt     Vitals:   03/21/22 1317  BP: 126/65  Pulse: 78  SpO2: 96%  Weight: 116 lb 4 oz (52.7 kg)  Height: '5\' 3"'$  (1.6 m)    PHYSICAL  EXAM: General:  Thin woman wearing O2. No respiratory difficulty at rest HEENT: normal Neck: supple. JVP 6-7  Carotids 2+ bilat; no bruits. No lymphadenopathy or thryomegaly appreciated. Cor: PMI nondisplaced. Regular rate & rhythm. N2/6 TR. Lungs: decreased breath sounds throughout  no wheeze.  Abdomen: soft, nontender, nondistended. No hepatosplenomegaly. No bruits  or masses. Good bowel sounds. Extremities: no cyanosis or rash , +clubbing, trace  edema Neuro: alert & oriented x 3, cranial nerves grossly intact. moves all 4 extremities w/o difficulty. Affect pleasant.  ASSESSMENT & PLAN:   1. PAH with cor pulmonale - Echo 10/23 EF 60-65% G1DD. Flattened septum. RV markedly dilated. Moderately HK  RVSP 70-30mHG - CT chest 10/23. No PE. 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 Will discuss portable tank/compressor with Dr. ALaural Roesnext week - Will need PFTS with DLCO - Will check auto-immune serologies for completeness sake - We discussed possible RHC but she would like to defer and I doubt it will change our management much - Continue lasix. Add spiro to help preserve K -> will need repeat labs 1-2 weeks  - Refer Pulmonary Rehab - With WHO Group III disease would stop sildenafil due to risk of shunting  2. Chronic heart failure with preserved ejection fraction  - BNP 03/06/22 was 1100.6 - [facility]- management as above - can consider switch from lasix to SGLT2i as needed   3: Severe COPD- - sees pulmonology (Lanney Gins 03/28/22 - Hopefully can get PFTs/DLCO - Stressed need to stay quit from tobacco    4: Tobacco use- - stopped smoking the day she was admitted and has not resumed - had been smoking 3/4 ppd of cigarettes and had been smoking since the age  of 89- stressed need to stay quit  DGlori Bickers MD  7:00 PM

## 2022-03-21 NOTE — Patient Instructions (Signed)
Medication Changes:  STOP Sildenafil  START Spironolactone 12.5 mg (1/2 tab) Daily  Lab Work:  Labs done today, your results will be available in MyChart, we will contact you for abnormal readings.   Testing/Procedures:  none  Referrals:  You have been referred to Pulmonary Rehab, they will call you to schedule  Special Instructions // Education:  none  Follow-Up in: 4 weeks

## 2022-03-22 LAB — CYCLIC CITRUL PEPTIDE ANTIBODY, IGG/IGA: CCP Antibodies IgG/IgA: 6 units (ref 0–19)

## 2022-03-22 LAB — ANTI-SCLERODERMA ANTIBODY: Scleroderma (Scl-70) (ENA) Antibody, IgG: <0.2 AI (ref 0.0–0.9)

## 2022-03-22 LAB — ANA: Anti Nuclear Antibody (ANA): NEGATIVE

## 2022-03-22 LAB — RHEUMATOID FACTOR: Rheumatoid fact SerPl-aCnc: 46.5 IU/mL — ABNORMAL HIGH (ref <14.0)

## 2022-03-23 ENCOUNTER — Telehealth (HOSPITAL_COMMUNITY): Payer: Self-pay | Admitting: *Deleted

## 2022-03-23 DIAGNOSIS — I5032 Chronic diastolic (congestive) heart failure: Secondary | ICD-10-CM

## 2022-03-23 LAB — ANCA TITERS
Atypical P-ANCA titer: <1:20 {titer}
C-ANCA: <1:20 {titer}
P-ANCA: <1:20 {titer}

## 2022-03-23 NOTE — Telephone Encounter (Signed)
-----   Message from Jolaine Artist, MD sent at 03/21/2022  7:40 PM EDT ----- Please have her stop potassium now that we are adding spiro. Repeat BMET 1 week

## 2022-03-23 NOTE — Telephone Encounter (Signed)
Pt aware, agreeable, and verbalized understanding, pt will return for labs next week

## 2022-03-30 ENCOUNTER — Other Ambulatory Visit
Admission: RE | Admit: 2022-03-30 | Discharge: 2022-03-30 | Disposition: A | Discharge status: Home / Self Care | Payer: Medicare Other | Source: Ambulatory Visit | Attending: Internal Medicine | Admitting: Internal Medicine

## 2022-03-30 DIAGNOSIS — I5032 Chronic diastolic (congestive) heart failure: Secondary | ICD-10-CM | POA: Diagnosis present

## 2022-03-30 LAB — BASIC METABOLIC PANEL
Anion gap: 7 (ref 5–15)
BUN: 24 mg/dL — ABNORMAL HIGH (ref 8–23)
CO2: 32 mmol/L (ref 22–32)
Calcium: 10.1 mg/dL (ref 8.9–10.3)
Chloride: 102 mmol/L (ref 98–111)
Creatinine, Ser: 1.03 mg/dL — ABNORMAL HIGH (ref 0.44–1.00)
GFR, Estimated: 57 mL/min — ABNORMAL LOW (ref >60)
Glucose, Bld: 98 mg/dL (ref 70–99)
Potassium: 4.2 mmol/L (ref 3.5–5.1)
Sodium: 141 mmol/L (ref 135–145)

## 2022-04-02 ENCOUNTER — Ambulatory Visit: Payer: Medicare Other | Admitting: Cardiology

## 2022-04-03 ENCOUNTER — Other Ambulatory Visit: Payer: Self-pay | Admitting: Pulmonary Disease

## 2022-04-03 DIAGNOSIS — I2699 Other pulmonary embolism without acute cor pulmonale: Secondary | ICD-10-CM

## 2022-04-05 ENCOUNTER — Telehealth: Payer: Self-pay

## 2022-04-05 NOTE — Telephone Encounter (Signed)
Patient called to report neck pain for the past several days. She states she had been taking tylenol without much relief, and states she did take one advil this morning, and wanted to know if Holly Werner would advise if it's okay to continue to take advil to treat her neck pain.  Per [facility], patient was encouraged to avoid advil. Patient advised she can try extra strength tylenol and heat and contact her primary care doctor for evaluation of neck pain and alternate treatment options.  Patient verbalized understanding and appreciation for advice. She states she will not take any more advil and will call her primary doctor.  Georg Ruddle, RN

## 2022-04-11 ENCOUNTER — Ambulatory Visit
Admission: RE | Admit: 2022-04-11 | Discharge: 2022-04-11 | Disposition: A | Discharge status: Home / Self Care | Payer: Medicare Other | Source: Ambulatory Visit | Attending: Pulmonary Disease | Admitting: Pulmonary Disease

## 2022-04-11 DIAGNOSIS — I2699 Other pulmonary embolism without acute cor pulmonale: Secondary | ICD-10-CM | POA: Insufficient documentation

## 2022-04-11 MED ORDER — IOHEXOL 350 MG/ML SOLN
75.0000 mL | Freq: Once | INTRAVENOUS | Status: AC | PRN
  Administered 2022-04-11: 75 mL via INTRAVENOUS

## 2022-04-12 ENCOUNTER — Other Ambulatory Visit (HOSPITAL_COMMUNITY): Payer: Self-pay

## 2022-04-17 ENCOUNTER — Ambulatory Visit: Payer: Medicare Other | Attending: Internal Medicine | Admitting: Internal Medicine

## 2022-04-17 ENCOUNTER — Encounter: Payer: Self-pay | Admitting: Internal Medicine

## 2022-04-17 ENCOUNTER — Other Ambulatory Visit (HOSPITAL_COMMUNITY): Payer: Self-pay

## 2022-04-17 VITALS — BP 122/70 | HR 78 | Resp 14 | Ht 63.0 in | Wt 118.5 lb

## 2022-04-17 DIAGNOSIS — I272 Pulmonary hypertension, unspecified: Secondary | ICD-10-CM

## 2022-04-17 DIAGNOSIS — J432 Centrilobular emphysema: Secondary | ICD-10-CM | POA: Diagnosis not present

## 2022-04-17 DIAGNOSIS — Z720 Tobacco use: Secondary | ICD-10-CM

## 2022-04-17 DIAGNOSIS — I2782 Chronic pulmonary embolism: Secondary | ICD-10-CM

## 2022-04-17 MED ORDER — APIXABAN 5 MG PO TABS: 5.0000 mg | ORAL_TABLET | Freq: Two times a day (BID) | ORAL | 2 refills | Status: DC

## 2022-04-17 NOTE — Patient Instructions (Addendum)
Medication Changes:  BEGIN taking eliquis 5 mg tablet twice daily You were given a 30 day free trial card for eliquis today. Take this with you to your pharmacy.   Lab Work:  None today  Testing/Procedures:  An ultrasound of your legs has been ordered for 04/27/22 at 2 PM, at [facility] in the [place], [address]. Phone: [phone]  Referrals: You have been referred to [facility], they will call you for an appointment.  Special Instructions // Education:  Do the following things EVERYDAY: Weigh yourself in the morning before breakfast. Write it down and keep it in a log. Take your medicines as prescribed Eat low salt foods--Limit salt (sodium) to 2000 mg per day.  Stay as active as you can everyday Limit all fluids for the day to less than 2 liters   Follow-Up in:   6 weeks    If you have any questions or concerns before your next appointment please send [place] a message through [facility] or call our office at [phone]

## 2022-04-17 NOTE — Progress Notes (Signed)
[facility] HF CLINIC NOTE  Referring Physician: Dr. Garen Lah Primary Care: Marinda Elk, MD Primary Cardiologist: Dr. Garen Lah  HPI:  Ms Shiller is a 71 y/o female with a history of CKD, severe COPD (> 50 pacK years), previous tobacco use. Diagnosed with diastolic HF and PAH in the hospital in 10/23. Referred for further evalation of PAH.    Admitted 03/06/22 due to several day history of shortness of breath on exertion and leg swelling. Sats in 80s on admit. Chest CTA negative for PE but does show severe emphysema and pulmonary HTN. Cardiology & Pulmonology consults obtained. Did need oxygen at discharge. IV lasix provided and transitioned to oral diuretics. Discharged after 3 days. Started lasix 40 daily and sildenafil 20 tid.   Echo 10/23 EF 60-65% G1DD. Flattened septum. RV markedly dilated. Moderately HK  RVSP 70-46mHG  We saw her several weeks ago for post-hospital f/u. PH serologies drawn and + for RF all others negative  Seen in Pulmonary Clinic. (Dr. ALaural Roes. CT angio 04/11/22. 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 Severe COPD.    Here today with her daughter. Has not gotten portable O2 equipment yet. Worried about possible PE in RUL, HAving trouble tolerating more than 2L O2 due to HAs. Pulse ok can drop into low 70s. Pending sleep study. Edema reolved No dizziness.     Past Medical History:  Diagnosis Date   CHF (congestive heart failure) (HCC)    Chronic kidney disease    Chronic pain    Emphysema lung (HCC)    GERD (gastroesophageal reflux disease)    Pulmonary HTN (HCC)    Thoracic outlet syndrome     Current Outpatient Medications  Medication Sig Dispense Refill   diazepam (VALIUM) 5 MG tablet Take 2.5 mg by mouth 2 (two) times daily as needed for anxiety.     furosemide (LASIX) 40 MG tablet Take 1 tablet (40 mg total) by mouth daily. 30 tablet 3   Multiple Vitamins-Iron (MULTIVITAMIN/IRON PO) Take 1 capsule by mouth daily.     spironolactone (ALDACTONE) 25 MG  tablet Take 0.5 tablets (12.5 mg total) by mouth daily. 15 tablet 3   No current facility-administered medications for this visit.    Allergies  Allergen Reactions   Escitalopram Oxalate Palpitations    Loose stools   Amoxicillin-Pot Clavulanate     Other reaction(s): Vomiting   Aspirin Other (See Comments)    Stomach hurt   Codeine Other (See Comments)    stomach hurt   Doxycycline Swelling   Levofloxacin     Other reaction(s): Dizziness      Social History   Socioeconomic History   Marital status: Married    Spouse name: Not on file   Number of children: Not on file   Years of education: Not on file   Highest education level: Not on file  Occupational History   Occupation: retired  Tobacco Use   Smoking status: Former    Packs/day: 0.75    Years: 51.00    Total pack years: 38.25    Types: Cigarettes    Quit date: 03/13/2022    Years since quitting: 0.0   Smokeless tobacco: Never   Tobacco comments:    10 cigarettes perday  Vaping Use   Vaping Use: Never used  Substance and Sexual Activity   Alcohol use: No    Alcohol/week: 0.0 standard drinks of alcohol   Drug use: No   Sexual activity: Not on file  Other Topics Concern   Not  on file  Social History Narrative   Not on file   Social Determinants of Health   Financial Resource Strain: Not on file  Food Insecurity: No Food Insecurity (03/07/2022)   Hunger Vital Sign    Worried About Running Out of Food in the Last Year: Never true    Ran Out of Food in the Last Year: Never true  Transportation Needs: No Transportation Needs (03/07/2022)   PRAPARE - Hydrologist (Medical): No    Lack of Transportation (Non-Medical): No  Physical Activity: Not on file  Stress: Not on file  Social Connections: Not on file  Intimate Partner Violence: Not At Risk (03/07/2022)   Humiliation, Afraid, Rape, and Kick questionnaire    Fear of Current or Ex-Partner: No    Emotionally Abused: No     Physically Abused: No    Sexually Abused: No      Family History  Problem Relation Age of Onset   Vision loss Mother    Macular degeneration Mother    Dementia Mother    Kidney disease Father    COPD Sister    Uterine cancer Sister    Lung cancer Maternal Aunt    COPD Sister    Colon cancer Maternal Aunt    Ovarian cancer Maternal Aunt     Vitals:   04/17/22 1142  BP: 122/70  Pulse: 78  Resp: 14  SpO2: 94%  Weight: 118 lb 8 oz (53.8 kg)  Height: '5\' 3"'$  (1.6 m)     PHYSICAL EXAM: General:  Thin woman wearing O2. No respiratory difficulty at rest HEENT: normal Neck: supple. JVP 6-7  Carotids 2+ bilat; no bruits. No lymphadenopathy or thryomegaly appreciated. Cor: PMI nondisplaced. Regular rate & rhythm. N2/6 TR. Lungs: decreased breath sounds throughout  no wheeze.  Abdomen: soft, nontender, nondistended. No hepatosplenomegaly. No bruits or masses. Good bowel sounds. Extremities: no cyanosis or rash , +clubbing, trace  edema Neuro: alert & oriented x 3, cranial nerves grossly intact. moves all 4 extremities w/o difficulty. Affect pleasant.  ASSESSMENT & PLAN:   1. PAH with cor pulmonale - Echo 10/23 EF 60-65% G1DD. Flattened septum. RV markedly dilated. Moderately HK  RVSP 70-37mHG - CT chest 10/23. No PE. Very severe emphysema - CT angio 04/11/22. 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 Severe COPD.  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 Will discuss portable tank/compressor with Dr. ALaural Roesnext week - Will need PFTS with DLCO - Auto-immune serologies negative x for + RF (non-specific). CCP negative - We discussed possible RHC but she would like to defer and I doubt it will change our management much - Volume status looks good. Continue lasix/spiro - Refer Pulmonary Rehab  2, Small RUL PE  -  reviewed with IR. Suspect small PE but cannot exclude angiosarcoma (less likely) - likely not big enough to affect PAH - start apixaban 5 bid - repeat CT 4-6 weeks - check LE Dopplers   2. Chronic heart failure with preserved ejection fraction  - BNP 03/06/22 was 1100.6 - [facility] management as above - can consider switch from lasix to SGLT2i as needed   3: Severe COPD- - Follows with pulmonology (Lanney Gins - Hopefully can get PFTs/DLCO. Needs portable O2 -  Stressed need to stay quit from tobacco    4: Tobacco use- - stopped smoking the day she was admitted and has not resumed - had been smoking 3/4 ppd of cigarettes and had been smoking since the age of 11 - stressed need to stay quit  Glori Bickers, MD  12:14 PM

## 2022-04-26 ENCOUNTER — Encounter: Payer: Self-pay | Admitting: Pulmonary Disease

## 2022-04-26 ENCOUNTER — Ambulatory Visit (INDEPENDENT_AMBULATORY_CARE_PROVIDER_SITE_OTHER): Payer: Medicare Other | Admitting: Pulmonary Disease

## 2022-04-26 ENCOUNTER — Other Ambulatory Visit
Admission: RE | Admit: 2022-04-26 | Discharge: 2022-04-26 | Disposition: A | Discharge status: Home / Self Care | Payer: Medicare Other | Source: Ambulatory Visit | Attending: Pulmonary Disease | Admitting: Pulmonary Disease

## 2022-04-26 VITALS — BP 110/70 | HR 73 | Temp 98.2°F | Ht 63.0 in | Wt 119.2 lb

## 2022-04-26 DIAGNOSIS — I2693 Single subsegmental pulmonary embolism without acute cor pulmonale: Secondary | ICD-10-CM | POA: Diagnosis not present

## 2022-04-26 DIAGNOSIS — I272 Pulmonary hypertension, unspecified: Secondary | ICD-10-CM

## 2022-04-26 DIAGNOSIS — J439 Emphysema, unspecified: Secondary | ICD-10-CM | POA: Insufficient documentation

## 2022-04-26 DIAGNOSIS — J9611 Chronic respiratory failure with hypoxia: Secondary | ICD-10-CM | POA: Diagnosis not present

## 2022-04-26 MED ORDER — ALBUTEROL SULFATE HFA 108 (90 BASE) MCG/ACT IN AERS: 2.0000 | INHALATION_SPRAY | Freq: Four times a day (QID) | RESPIRATORY_TRACT | 2 refills | Status: DC | PRN

## 2022-04-26 MED ORDER — STIOLTO RESPIMAT 2.5-2.5 MCG/ACT IN AERS: 2.0000 | INHALATION_SPRAY | Freq: Every day | RESPIRATORY_TRACT | 11 refills | Status: DC

## 2022-04-26 NOTE — Patient Instructions (Signed)
I am giving you a trial of a medication called Stiolto this is 2 puffs once a day.  We also sent a prescription for a rescue inhaler this is 2 puffs up to 4 times a day only if needed for shortness of breath.  We have sent a request to [employer] to get you qualified for a more portable source of oxygen.  The are going to check an overnight oxygen level on your current oxygen of 2 L/min to make sure that this is an appropriate level for you.  We have ordered a humidifier bottle for your concentrator this should help with preventing dryness of the nose which is what leads to headaches.  We are getting a blood test to check for a form of hereditary emphysema called alpha 1.  We are scheduling for formal breathing tests.  We will see in follow-up in 6 to 8 weeks time call sooner should any new problems arise.

## 2022-04-26 NOTE — Progress Notes (Signed)
Subjective:    Patient ID: Holly Werner, female    DOB: [birth year], 71 y.o.   MRN: [id number] Patient Care Team: Patrice Paradise, MD as PCP - General (Physician Assistant) Glory Buff, RN as Registered Nurse  Chief Complaint  Patient presents with   Consult    ED on 03/06/2022 for swelling. 03/01/2022 started swelling. Pulmonary Hypertension.  PE on CT 04/11/2022. SOB with exertion in the last 6-8 month.  On O2 since she left the hospital. DX with Emphysema/COPD in the past.    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  Previously followed with [facility] and wishing to establish care here.  He had been seen in consultation by Dr Karna Christmas on 08 March 2022 during admission to [facility] for COPD exacerbation.  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 She appears to have poor insight as to her disease process.  She was noted to have saturations in the 80% range on room air on admission to [facility] 06 March 2022.  CTA was negative for PE but did show very severe emphysema and pulmonary hypertension.  She has been on oxygen since discharge however has not been using it nocturnally only during the day and admits that this is sporadic.  She does present with oxygen today.  She was started on Lasix and sildenafil during that admission.  Echocardiogram performed 23 October showed EF of 60 to 65%, grade 1 DD and a flattened septum with her RV being markedly dilated.  She had moderate hypokinesis of the right ventricle with RSVP of 70 to 75 mmHg.  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  She is on Eliquis.  Despite her severe pulmonary disease, she is  relatively asymptomatic or at least minimizes symptoms.  I have reviewed spirometry from [facility] and it shows that the patient has relatively preserved FEV1 however, no diffusion capacity determination is available.   She does not endorse any other symptomatology today.  I have reviewed all of her   Review of Systems A 10 point review of systems was performed and it is as noted above otherwise negative.  Past Medical History:  Diagnosis Date   CHF (congestive heart failure) (HCC)    Chronic kidney disease    Chronic pain    Emphysema lung (HCC)    GERD (gastroesophageal reflux disease)    Pulmonary HTN (HCC)    Thoracic outlet syndrome    Past Surgical History:  Procedure Laterality Date   ABDOMINAL HYSTERECTOMY     partial   LAPAROSCOPIC APPENDECTOMY N/A 12/05/2016   Procedure: APPENDECTOMY LAPAROSCOPIC;  Surgeon: Ancil Linsey, MD;  Location: [facility] ORS;  Service: General;  Laterality: N/A;   THORACIC OUTLET SURGERY     Patient Active Problem List   Diagnosis Date Noted   Tobacco abuse 03/07/2022   Swelling of lower extremity    Pulmonary hypertension, unspecified (HCC)    Chronic kidney disease, stage 3a (HCC) 03/06/2022   Acute heart failure (HCC) 03/06/2022   Acute respiratory failure with hypoxia (HCC) 03/06/2022   Cervical spine arthritis 12/05/2016   COPD (chronic obstructive pulmonary disease) (HCC) 12/05/2016   GERD (gastroesophageal reflux disease) 12/05/2016  IBS (irritable bowel syndrome) 12/05/2016   Nephrolithiasis 12/05/2016   Premature menopause 12/05/2016   Pulmonary nodule, right 12/05/2016   Appendicitis 12/05/2016   Acute appendicitis with localized peritonitis 12/05/2016   Acute appendicitis 12/03/2016   Personal history of tobacco use, presenting hazards to health 10/09/2016   Cervicalgia 10/28/2014   Myofacial muscle pain 10/28/2014   Neck pain 10/28/2014   Family History  Problem Relation Age of Onset   Vision loss Mother     Macular degeneration Mother    Dementia Mother    Kidney disease Father    COPD Sister    Uterine cancer Sister    Lung cancer Maternal Aunt    COPD Sister    Colon cancer Maternal Aunt    Ovarian cancer Maternal Aunt    Social History   Tobacco Use   Smoking status: Former    Packs/day: 0.75    Years: 51.00    Total pack years: 38.25    Types: Cigarettes    Quit date: 03/13/2022    Years since quitting: 0.1   Smokeless tobacco: Never   Tobacco comments:    10 cigarettes perday  Substance Use Topics   Alcohol use: No    Alcohol/week: 0.0 standard drinks of alcohol   Allergies  Allergen Reactions   Escitalopram Oxalate Palpitations    Loose stools   Amoxicillin-Pot Clavulanate     Other reaction(s): Vomiting   Aspirin Other (See Comments)    Stomach hurt   Codeine Other (See Comments)    stomach hurt   Doxycycline Swelling   Levofloxacin     Other reaction(s): Dizziness   Current Meds  Medication Sig   apixaban (ELIQUIS) 5 MG TABS tablet Take 1 tablet (5 mg total) by mouth 2 (two) times daily.   diazepam (VALIUM) 5 MG tablet Take 2.5 mg by mouth 2 (two) times daily as needed for anxiety.   furosemide (LASIX) 40 MG tablet Take 1 tablet (40 mg total) by mouth daily.   Multiple Vitamins-Iron (MULTIVITAMIN/IRON PO) Take 1 capsule by mouth daily.   spironolactone (ALDACTONE) 25 MG tablet Take 0.5 tablets (12.5 mg total) by mouth daily.    There is no immunization history on file for this patient.     Objective:   Physical Exam BP 110/70 (BP Location: Right Arm, Cuff Size: Normal)   Pulse 73   Temp 98.2 F (36.8 C)   Ht 5\' 3"  (1.6 m)   Wt 119 lb 3.2 oz (54.1 kg)   SpO2 92%   BMI 21.12 kg/m   SpO2: 92 % O2 Device: Nasal cannula O2 Flow Rate (L/min): 2 L/min  GENERAL: Thin well-developed woman, no acute distress, fully ambulatory.  No conversational dyspnea.  Using oxygen via nasal cannula. HEAD: Normocephalic, atraumatic.  EYES: Pupils equal, round,  reactive to light.  No scleral icterus.  Mild periorbital puffiness. MOUTH: Dentition intact, oral mucosa moist. NECK: Supple. No thyromegaly. Trachea midline. No JVD.  No adenopathy. PULMONARY: Good air entry bilaterally.  No adventitious sounds. CARDIOVASCULAR: S1 and S2. Regular rate and rhythm.  No rubs, murmurs or gallops heard. ABDOMEN: Benign. MUSCULOSKELETAL: No joint deformity, + clubbing, no edema.  NEUROLOGIC: No overt focal deficit, no gait disturbance, speech is fluent. SKIN: Intact,warm,dry. PSYCH: Mood and behavior normal.      Assessment & Plan:     ICD-10-CM   1. Pulmonary emphysema, unspecified emphysema type (HCC)  J43.9 Pulmonary Function Test [facility] Only    Ambulatory Referral for DME  Pulse oximetry, overnight    CANCELED: Pulse oximetry, overnight    CANCELED: Alpha-1 antitrypsin phenotype   Does have an element of CPFE Very severe emphysema component Poor insight as to severity of disease    2. Chronic respiratory failure with hypoxia (HCC)  J96.11    Continue oxygen at 2 L/min for now Overnight oximetry to determine adequacy of O2 supplementation Evaluation for POC    3. Pulmonary hypertension, unspecified (HCC)  I27.20 Pulse oximetry, overnight    CANCELED: Pulse oximetry, overnight   Likely multifactorial Suspect group 2/3 Suspect mostly group 3    4. Single subsegmental pulmonary embolism without acute cor pulmonale (HCC)  I26.93    Continue Eliquis, recommend lifelong Given severity of pulmonary hypertension     Orders Placed This Encounter  Procedures   Ambulatory Referral for DME    Referral Priority:   Routine    Referral Type:   Durable Medical Equipment Purchase    Number of Visits Requested:   1   Pulmonary Function Test [facility] Only    Standing Status:   Future    Number of Occurrences:   1    Standing Expiration Date:   04/27/2023    Order Specific Question:   Full PFT: includes the following: basic spirometry, spirometry pre & post  bronchodilator, diffusion capacity (DLCO), lung volumes    Answer:   Full PFT    Order Specific Question:   This test can only be performed at    Answer:   [facility]   Pulse oximetry, overnight    On O2 & [employer]    Standing Status:   Future    Standing Expiration Date:   04/27/2023   Meds ordered this encounter  Medications   Tiotropium Bromide-Olodaterol (STIOLTO RESPIMAT) 2.5-2.5 MCG/ACT AERS    Sig: Inhale 2 puffs into the lungs daily.    Dispense:  4 g    Refill:  11   albuterol (VENTOLIN HFA) 108 (90 Base) MCG/ACT inhaler    Sig: Inhale 2 puffs into the lungs every 6 (six) hours as needed for wheezing or shortness of breath.    Dispense:  8 g    Refill:  2   Will see the patient in follow-up in 6 to 8 weeks time she is to call sooner should any new problems arise.  Gailen Shelter, MD Advanced [facility]ch[facility]    *This note was dictated using voice recognition software/Dragon.  Despite best efforts to proofread, errors can occur which can change the meaning. Any transcriptional errors that result from this process are unintentional and may not be fully corrected at the time of dictation.

## 2022-04-27 ENCOUNTER — Ambulatory Visit (INDEPENDENT_AMBULATORY_CARE_PROVIDER_SITE_OTHER): Payer: Medicare Other

## 2022-04-27 DIAGNOSIS — I2782 Chronic pulmonary embolism: Secondary | ICD-10-CM

## 2022-04-30 LAB — ALPHA-1-ANTITRYPSIN PHENOTYP: A-1 Antitrypsin, Ser: 173 mg/dL (ref 101–187)

## 2022-04-30 NOTE — Telephone Encounter (Signed)
Returned patients call requesting an appointment with Dr. Sung Amabile and let patient know that our schedule for Holly Werner is not open yet and I will be in touch soon to schedule as soon as I am able.    Holly Werner, NT

## 2022-05-15 ENCOUNTER — Ambulatory Visit: Payer: Medicare Other | Admitting: Family

## 2022-05-17 ENCOUNTER — Ambulatory Visit: Payer: Medicare Other | Attending: Pulmonary Disease

## 2022-05-17 DIAGNOSIS — J439 Emphysema, unspecified: Secondary | ICD-10-CM | POA: Diagnosis present

## 2022-05-17 LAB — PULMONARY FUNCTION TEST ARMC ONLY
DL/VA % pred: 21 %
DL/VA: 0.87 ml/min/mmHg/L
DLCO unc % pred: 18 %
DLCO unc: 3.49 ml/min/mmHg
FEF 25-75 Post: 2.28 L/sec
FEF 25-75 Pre: 2.27 L/sec
FEF2575-%Change-Post: 0 %
FEF2575-%Pred-Post: 132 %
FEF2575-%Pred-Pre: 131 %
FEV1-%Change-Post: 3 %
FEV1-%Pred-Post: 100 %
FEV1-%Pred-Pre: 97 %
FEV1-Post: 2.14 L
FEV1-Pre: 2.07 L
FEV1FVC-%Change-Post: 0 %
FEV1FVC-%Pred-Pre: 117 %
FEV6-%Change-Post: 2 %
FEV6-%Pred-Post: 89 %
FEV6-%Pred-Pre: 86 %
FEV6-Post: 2.4 L
FEV6-Pre: 2.34 L
FEV6FVC-%Pred-Post: 104 %
FEV6FVC-%Pred-Pre: 104 %
FVC-%Change-Post: 2 %
FVC-%Pred-Post: 84 %
FVC-%Pred-Pre: 82 %
FVC-Post: 2.4 L
FVC-Pre: 2.34 L
Post FEV1/FVC ratio: 89 %
Post FEV6/FVC ratio: 100 %
Pre FEV1/FVC ratio: 89 %
Pre FEV6/FVC Ratio: 100 %
RV % pred: 100 %
RV: 2.24 L
TLC % pred: 89 %
TLC: 4.47 L

## 2022-05-17 MED ORDER — ALBUTEROL SULFATE (2.5 MG/3ML) 0.083% IN NEBU
2.5000 mg | INHALATION_SOLUTION | Freq: Once | RESPIRATORY_TRACT | Status: AC
  Administered 2022-05-17: 2.5 mg via RESPIRATORY_TRACT

## 2022-06-01 ENCOUNTER — Institutional Professional Consult (permissible substitution): Payer: Medicare Other | Admitting: Pulmonary Disease

## 2022-06-04 ENCOUNTER — Telehealth: Payer: Self-pay

## 2022-06-04 ENCOUNTER — Telehealth: Payer: Self-pay | Admitting: Family

## 2022-06-04 NOTE — Telephone Encounter (Signed)
Patient called with concern about severe stomach pain on Saturday that she has never had before. The pain went from under her ribs all the way down to her legs and finally subsided after a couple hours. Patient is concerned as she has lost two family members in the past year due to internal bleeding and her mind automatically went to that. Patient stated she held her blood thinner for 24 hours and continued her medications with no other complaints, however, patient states when this happened she started checking her blood pressure and is concerned because her diastolic is usually between 60 and 80 and now its been reading in the 50s. I advised patient I would notify tina and if there is any reason for concern we will reach back out.   Caffie Sotto, NT

## 2022-06-04 NOTE — Telephone Encounter (Signed)
Returned patient's call r/t episode of severe stomach pain and her concerns of the possibility of internal bleeding. Per Darylene Price, FNP: If stomach pain episode continues or occurs again patient should contact their PCP or GI.  Patient verbalized understanding. She states it was a one time episode of stomach pain for 30 minutes on 06/02/22, no symptoms since. Patient states she is afraid of internal bleeding due to family member's related to internal bleeding. She held her blood thinner 1/6 and the the morning of 1/7, but has since resumed. She denies any other symptoms. No trauma, no bruising, no issues with stools. Encouraged patient to notify her doctor before stopping blood thinner. Patient acknowledged. She is scheduled in this clinic to see Dr. Haroldine Laws on 06/08/22. Pt aware.  Georg Ruddle, RN

## 2022-06-05 ENCOUNTER — Other Ambulatory Visit
Admission: RE | Admit: 2022-06-05 | Discharge: 2022-06-05 | Disposition: A | Discharge status: Home / Self Care | Payer: Medicare Other | Source: Ambulatory Visit | Attending: Adult Health | Admitting: Adult Health

## 2022-06-05 ENCOUNTER — Ambulatory Visit: Payer: Medicare Other | Admitting: Adult Health

## 2022-06-05 ENCOUNTER — Encounter: Payer: Self-pay | Admitting: Adult Health

## 2022-06-05 ENCOUNTER — Ambulatory Visit (INDEPENDENT_AMBULATORY_CARE_PROVIDER_SITE_OTHER): Payer: Medicare Other | Admitting: Adult Health

## 2022-06-05 VITALS — BP 118/60 | HR 77 | Temp 98.2°F | Ht 63.0 in | Wt 121.6 lb

## 2022-06-05 DIAGNOSIS — J439 Emphysema, unspecified: Secondary | ICD-10-CM | POA: Diagnosis present

## 2022-06-05 DIAGNOSIS — I5032 Chronic diastolic (congestive) heart failure: Secondary | ICD-10-CM | POA: Diagnosis not present

## 2022-06-05 DIAGNOSIS — I2699 Other pulmonary embolism without acute cor pulmonale: Secondary | ICD-10-CM | POA: Diagnosis not present

## 2022-06-05 DIAGNOSIS — I272 Pulmonary hypertension, unspecified: Secondary | ICD-10-CM | POA: Diagnosis not present

## 2022-06-05 DIAGNOSIS — J9611 Chronic respiratory failure with hypoxia: Secondary | ICD-10-CM

## 2022-06-05 DIAGNOSIS — I5030 Unspecified diastolic (congestive) heart failure: Secondary | ICD-10-CM | POA: Insufficient documentation

## 2022-06-05 DIAGNOSIS — Z9189 Other specified personal risk factors, not elsewhere classified: Secondary | ICD-10-CM

## 2022-06-05 LAB — CBC WITH DIFFERENTIAL/PLATELET
Abs Immature Granulocytes: 0.01 10*3/uL (ref 0.00–0.07)
Basophils Absolute: 0.1 10*3/uL (ref 0.0–0.1)
Basophils Relative: 1 %
Eosinophils Absolute: 0.3 10*3/uL (ref 0.0–0.5)
Eosinophils Relative: 4 %
HCT: 37.6 % (ref 36.0–46.0)
Hemoglobin: 12.4 g/dL (ref 12.0–15.0)
Immature Granulocytes: 0 %
Lymphocytes Relative: 13 %
Lymphs Abs: 0.9 10*3/uL (ref 0.7–4.0)
MCH: 30.3 pg (ref 26.0–34.0)
MCHC: 33 g/dL (ref 30.0–36.0)
MCV: 91.9 fL (ref 80.0–100.0)
Monocytes Absolute: 0.6 10*3/uL (ref 0.1–1.0)
Monocytes Relative: 9 %
Neutro Abs: 5 10*3/uL (ref 1.7–7.7)
Neutrophils Relative %: 73 %
Platelets: 169 10*3/uL (ref 150–400)
RBC: 4.09 MIL/uL (ref 3.87–5.11)
RDW: 12.5 % (ref 11.5–15.5)
WBC: 6.8 10*3/uL (ref 4.0–10.5)
nRBC: 0 % (ref 0.0–0.2)

## 2022-06-05 LAB — COMPREHENSIVE METABOLIC PANEL
ALT: 15 U/L (ref 0–44)
AST: 22 U/L (ref 15–41)
Albumin: 4 g/dL (ref 3.5–5.0)
Alkaline Phosphatase: 42 U/L (ref 38–126)
Anion gap: 8 (ref 5–15)
BUN: 29 mg/dL — ABNORMAL HIGH (ref 8–23)
CO2: 28 mmol/L (ref 22–32)
Calcium: 9.2 mg/dL (ref 8.9–10.3)
Chloride: 103 mmol/L (ref 98–111)
Creatinine, Ser: 0.96 mg/dL (ref 0.44–1.00)
GFR, Estimated: >60 mL/min (ref >60)
Glucose, Bld: 97 mg/dL (ref 70–99)
Potassium: 3.7 mmol/L (ref 3.5–5.1)
Sodium: 139 mmol/L (ref 135–145)
Total Bilirubin: 0.7 mg/dL (ref 0.3–1.2)
Total Protein: 7.1 g/dL (ref 6.5–8.1)

## 2022-06-05 NOTE — Assessment & Plan Note (Signed)
Diastolic heart failure, cor pulmonale-appears euvolemic on exam.  Continue on current regimen continue follow-up with cardiology

## 2022-06-05 NOTE — Assessment & Plan Note (Signed)
Continue on oxygen to maintain O2 saturations greater than 88 to 90%. Order for POC.  Plan  Patient Instructions  Continue on Stiolto 2 puffs daily  Great job not smoking.  Continue on Oxygen 2l/m at rest , 3l/m walking  Order for POC. , Use 3l/m on POC  Set up for home sleep study.  Activity as tolerated.  Call back if you change your mind on pulmonary rehab.  Labs today.  Follow up with Dr. Patsey Werner in 6-8 weeks and As needed   Please contact office for sooner follow up if symptoms do not improve or worsen or seek emergency care

## 2022-06-05 NOTE — Progress Notes (Signed)
$'@[email or web address]'U$  ID: Holly Werner, female    DOB: 02/06/1951, 73 y.o.   MRN: [id number]  Chief Complaint  Patient presents with   Follow-up    Referring provider: Marinda Elk, MD  HPI: 72 year old female former smoker quit 2023 seen for pulmonary consult April 26, 2022 to establish for COPD with emphysema and oxygen dependent respiratory failure. Hospitalization October 2023 with decompensated diastolic heart failure,  pulmonary hypertension, cor pulmonale Diagnosed with PE April 11, 2022 on CT scan  TEST/EVENTS :  03/2022 Alpha 1 MM , 173   Echo 10/23 EF 60-65% G1DD. Flattened septum. RV markedly dilated. Moderately HK  RVSP 70-74mHG   CT chest March 06, 2022 showed severe emphysematous changes, right apical parenchymal scarring, unchanged 10 mm right lower lobe nodule (since 2021), negative for PE  CT chest April 11, 2022 showed occlusive pulmonary embolism in the single right upper lobe pulmonary artery not significantly changed from study on March 06, 2022, unchanged severe background emphysema    06/05/2022 Follow up: COPD w/ Emphysema, O2 RF , Pulmonary Hypertension, PE  Patient returns for a 6-week follow-up.  Seen last visit for pulmonary consult to establish for COPD with emphysema and oxygen dependent respiratory failure.  She was hospitalized October 2023 with decompensated diastolic heart failure.  Found to have severe pulmonary hypertension and cor pulmonale.  As above 2D echo showed grade 1 diastolic dysfunction, preserved EF and RV marked dilation with RVSP at 70 to 75 mmHg.  She improved with diuresis.  Discharged on oxygen.  Patient is being followed by cardiology.  Is currently on Lasix and spironolactone.  She remains on oxygen 2 L at rest and 2.5 L with ambulation.  She needs a portable concentrator as it is difficult for her to carry around heavy tanks.  O2 saturations at rest are 74%.  She required 3 L on pulsed oxygen device to maintain O2 saturations  greater than 88 to 90%. PFTs on May 17, 2022 show no significant airflow obstruction or restriction, no significant bronchodilator response and severely decreased diffusing capacity at 18%.  FEV1 at 100%, ratio 89, FVC 84%, DLCO 18%. Last visit she was started on Stiolto.  Patient says overall she feels that she is doing about the same.  She is trying to remain active at home.  She is able to drive do her house chores and light shopping.  Does get winded with heavy activities.  She denies any flare of cough or wheezing.  She is unsure if Stiolto is helping but she is taking each day. Over the last 3 months she has had a lot of family stress.  Her husband passed away from congestive heart failure and her sister-in-law died of GI bleeding.  Patient does complain of some weakness and stomach upset a few days ago that seems to have subsided.  But is got her worried that something is going on since starting on Eliquis.  For the workup of pulmonary hypertension she has been ordered a sleep study.  Does have some daytime sleepiness and restless sleep.  It does not appear that her sleep study has been ordered to date.  Autoimmune testing was essentially negative except for a nonspecific elevated rheumatoid factor with negative CCP. 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    Allergies  Allergen Reactions   Escitalopram Oxalate Palpitations    Loose stools   Amoxicillin-Pot Clavulanate     Other reaction(s): Vomiting   Aspirin Other (See Comments)    Stomach  hurt   Codeine Other (See Comments)    stomach hurt   Doxycycline Swelling   Levofloxacin     Other reaction(s): Dizziness     There is no immunization history on file for this patient.  Past Medical History:  Diagnosis Date   CHF (congestive heart failure) (HCC)    Chronic kidney disease    Chronic pain    Emphysema lung (HCC)    GERD (gastroesophageal reflux disease)    Pulmonary HTN (HCC)    Thoracic outlet syndrome     Tobacco  History: Social History   Tobacco Use  Smoking Status Former   Packs/day: 0.75   Years: 51.00   Total pack years: 38.25   Types: Cigarettes   Quit date: 03/13/2022   Years since quitting: 0.2  Smokeless Tobacco Never   Counseling given: Not Answered   Outpatient Medications Prior to Visit  Medication Sig Dispense Refill   albuterol (VENTOLIN HFA) 108 (90 Base) MCG/ACT inhaler Inhale 2 puffs into the lungs every 6 (six) hours as needed for wheezing or shortness of breath. 8 g 2   apixaban (ELIQUIS) 5 MG TABS tablet Take 1 tablet (5 mg total) by mouth 2 (two) times daily. 60 tablet 2   diazepam (VALIUM) 5 MG tablet Take 2.5 mg by mouth 2 (two) times daily as needed for anxiety.     furosemide (LASIX) 40 MG tablet Take 1 tablet (40 mg total) by mouth daily. 30 tablet 3   Multiple Vitamins-Iron (MULTIVITAMIN/IRON PO) Take 1 capsule by mouth daily.     spironolactone (ALDACTONE) 25 MG tablet Take 0.5 tablets (12.5 mg total) by mouth daily. 15 tablet 3   Tiotropium Bromide-Olodaterol (STIOLTO RESPIMAT) 2.5-2.5 MCG/ACT AERS Inhale 2 puffs into the lungs daily. 4 g 11   No facility-administered medications prior to visit.     Review of Systems:   Constitutional:   No  weight loss, night sweats,  Fevers, chills, + fatigue, or  lassitude.  HEENT:   No headaches,  Difficulty swallowing,  Tooth/dental problems, or  Sore throat,                No sneezing, itching, ear ache, nasal congestion, post nasal drip,   CV:  No chest pain,  Orthopnea, PND, swelling in lower extremities, anasarca, dizziness, palpitations, syncope.   GI  No heartburn, indigestion, abdominal pain, nausea, vomiting, diarrhea, change in bowel habits, loss of appetite, bloody stools.   Resp:  No excess mucus, no productive cough,  No non-productive cough,  No coughing up of blood.  No change in color of mucus.  No wheezing.  No chest wall deformity  Skin: no rash or lesions.  GU: no dysuria, change in color of  urine, no urgency or frequency.  No flank pain, no hematuria   MS:  No joint pain or swelling.  No decreased range of motion.  No back pain.    Physical Exam  BP 118/60 (BP Location: Left Arm, Cuff Size: Normal)   Pulse 77   Temp 98.2 F (36.8 C)   Ht '5\' 3"'$  (1.6 m)   Wt 121 lb 9.6 oz (55.2 kg)   SpO2 95%   BMI 21.54 kg/m   GEN: A/Ox3; pleasant , NAD, thin on O2    HEENT:  [place]/AT,   NOSE-clear, THROAT-clear, no lesions, no postnasal drip or exudate noted.   NECK:  Supple w/ fair ROM; no JVD; normal carotid impulses w/o bruits; no thyromegaly or nodules palpated; no lymphadenopathy.  RESP  Clear  P & A; w/o, wheezes/ rales/ or rhonchi. no accessory muscle use, no dullness to percussion  CARD:  RRR, no m/r/g, no peripheral edema, pulses intact, no cyanosis or clubbing.  GI:   Soft & nt; nml bowel sounds; no organomegaly or masses detected.   Musco: Warm bil, no deformities or joint swelling noted.   Neuro: alert, no focal deficits noted.    Skin: Warm, no lesions or rashes    Lab Results:  CBC    Component Value Date/Time   WBC 6.8 06/05/2022 1325   RBC 4.09 06/05/2022 1325   HGB 12.4 06/05/2022 1325   HGB 14.3 12/04/2011 0954   HCT 37.6 06/05/2022 1325   HCT 42.2 12/04/2011 0954   PLT 169 06/05/2022 1325   PLT 169 12/04/2011 0954   MCV 91.9 06/05/2022 1325   MCV 94 12/04/2011 0954   MCH 30.3 06/05/2022 1325   MCHC 33.0 06/05/2022 1325   RDW 12.5 06/05/2022 1325   RDW 13.4 12/04/2011 0954   LYMPHSABS 0.9 06/05/2022 1325   MONOABS 0.6 06/05/2022 1325   EOSABS 0.3 06/05/2022 1325   BASOSABS 0.1 06/05/2022 1325    BMET    Component Value Date/Time   NA 141 03/30/2022 1416   NA 143 12/04/2011 0954   K 4.2 03/30/2022 1416   K 4.1 12/04/2011 0954   CL 102 03/30/2022 1416   CL 108 (H) 12/04/2011 0954   CO2 32 03/30/2022 1416   CO2 31 12/04/2011 0954   GLUCOSE 98 03/30/2022 1416   GLUCOSE 88 12/04/2011 0954   BUN 24 (H) 03/30/2022 1416   BUN 18  12/04/2011 0954   CREATININE 1.03 (H) 03/30/2022 1416   CREATININE 0.84 12/04/2011 0954   CALCIUM 10.1 03/30/2022 1416   CALCIUM 9.3 12/04/2011 0954   GFRNONAA 57 (L) 03/30/2022 1416   GFRNONAA >60 12/04/2011 0954   GFRAA >60 12/04/2016 0439   GFRAA >60 12/04/2011 0954    BNP    Component Value Date/Time   BNP 146.1 (H) 03/21/2022 1500    ProBNP No results found for: "PROBNP"  Imaging: No results found.  albuterol (PROVENTIL) (2.5 MG/3ML) 0.083% nebulizer solution 2.5 mg     Date Action Dose Route User   05/17/2022 1601 Given 2.5 mg Nebulization Dayton Martes, RT          Latest Ref Rng & Units 05/17/2022    4:33 PM  PFT Results  FVC-Pre L 2.34   FVC-Predicted Pre % 82   FVC-Post L 2.40   FVC-Predicted Post % 84   Pre FEV1/FVC % % 89   Post FEV1/FCV % % 89   FEV1-Pre L 2.07   FEV1-Predicted Pre % 97   FEV1-Post L 2.14   DLCO uncorrected ml/min/mmHg 3.49   DLCO UNC% % 18   DLVA Predicted % 21   TLC L 4.47   TLC % Predicted % 89   RV % Predicted % 100     No results found for: "NITRICOXIDE"      Assessment & Plan:   COPD (chronic obstructive pulmonary disease) ([place]) Patient has severe bullous emphysema on CT scan and a significantly decreased diffusing capacity.  She has no significant airflow obstruction or restriction on PFTs.  Would continue on Stiolto for now. Alpha-1 testing was normal. Recommend pulmonary rehab.  Patient declines at this time.  Plan  Patient Instructions  Continue on Stiolto 2 puffs daily  Great job not smoking.  Continue on Oxygen 2l/m at  rest , 3l/m walking  Order for POC. , Use 3l/m on POC  Set up for home sleep study.  Activity as tolerated.  Call back if you change your mind on pulmonary rehab.  Labs today.  Follow up with Dr. Patsey Berthold in 6-8 weeks and As needed   Please contact office for sooner follow up if symptoms do not improve or worsen or seek emergency care          Pulmonary hypertension,  unspecified ([place]) Severe pulmonary hypertension -who class III with underlying severe emphysema.  Will need to rule out sleep apnea.  Sleep study is pending.  Recommend in lab study as she is on oxygen and has cor pulmonale however patient declines in lab.  Will set up for home sleep study.  Continue with oxygen and diuretics.  Autoimmune and connective tissue workup was unrevealing. No significant risk factors but could consider adding HIV to her lab work going forward   Plan  Patient Instructions  Continue on Stiolto 2 puffs daily  Great job not smoking.  Continue on Oxygen 2l/m at rest , 3l/m walking  Order for POC. , Use 3l/m on POC  Set up for home sleep study.  Activity as tolerated.  Call back if you change your mind on pulmonary rehab.  Labs today.  Follow up with Dr. Patsey Berthold in 6-8 weeks and As needed   Please contact office for sooner follow up if symptoms do not improve or worsen or seek emergency care          Diastolic heart failure ([place]) Diastolic heart failure, cor pulmonale-appears euvolemic on exam.  Continue on current regimen continue follow-up with cardiology  Pulmonary embolism [facility] CT chest April 11, 2022 showed occlusive PE in the single right upper lobe pulmonary artery.-Patient is continue on Eliquis as directed.  Follow-up CT has been ordered by cardiology.  Recent venous Dopplers were negative for DVT.   Chronic respiratory failure with hypoxia (HCC) Continue on oxygen to maintain O2 saturations greater than 88 to 90%. Order for POC.  Plan  Patient Instructions  Continue on Stiolto 2 puffs daily  Great job not smoking.  Continue on Oxygen 2l/m at rest , 3l/m walking  Order for POC. , Use 3l/m on POC  Set up for home sleep study.  Activity as tolerated.  Call back if you change your mind on pulmonary rehab.  Labs today.  Follow up with Dr. Patsey Berthold in 6-8 weeks and As needed   Please contact office for sooner follow up if symptoms do not  improve or worsen or seek emergency care            Rexene Edison, NP 06/05/2022

## 2022-06-05 NOTE — Patient Instructions (Addendum)
Continue on Stiolto 2 puffs daily  Great job not smoking.  Continue on Oxygen 2l/m at rest , 3l/m walking  Order for POC. , Use 3l/m on POC  Set up for home sleep study.  Activity as tolerated.  Call back if you change your mind on pulmonary rehab.  Labs today.  Follow up with Dr. Patsey Berthold in 6-8 weeks and As needed   Please contact office for sooner follow up if symptoms do not improve or worsen or seek emergency care

## 2022-06-05 NOTE — Assessment & Plan Note (Signed)
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  She has no significant airflow obstruction or restriction on PFTs.  Would continue on Stiolto for now. Alpha-1 testing was normal. Recommend pulmonary rehab.  Patient declines at this time.  Plan  Patient Instructions  Continue on Stiolto 2 puffs daily  Great job not smoking.  Continue on Oxygen 2l/m at rest , 3l/m walking  Order for POC. , Use 3l/m on POC  Set up for home sleep study.  Activity as tolerated.  Call back if you change your mind on pulmonary rehab.  Labs today.  Follow up with Dr. Patsey Berthold in 6-8 weeks and As needed   Please contact office for sooner follow up if symptoms do not improve or worsen or seek emergency care

## 2022-06-05 NOTE — Assessment & Plan Note (Addendum)
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  Will need to rule out sleep apnea.  Sleep study is pending.  Recommend in lab study as she is on oxygen and has cor pulmonale however patient declines in lab.  Will set up for home sleep study.  Continue with oxygen and diuretics.  Autoimmune and connective tissue workup was unrevealing. No significant risk factors but could consider adding HIV to her lab work going forward   Plan  Patient Instructions  Continue on Stiolto 2 puffs daily  Great job not smoking.  Continue on Oxygen 2l/m at rest , 3l/m walking  Order for POC. , Use 3l/m on POC  Set up for home sleep study.  Activity as tolerated.  Call back if you change your mind on pulmonary rehab.  Labs today.  Follow up with Dr. Patsey Berthold in 6-8 weeks and As needed   Please contact office for sooner follow up if symptoms do not improve or worsen or seek emergency care

## 2022-06-05 NOTE — Addendum Note (Signed)
Addended by: Claudette Head A on: 06/05/2022 02:58 PM   Modules accepted: Orders

## 2022-06-05 NOTE — Addendum Note (Signed)
Addended by: Claudette Head A on: 06/05/2022 03:47 PM   Modules accepted: Orders

## 2022-06-05 NOTE — Assessment & Plan Note (Signed)
CT chest April 11, 2022 showed occlusive PE in the single right upper lobe pulmonary artery.-Patient is continue on Eliquis as directed.  Follow-up CT has been ordered by cardiology.  Recent venous Dopplers were negative for DVT.

## 2022-06-07 ENCOUNTER — Ambulatory Visit: Payer: Medicare Other | Admitting: Pulmonary Disease

## 2022-06-08 ENCOUNTER — Encounter: Payer: Self-pay | Admitting: Internal Medicine

## 2022-06-08 ENCOUNTER — Ambulatory Visit: Payer: Medicare Other | Attending: Internal Medicine | Admitting: Internal Medicine

## 2022-06-08 VITALS — BP 110/54 | HR 71 | Wt 122.6 lb

## 2022-06-08 DIAGNOSIS — J432 Centrilobular emphysema: Secondary | ICD-10-CM

## 2022-06-08 DIAGNOSIS — Z720 Tobacco use: Secondary | ICD-10-CM | POA: Diagnosis not present

## 2022-06-08 DIAGNOSIS — I272 Pulmonary hypertension, unspecified: Secondary | ICD-10-CM | POA: Diagnosis not present

## 2022-06-08 NOTE — Patient Instructions (Signed)
Will call back to make an appointment with an echo same day within next 3 weeks or so.

## 2022-06-08 NOTE — Progress Notes (Signed)
[facility] HF CLINIC NOTE  Referring Physician: Dr. Garen Lah Primary Care: Marinda Elk, MD Primary Cardiologist: Dr. Garen Lah  HPI:  Ms Willingham is a 72 y/o female with a history of CKD, severe COPD (> 50 pacK years), previous tobacco use. Diagnosed with diastolic HF and PAH in the hospital in 10/23. Referred for further evalation of PAH.    Admitted 03/06/22 due to several day history of shortness of breath on exertion and leg swelling. Sats in 80s on admit. Chest CTA negative for PE but does show severe emphysema and pulmonary HTN. Cardiology & Pulmonology consults obtained. Did need oxygen at discharge. IV lasix provided and transitioned to oral diuretics. Discharged after 3 days. Started lasix 40 daily and sildenafil 20 tid.   Echo 10/23 EF 60-65% G1DD. Flattened septum. RV markedly dilated. Moderately HK  RVSP 70-39mHG  We saw her several weeks ago for post-hospital f/u. PH serologies drawn and + for RF all others negative  Seen in Pulmonary Clinic. (Dr. ALaural Roes. CT angio 04/11/22. 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 Severe COPD.    Here today with her daughter. Following with pulmonary. Using home O2. Feels pretty good. Has sleep study pending. No edema. Able to do ADLs. No syncope or presyncope.   PFTs 05/17/22 FEV1 2.07 (97%) FVC 2.34 (82%) DLCO 18%   Past Medical History:  Diagnosis Date   CHF (congestive heart failure) (HCC)    Chronic kidney disease    Chronic pain    Emphysema lung (HCC)    GERD (gastroesophageal reflux disease)    Pulmonary HTN (HCC)    Thoracic outlet syndrome     Current Outpatient Medications  Medication Sig Dispense Refill   albuterol (VENTOLIN HFA) 108 (90 Base) MCG/ACT inhaler Inhale 2 puffs into the lungs every 6 (six) hours as needed for wheezing or shortness of breath. 8 g 2   apixaban (ELIQUIS) 5 MG TABS tablet Take 1 tablet (5 mg total) by mouth 2 (two) times daily. 60 tablet 2   diazepam (VALIUM) 5 MG tablet Take 2.5 mg by mouth  2 (two) times daily as needed for anxiety.     furosemide (LASIX) 40 MG tablet Take 1 tablet (40 mg total) by mouth daily. 30 tablet 3   Multiple Vitamins-Iron (MULTIVITAMIN/IRON PO) Take 1 capsule by mouth daily.     spironolactone (ALDACTONE) 25 MG tablet Take 0.5 tablets (12.5 mg total) by mouth daily. 15 tablet 3   Tiotropium Bromide-Olodaterol (STIOLTO RESPIMAT) 2.5-2.5 MCG/ACT AERS Inhale 2 puffs into the lungs daily. 4 g 11   No current facility-administered medications for this visit.    Allergies  Allergen Reactions   Escitalopram Oxalate Palpitations    Loose stools   Amoxicillin-Pot Clavulanate     Other reaction(s): Vomiting   Aspirin Other (See Comments)    Stomach hurt   Codeine Other (See Comments)    stomach hurt   Doxycycline Swelling   Levofloxacin     Other reaction(s): Dizziness      Social History   Socioeconomic History   Marital status: Widowed    Spouse name: Not on file   Number of children: Not on file   Years of education: Not on file   Highest education level: Not on file  Occupational History   Occupation: retired  Tobacco Use   Smoking status: Former    Packs/day: 0.75    Years: 51.00    Total pack years: 38.25    Types: Cigarettes    Quit date: 03/13/2022  Years since quitting: 0.2   Smokeless tobacco: Never  Vaping Use   Vaping Use: Never used  Substance and Sexual Activity   Alcohol use: No    Alcohol/week: 0.0 standard drinks of alcohol   Drug use: No   Sexual activity: Not on file  Other Topics Concern   Not on file  Social History Narrative   Not on file   Social Determinants of Health   Financial Resource Strain: Not on file  Food Insecurity: No Food Insecurity (03/07/2022)   Hunger Vital Sign    Worried About Running Out of Food in the Last Year: Never true    Ran Out of Food in the Last Year: Never true  Transportation Needs: No Transportation Needs (03/07/2022)   PRAPARE - Radiographer, therapeutic (Medical): No    Lack of Transportation (Non-Medical): No  Physical Activity: Not on file  Stress: Not on file  Social Connections: Not on file  Intimate Partner Violence: Not At Risk (03/07/2022)   Humiliation, Afraid, Rape, and Kick questionnaire    Fear of Current or Ex-Partner: No    Emotionally Abused: No    Physically Abused: No    Sexually Abused: No      Family History  Problem Relation Age of Onset   Vision loss Mother    Macular degeneration Mother    Dementia Mother    Kidney disease Father    COPD Sister    Uterine cancer Sister    Lung cancer Maternal Aunt    COPD Sister    Colon cancer Maternal Aunt    Ovarian cancer Maternal Aunt     Vitals:   06/08/22 1311  BP: (!) 110/54  Pulse: 71  SpO2: 96%  Weight: 122 lb 9.6 oz (55.6 kg)     PHYSICAL EXAM: General:  Thin woman wearing O2. No respiratory difficulty at rest HEENT: normal Neck: supple. no JVD. Carotids 2+ bilat; no bruits. No lymphadenopathy or thryomegaly appreciated. Cor: PMI nondisplaced. Regular rate & rhythm. No rubs, gallops or murmurs. Lungs: decreased throughout Abdomen: soft, nontender, nondistended. No hepatosplenomegaly. No bruits or masses. Good bowel sounds. Extremities: no cyanosis, clubbing, rash, edema Neuro: alert & orientedx3, cranial nerves grossly intact. moves all 4 extremities w/o difficulty. Affect pleasant   ASSESSMENT & PLAN:   1. PAH with cor pulmonale - Echo 10/23 EF 60-65% G1DD. Flattened septum. RV markedly dilated. Moderately HK  RVSP 70-12mHG - CT chest 10/23. No PE. Very severe emphysema - CT angio 04/11/22. 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 Severe COPD.  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 In the process of getting a portable tank/compressor. - Recent PFTs with normal  spirometry with markedly reduced DLCO (18%) - unclear to me how spirometry is normal based on the amount of parenchymal lung disease on CT - Auto-immune serologies negative x for + RF (non-specific). CCP negative - We again discussed possible RHC but she would like to defer and I doubt it will change our management much - Volume status looks good. Continue lasix/spiro  2, Small RUL PE  - reviewed with IR at last visit. Suspect small PE but cannot exclude angiosarcoma (less likely) - likely not big enough to affect PAH - Continue apixaban 5 bid - Will repeat CT at  next visit  - LE Dopplers negative 12/23  3. Chronic heart failure with preserved ejection fraction  - BNP 03/06/22 was 1100.6 - This is mostly RV failure from [facility] - management as above   4. Severe COPD- - Follows with pulmonology Duwayne Heck) -Plan as above   5. Tobacco use- - stopped smoking the day she was admitted and has not resumed - had been smoking 3/4 ppd of cigarettes and had been smoking since the age of 7 - congratulated her on remaining quit  Glori Bickers, MD  1:34 PM

## 2022-06-11 NOTE — Progress Notes (Signed)
ATC patient x1.  No answer, left VM to return call.

## 2022-06-12 ENCOUNTER — Telehealth: Payer: Self-pay | Admitting: Pulmonary Disease

## 2022-06-12 NOTE — Telephone Encounter (Signed)
Holly Needles, NP 06/05/2022  2:37 PM EST     Labs are essentially normal.  Kidney function is improved.  Liver function testing is normal.  Electrolytes are okay. No sign of anemia.  Hemoglobin is decreased since last check in October but she is also been started on oxygen.  No active signs of bleeding.  Patient is a follow-up as planned and as needed Please contact office for sooner follow up if symptoms do not improve or worsen or seek emergency care    Lm for patient.

## 2022-06-12 NOTE — Telephone Encounter (Signed)
Patient stated she missed a call from the office.  Please call patient back at [phone]

## 2022-06-13 NOTE — Telephone Encounter (Signed)
Patient is aware of results and voiced her understanding.  Nothing further needed.

## 2022-06-13 NOTE — Telephone Encounter (Signed)
Lm x2 for patient. Will close encounter per office protocol.   

## 2022-06-20 ENCOUNTER — Ambulatory Visit: Payer: Medicare Other | Attending: Otolaryngology

## 2022-06-20 DIAGNOSIS — J449 Chronic obstructive pulmonary disease, unspecified: Secondary | ICD-10-CM | POA: Insufficient documentation

## 2022-06-20 DIAGNOSIS — G4733 Obstructive sleep apnea (adult) (pediatric): Secondary | ICD-10-CM | POA: Diagnosis present

## 2022-06-20 DIAGNOSIS — Z9981 Dependence on supplemental oxygen: Secondary | ICD-10-CM | POA: Insufficient documentation

## 2022-06-20 DIAGNOSIS — I509 Heart failure, unspecified: Secondary | ICD-10-CM | POA: Insufficient documentation

## 2022-06-22 ENCOUNTER — Other Ambulatory Visit: Payer: Self-pay | Admitting: *Deleted

## 2022-06-22 DIAGNOSIS — I5032 Chronic diastolic (congestive) heart failure: Secondary | ICD-10-CM

## 2022-06-28 ENCOUNTER — Ambulatory Visit
Admission: RE | Admit: 2022-06-28 | Discharge: 2022-06-28 | Disposition: A | Discharge status: Home / Self Care | Payer: Medicare Other | Source: Ambulatory Visit | Attending: Internal Medicine | Admitting: Internal Medicine

## 2022-06-28 DIAGNOSIS — J449 Chronic obstructive pulmonary disease, unspecified: Secondary | ICD-10-CM | POA: Insufficient documentation

## 2022-06-28 DIAGNOSIS — I5032 Chronic diastolic (congestive) heart failure: Secondary | ICD-10-CM | POA: Diagnosis not present

## 2022-06-28 DIAGNOSIS — Z87891 Personal history of nicotine dependence: Secondary | ICD-10-CM | POA: Diagnosis not present

## 2022-06-28 DIAGNOSIS — I081 Rheumatic disorders of both mitral and tricuspid valves: Secondary | ICD-10-CM | POA: Insufficient documentation

## 2022-06-28 DIAGNOSIS — I272 Pulmonary hypertension, unspecified: Secondary | ICD-10-CM | POA: Insufficient documentation

## 2022-06-28 LAB — ECHOCARDIOGRAM COMPLETE
AR max vel: 2.65 cm2
AV Area VTI: 2.6 cm2
AV Area mean vel: 2.37 cm2
AV Mean grad: 3 mmHg
AV Peak grad: 6.1 mmHg
Ao pk vel: 1.23 m/s
Area-P 1/2: 4.77 cm2
MV VTI: 2.57 cm2
S' Lateral: 2.8 cm

## 2022-06-28 NOTE — Progress Notes (Signed)
*  PRELIMINARY RESULTS* Echocardiogram 2D Echocardiogram has been performed.  Holly Werner 06/28/2022, 10:07 AM

## 2022-06-29 ENCOUNTER — Telehealth: Payer: Self-pay

## 2022-06-29 NOTE — Telephone Encounter (Signed)
I notified the patient. She does have a humidifier bottle for her concentrator but it is not hooked up yet. She will try the 3L of O2 at night and she will let us know how it does.  Nothing further is needed.

## 2022-06-29 NOTE — Telephone Encounter (Signed)
I notified the patient. She said in the past when she has had her O2 at 3L for a long period of time it has given her a headache. Also, she said she does believe she breathes through her mouth when she sleeps, because her mouth is dry in the mornings. She wants to know if you think that would have affected her ONO results. She did have a Sleep Study done on 06/20/2022 that Tammy ordered, but the results are not back yet.

## 2022-06-29 NOTE — Telephone Encounter (Signed)
Breathing through her mouth should not affect the ONO.  If she does not have a humidifier bottle for her concentrator I would recommend that.  I have not received a result on the sleep study as soon as I have it she will be notified.

## 2022-06-29 NOTE — Telephone Encounter (Signed)
ONO reviewed by Dr. Patsey Berthold- increase O2 to 3L at night (currently set at 2L with rest and at night).   I left a message for the patient to return my call.

## 2022-07-08 ENCOUNTER — Other Ambulatory Visit: Payer: Self-pay | Admitting: Internal Medicine

## 2022-07-09 ENCOUNTER — Telehealth: Payer: Self-pay | Admitting: Adult Health

## 2022-07-09 ENCOUNTER — Other Ambulatory Visit: Payer: Self-pay | Admitting: Internal Medicine

## 2022-07-09 NOTE — Telephone Encounter (Signed)
Sleep study done on June 20, 2022 showed no significant sleep apnea with AHI at 3.9/hour and SpO2 low at 83%. Continue on oxygen. Will discuss sleep study results in detail at follow-up visit.

## 2022-07-17 ENCOUNTER — Ambulatory Visit: Payer: Medicare Other | Attending: Internal Medicine | Admitting: Internal Medicine

## 2022-07-17 ENCOUNTER — Encounter: Payer: Self-pay | Admitting: Internal Medicine

## 2022-07-17 VITALS — BP 118/52 | HR 79 | Resp 18 | Wt 122.2 lb

## 2022-07-17 DIAGNOSIS — J432 Centrilobular emphysema: Secondary | ICD-10-CM

## 2022-07-17 DIAGNOSIS — I272 Pulmonary hypertension, unspecified: Secondary | ICD-10-CM

## 2022-07-17 DIAGNOSIS — I2699 Other pulmonary embolism without acute cor pulmonale: Secondary | ICD-10-CM

## 2022-07-17 NOTE — Progress Notes (Signed)
[facility] HF CLINIC NOTE  Referring Physician: Dr. Garen Lah Primary Care: Marinda Elk, MD Primary Cardiologist: Dr. Garen Lah  HPI:  Holly Werner is a 72 y/o female with a history of CKD, severe COPD (> 50 pacK years), previous tobacco use. Diagnosed with diastolic HF and PAH in the hospital in 10/23. Referred for further evalation of PAH.    Admitted 03/06/22 due to several day history of shortness of breath on exertion and leg swelling. Sats in 80s on admit. Chest CTA negative for PE but does show severe emphysema and pulmonary HTN. Cardiology & Pulmonology consults obtained. Did need oxygen at discharge. IV lasix provided and transitioned to oral diuretics. Discharged after 3 days. Started lasix 40 daily and sildenafil 20 tid.   Echo 10/23 EF 60-65% G1DD. Flattened septum. RV markedly dilated. Moderately HK  RVSP 70-17mHG  We saw her several weeks ago for post-hospital f/u. PH serologies drawn and + for RF all others negative  Seen in Pulmonary Clinic. (Dr. ALaural Roes. CT angio 04/11/22. 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 Severe COPD.   Sleep study 06/20/22 AHI 3.9 lowest sat 83% (O2 increased to 3L at night)  Echo 06/28/22 EF 60-65% RV mildly dilated normal function  RVSP 553mG   Here today with her son. Following with pulmonary. Using home O2. Feels pretty good. Has sleep study pending. No edema. Able to do ADLs. No syncope or presyncope.   PFTs 05/17/22 FEV1 2.07 (97%) FVC 2.34 (82%) DLCO 18%   Past Medical History:  Diagnosis Date   CHF (congestive heart failure) (HCC)    Chronic kidney disease    Chronic pain    Emphysema lung (HCC)    GERD (gastroesophageal reflux disease)    Pulmonary HTN (HCC)    Thoracic outlet syndrome     Current Outpatient Medications  Medication Sig Dispense Refill   albuterol (VENTOLIN HFA) 108 (90 Base) MCG/ACT inhaler Inhale 2 puffs into the lungs every 6 (six) hours as needed for wheezing or shortness of breath. 8 g 2   diazepam  (VALIUM) 5 MG tablet Take 2.5 mg by mouth 2 (two) times daily as needed for anxiety.     ELIQUIS 5 MG TABS tablet TAKE 1 TABLET(5 MG) BY MOUTH TWICE DAILY 60 tablet 2   furosemide (LASIX) 40 MG tablet Take 1 tablet (40 mg total) by mouth daily. 30 tablet 3   Multiple Vitamins-Iron (MULTIVITAMIN/IRON PO) Take 1 capsule by mouth daily.     spironolactone (ALDACTONE) 25 MG tablet TAKE 1/2 TABLET(12.5 MG) BY MOUTH DAILY 15 tablet 3   Tiotropium Bromide-Olodaterol (STIOLTO RESPIMAT) 2.5-2.5 MCG/ACT AERS Inhale 2 puffs into the lungs daily. 4 g 11   No current facility-administered medications for this visit.    Allergies  Allergen Reactions   Escitalopram Oxalate Palpitations    Loose stools   Amoxicillin-Pot Clavulanate     Other reaction(s): Vomiting   Aspirin Other (See Comments)    Stomach hurt   Codeine Other (See Comments)    stomach hurt   Doxycycline Swelling   Levofloxacin     Other reaction(s): Dizziness      Social History   Socioeconomic History   Marital status: Widowed    Spouse name: Not on file   Number of children: Not on file   Years of education: Not on file   Highest education level: Not on file  Occupational History   Occupation: retired  Tobacco Use   Smoking status: Former    Packs/day: 0.75  Years: 51.00    Total pack years: 38.25    Types: Cigarettes    Quit date: 03/13/2022    Years since quitting: 0.3   Smokeless tobacco: Never  Vaping Use   Vaping Use: Never used  Substance and Sexual Activity   Alcohol use: No    Alcohol/week: 0.0 standard drinks of alcohol   Drug use: No   Sexual activity: Not on file  Other Topics Concern   Not on file  Social History Narrative   Not on file   Social Determinants of Health   Financial Resource Strain: Not on file  Food Insecurity: No Food Insecurity (03/07/2022)   Hunger Vital Sign    Worried About Running Out of Food in the Last Year: Never true    Ran Out of Food in the Last Year: Never true   Transportation Needs: No Transportation Needs (03/07/2022)   PRAPARE - Hydrologist (Medical): No    Lack of Transportation (Non-Medical): No  Physical Activity: Not on file  Stress: Not on file  Social Connections: Not on file  Intimate Partner Violence: Not At Risk (03/07/2022)   Humiliation, Afraid, Rape, and Kick questionnaire    Fear of Current or Ex-Partner: No    Emotionally Abused: No    Physically Abused: No    Sexually Abused: No      Family History  Problem Relation Age of Onset   Vision loss Mother    Macular degeneration Mother    Dementia Mother    Kidney disease Father    COPD Sister    Uterine cancer Sister    Lung cancer Maternal Aunt    COPD Sister    Colon cancer Maternal Aunt    Ovarian cancer Maternal Aunt     Vitals:   07/17/22 1312  BP: (!) 118/52  Pulse: 79  Resp: 18  SpO2: 96%  Weight: 122 lb 4 oz (55.5 kg)     PHYSICAL EXAM: General:  Thin woman wearing O2. No respiratory difficulty at rest HEENT: normal Neck: supple. no JVD. Carotids 2+ bilat; no bruits. No lymphadenopathy or thryomegaly appreciated. Cor: PMI nondisplaced. Regular rate & rhythm. No rubs, gallops or murmurs. Lungs: decreased throughout no wheeze Abdomen: soft, nontender, nondistended. No hepatosplenomegaly. No bruits or masses. Good bowel sounds. Extremities: no cyanosis, clubbing, rash, edema Neuro: alert & orientedx3, cranial nerves grossly intact. moves all 4 extremities w/o difficulty. Affect pleasant  ASSESSMENT & PLAN:   1. PAH with cor pulmonale - Echo 10/23 EF 60-65% G1DD. Flattened septum. RV markedly dilated. Moderately HK  RVSP 70-85mHG - CT chest 10/23. No PE. Very severe emphysema - CT angio 04/11/22. 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 Severe COPD.  - Almost certainly WHO GROUP III disease and thus mainstay of therapy will be smoking cessation and O2 support to keep sats > 88% at rest, exertion and sleep  - Discussed need to increase  O2 to keep sats up with exertion. In the process of getting a portable tank/compressor. - PFTs with normal spirometry with markedly reduced DLCO (18%) - unclear to me how spirometry is normal based on the amount of parenchymal lung disease on CT - Auto-immune serologies negative x for + RF (non-specific). CCP negative - Echo 06/28/22 EF 60-65% RV mildly dilated normal function  RVSP 53mG - Volume status looks good. Continue lasix/spiro - Echo today with much improved RV function and reduced pulmonary pressures. Continue to keep O2 sats >88% - Refere Pulmoanry rehab  2, Small RUL PE  - reviewed with IR at last visit. Suspect small PE but cannot exclude angiosarcoma (less likely) - likely not big enough to affect PAH - Continue apixaban 5 bid - LE Dopplers negative 12/23 - Repeat CT  3. Chronic heart failure with preserved ejection fraction  - BNP 03/06/22 was 1100.6 - This is mostly RV failure from [facility] - management as above. Volume status looks good   4. Severe COPD- - Follows with pulmonology Duwayne Heck) - OnOx stil with hypoxemia. She was asked to increase night O2 2L-> 3L but couldn't tolerate due to HA. I asked her to try 2.5L.  - Refer to pulmonary rehab   5. Tobacco use- - stopped smoking the day she was admitted and has not resumed - had been smoking 3/4 ppd of cigarettes and had been smoking since the age of 62 - Remains quit  Glori Bickers, MD  1:55 PM

## 2022-07-17 NOTE — Patient Instructions (Signed)
Medication Changes:  None, continue current medications  Lab Work:  none  Testing/Procedures:  Non-Cardiac CT Angiography (CTA), is a special type of CT scan that uses a computer to produce multi-dimensional views of major blood vessels throughout the body. In CT angiography, a contrast material is injected through an IV to help visualize the blood vessels  Referrals:  You have been referred to Pulmonary Rehab, they will call you to schedule  Special Instructions // Education:  none  Follow-Up in: 3 months (May 2024), **PLEASE CALL OUR OFFICE IN MARCH TO SCHEDULE THIS APPOINTMENT    If you have any questions or concerns before your next appointment please send [place] a message through mychart or call our office at [phone] Monday-Friday 8 am-5 pm.   If you have an urgent need after hours on the weekend please call your Primary Cardiologist or the [facility] in [place] at [phone].

## 2022-07-19 ENCOUNTER — Ambulatory Visit: Payer: Medicare Other

## 2022-07-24 NOTE — Telephone Encounter (Signed)
ATC x1.  LVM to return call. 

## 2022-07-25 ENCOUNTER — Ambulatory Visit
Admission: RE | Admit: 2022-07-25 | Discharge: 2022-07-25 | Disposition: A | Discharge status: Home / Self Care | Payer: Medicare Other | Source: Ambulatory Visit | Attending: Internal Medicine | Admitting: Internal Medicine

## 2022-07-25 DIAGNOSIS — I2699 Other pulmonary embolism without acute cor pulmonale: Secondary | ICD-10-CM

## 2022-07-25 MED ORDER — IOHEXOL 350 MG/ML SOLN
75.0000 mL | Freq: Once | INTRAVENOUS | Status: AC | PRN
  Administered 2022-07-25: 75 mL via INTRAVENOUS

## 2022-07-25 NOTE — Telephone Encounter (Signed)
Spoke with patient went over sleep study results. Advised at next visits results would be discussed in detail. She verbalized  understanding. Nothing further needed.

## 2022-07-25 NOTE — Telephone Encounter (Signed)
Patient is returning phone call. Patient phone number is [phone].

## 2022-07-31 ENCOUNTER — Other Ambulatory Visit: Payer: Self-pay | Admitting: Internal Medicine

## 2022-08-01 ENCOUNTER — Other Ambulatory Visit: Payer: Self-pay | Admitting: Family Medicine

## 2022-08-01 DIAGNOSIS — M5412 Radiculopathy, cervical region: Secondary | ICD-10-CM

## 2022-08-16 ENCOUNTER — Encounter: Payer: Self-pay | Admitting: Pulmonary Disease

## 2022-08-16 ENCOUNTER — Ambulatory Visit (INDEPENDENT_AMBULATORY_CARE_PROVIDER_SITE_OTHER): Payer: Medicare Other | Admitting: Pulmonary Disease

## 2022-08-16 VITALS — BP 110/60 | HR 63 | Temp 98.0°F | Ht 63.0 in | Wt 120.4 lb

## 2022-08-16 DIAGNOSIS — J9611 Chronic respiratory failure with hypoxia: Secondary | ICD-10-CM

## 2022-08-16 DIAGNOSIS — I272 Pulmonary hypertension, unspecified: Secondary | ICD-10-CM | POA: Diagnosis not present

## 2022-08-16 DIAGNOSIS — I2699 Other pulmonary embolism without acute cor pulmonale: Secondary | ICD-10-CM | POA: Diagnosis not present

## 2022-08-16 DIAGNOSIS — J439 Emphysema, unspecified: Secondary | ICD-10-CM | POA: Diagnosis not present

## 2022-08-16 NOTE — Patient Instructions (Signed)
Try using your oxygen at 2-1/2 L/min during sleep.  Use the water reservoir for your concentrator.  Fill it only with distilled water.  This will help with reducing the likelihood of headaches.  Continue using the Stiolto 2 puffs daily.  We will see on follow-up in 3 months time call sooner should any new problems arise.

## 2022-08-16 NOTE — Progress Notes (Signed)
Subjective:    Patient ID: Holly Werner, female    DOB: September 14, 1950, 72 y.o.   MRN: [id number] Patient Care Team: Marinda Elk, MD as PCP - General (Physician Assistant) Telford Nab, RN as Registered Nurse Tyler Pita, MD as Consulting Physician (Pulmonary Disease) Bensimhon, Shaune Pascal, MD as Consulting Physician (Cardiology)  Chief Complaint  Patient presents with   Follow-up    SOB with exertion. No wheezing. Positive for Covid on 08/03/2022. Still has some congestion from that. Cough with yellow sputum.     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  Previously followed with [facility] and establish care here on 26 April 2022.  She was last seen by Rexene Edison NP on 05 June 2022, at that time pulmonary rehab was recommended, the patient declined.  She presents today for follow-up.  We had recently had her evaluated for a portable oxygen concentrator however the patient needs continuous flow and could not maintain adequate oxygen saturations with a pulsed delivery portable oxygen concentrator.  Despite this the patient comes in today with a portable oxygen concentrator that she got from "a friend" with only 1 L flow and oxygen saturations were 85%.  She was advised that she needs to maintain adequate oxygen levels particularly given her pulmonary hypertension.  She appears to have poor insight as to her disease process.  Currently maintained on Stiolto 2 inhalations twice a day.  She does not use albuterol at all.  She quit smoking in October 2023 and has not relapsed.  She did not increase her oxygen supplementation at nighttime to 3 L citing headaches.  I have advised her to try 2.5 L/min.  She does not endorse any other symptomatology today.  DATA 04/26/2022 Alpha 1: MM , 173  05/17/2022 PFT: FEV1 2.07 L or  97% predicted, FVC 2.34 L or 82% of predicted, FEV1/FVC 89%, no bronchodilator response.  Lung volumes overall normal.  Overall no restriction, diffusion capacity severely impaired. 06/16/2022 sleep study: No evidence of sleep apnea, the patient did exhibit significant oxygen desaturations related to pulmonary disease. 06/28/2022 echocardiogram: LVEF 60 to 65%, moderately elevated pulmonary artery systolic pressure, RV systolic pressure calculated at 57.6 mmHg.  Moderate mitral valve regurgitation, moderate tricuspid valve regurgitation.  Review of Systems A 10 point review of systems was performed and it is as noted above otherwise negative.  Patient Active Problem List   Diagnosis Date Noted   Diastolic heart failure ([place]) 06/05/2022   Pulmonary embolism ([place]) 06/05/2022   Chronic respiratory failure with hypoxia ([place]) 06/05/2022   Tobacco abuse 03/07/2022   Swelling of lower extremity    Pulmonary hypertension, unspecified ([place])    Chronic kidney disease, stage 3a ([place]) 03/06/2022   Acute heart failure ([place]) 03/06/2022   Acute respiratory failure with hypoxia ([place]) 03/06/2022   Cervical spine arthritis 12/05/2016   COPD (chronic obstructive pulmonary disease) ([place]) 12/05/2016   GERD (gastroesophageal reflux disease) 12/05/2016   IBS (irritable bowel syndrome) 12/05/2016   Nephrolithiasis 12/05/2016   Premature menopause 12/05/2016   Pulmonary nodule, right 12/05/2016   Appendicitis 12/05/2016   Acute appendicitis with localized peritonitis 12/05/2016   Acute appendicitis 12/03/2016   Personal history of tobacco use, presenting hazards to health 10/09/2016   Cervicalgia 10/28/2014   Myofacial muscle pain 10/28/2014   Neck pain 10/28/2014   Social  History   Tobacco Use   Smoking status: Former    Packs/day: 0.75    Years: 51.00    Additional pack years: 0.00    Total pack years: 38.25    Types: Cigarettes    Quit date: 03/13/2022    Years since quitting: 0.4   Smokeless  tobacco: Never  Substance Use Topics   Alcohol use: No    Alcohol/week: 0.0 standard drinks of alcohol   Allergies  Allergen Reactions   Escitalopram Oxalate Palpitations    Loose stools   Amoxicillin-Pot Clavulanate     Other reaction(s): Vomiting   Aspirin Other (See Comments)    Stomach hurt   Codeine Other (See Comments)    stomach hurt   Doxycycline Swelling   Levofloxacin     Other reaction(s): Dizziness   Current Meds  Medication Sig   acetaminophen (TYLENOL) 500 MG tablet Take 500 mg by mouth as needed.   albuterol (VENTOLIN HFA) 108 (90 Base) MCG/ACT inhaler Inhale 2 puffs into the lungs every 6 (six) hours as needed for wheezing or shortness of breath.   diazepam (VALIUM) 5 MG tablet Take 2.5 mg by mouth 2 (two) times daily as needed for anxiety.   ELIQUIS 5 MG TABS tablet TAKE 1 TABLET(5 MG) BY MOUTH TWICE DAILY   furosemide (LASIX) 40 MG tablet TAKE 1 TABLET(40 MG) BY MOUTH DAILY   HYDROcodone-acetaminophen (NORCO/VICODIN) 5-325 MG tablet Take 1 tablet by mouth every 6 (six) hours as needed.   Multiple Vitamins-Iron (MULTIVITAMIN/IRON PO) Take 1 capsule by mouth daily.   spironolactone (ALDACTONE) 25 MG tablet TAKE 1/2 TABLET(12.5 MG) BY MOUTH DAILY   Tiotropium Bromide-Olodaterol (STIOLTO RESPIMAT) 2.5-2.5 MCG/ACT AERS Inhale 2 puffs into the lungs daily.    There is no immunization history on file for this patient.     Objective:   Physical Exam BP 110/60 (BP Location: Left Arm, Cuff Size: Normal)   Pulse 63   Temp 98 F (36.7 C)   Ht 5\' 3"  (1.6 m)   Wt 120 lb 6.4 oz (54.6 kg)   SpO2 94%   BMI 21.33 kg/m   SpO2: 94 % O2 Device: Nasal cannula O2 Flow Rate (L/min): 2 L/min O2 Type: Pulse O2  GENERAL: Thin well-developed woman, no acute distress, fully ambulatory.  No conversational dyspnea.  Using oxygen via nasal cannula. HEAD: Normocephalic, atraumatic.  EYES: Pupils equal, round, reactive to light.  No scleral icterus.  MOUTH: Nose/mouth/throat  not examined due to institutional masking requirements. NECK: Supple. No thyromegaly. Trachea midline. No JVD.  No adenopathy. PULMONARY: Good air entry bilaterally.  No adventitious sounds. CARDIOVASCULAR: S1 and S2. Regular rate and rhythm.  No rubs, murmurs or gallops heard. ABDOMEN: Benign. MUSCULOSKELETAL: No joint deformity, no clubbing, no edema.  NEUROLOGIC: No overt focal deficit, no gait disturbance, speech is fluent. SKIN: Intact,warm,dry. PSYCH: Mood and behavior normal.      Assessment & Plan:      ICD-10-CM   1. Pulmonary emphysema, unspecified emphysema type (HCC)  J43.9    Severe Continue Stiolto 2 inhalations the daily Continue as needed albuterol    2. Pulmonary hypertension, unspecified ([place])  I27.20    Likely due to chronic hypoxic with a constriction Vital maintain O2 sats above 88% Oxygen supplementation compliance reiterated    3. Chronic respiratory failure with hypoxia (HCC)  J96.11    Needs 3 L/min with ambulation Try 2.5 L/min at nighttime with deep due to persistent hypoxemia    4. Pulmonary embolism,  other, unspecified chronicity, unspecified whether acute cor pulmonale present (HCC)  I26.99    Small chronic PE Continue Eliquis     Patient was instructed to try oxygen at 2-1/2 L/min during sleep.  She was also instructed to use the water reservoir in her concentrator and fill it only with distilled water this will also help with reducing the likelihood of headaches.  Was advised to continue Stiolto 2 inhalations daily we will see her in follow-up in 3 months time she is to contact us prior to that time should any new difficulties arise.  Renold Don, MD Advanced Bronchoscopy PCCM [facility]    *This note was dictated using voice recognition software/Dragon.  Despite best efforts to proofread, errors can occur which can change the meaning. Any transcriptional errors that result from this process are unintentional and may  not be fully corrected at the time of dictation.

## 2022-08-17 ENCOUNTER — Ambulatory Visit
Admission: RE | Admit: 2022-08-17 | Discharge: 2022-08-17 | Disposition: A | Discharge status: Home / Self Care | Payer: Medicare Other | Source: Ambulatory Visit | Attending: Family Medicine | Admitting: Family Medicine

## 2022-08-17 DIAGNOSIS — M5412 Radiculopathy, cervical region: Secondary | ICD-10-CM

## 2022-08-18 ENCOUNTER — Encounter: Payer: Self-pay | Admitting: Pulmonary Disease

## 2022-08-28 ENCOUNTER — Telehealth (HOSPITAL_COMMUNITY): Payer: Self-pay

## 2022-08-28 NOTE — Telephone Encounter (Signed)
[place] from [facility] called about a medical clearance she faxed over for this patient. Can you look into this?

## 2022-08-29 NOTE — Telephone Encounter (Signed)
Holly Werner,  We have not received a fax for medical clearance regarding this pt.   I called [facility] today [phone]). They will message the Dr. Parks Ranger requires the clearance /&  will fax the form to our clinic for clearance.  Fax and phone number provided for our AHF clinic.

## 2022-08-30 ENCOUNTER — Telehealth: Payer: Self-pay

## 2022-08-30 NOTE — Telephone Encounter (Signed)
Fax received from [place] clinic requesting clearance for pt to hold Eliquis 3 days prior to Cervical Epidural Steroid injection on 09/10/2022.  Fax sent to [facility] [phone])  with clearance to hold Eliquis for 3 days as requested.  Called pt to make aware of clearance and to hold Eliquis for 3 days prior to her Cervical Epidural steroid injection and to start back ASAP.  Pt aware, agreeable, and verbalized understanding.

## 2022-09-13 ENCOUNTER — Other Ambulatory Visit: Payer: Self-pay | Admitting: Internal Medicine

## 2022-10-03 ENCOUNTER — Encounter (HOSPITAL_COMMUNITY): Payer: Medicare Other | Admitting: Internal Medicine

## 2022-10-04 ENCOUNTER — Telehealth (HOSPITAL_COMMUNITY): Payer: Self-pay

## 2022-10-04 NOTE — Telephone Encounter (Signed)
Patient called about needing another injection in her neck. They sent form to [facility] and Inetta Fermo will not be there to sign. She needs permission to have procedure done. She wants to find out what she should do. She wanted to talk with you only.

## 2022-10-07 NOTE — Progress Notes (Unsigned)
[facility] HF CLINIC NOTE  Referring Physician: Dr. Azucena Cecil Primary Care: Patrice Paradise, MD Primary Cardiologist: Dr. Azucena Cecil  HPI:  Holly Werner is a 72 y/o female with a history of CKD, severe COPD (> 50 pacK years), previous tobacco use. Diagnosed with diastolic HF and PAH in the hospital in 10/23. Referred for further evalation of PAH.    Admitted 03/06/22 due to several day history of shortness of breath on exertion and leg swelling. Sats in 80s on admit. Chest CTA negative for PE but does show severe emphysema and pulmonary HTN. Cardiology & Pulmonology consults obtained. Did need oxygen at discharge. IV lasix provided and transitioned to oral diuretics. Discharged after 3 days. Started lasix 40 daily and sildenafil 20 tid.   Echo 10/23 EF 60-65% G1DD. Flattened septum. RV markedly dilated. Moderately HK  RVSP 70-29mmHG  We saw her several weeks ago for post-hospital f/u. PH serologies drawn and + for RF all others negative  Seen in Pulmonary Clinic. (Dr. Tim Lair). CT angio 04/11/22. 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 Severe COPD.   Sleep study 06/20/22 AHI 3.9 lowest sat 83% (O2 increased to 3L at night)  Echo 06/28/22 EF 60-65% RV mildly dilated normal function  RVSP  Sleep study AHI 3.9 Sats as low as 83%   Here today with her son. Following with pulmonary. Using home O2. Feels pretty good. Has sleep study pending. No edema. Able to do ADLs. No syncope or presyncope.   PFTs 05/17/22 FEV1 2.07 (97%) FVC 2.34 (82%) DLCO 18%   Past Medical History:  Diagnosis Date   CHF (congestive heart failure) (HCC)    Chronic kidney disease    Chronic pain    Emphysema lung (HCC)    GERD (gastroesophageal reflux disease)    Pulmonary HTN (HCC)    Thoracic outlet syndrome     Current Outpatient Medications  Medication Sig Dispense Refill   acetaminophen (TYLENOL) 500 MG tablet Take 500 mg by mouth as needed.     albuterol (VENTOLIN HFA) 108 (90 Base) MCG/ACT inhaler  Inhale 2 puffs into the lungs every 6 (six) hours as needed for wheezing or shortness of breath. 8 g 2   diazepam (VALIUM) 5 MG tablet Take 2.5 mg by mouth 2 (two) times daily as needed for anxiety.     ELIQUIS 5 MG TABS tablet TAKE 1 TABLET(5 MG) BY MOUTH TWICE DAILY 60 tablet 2   furosemide (LASIX) 40 MG tablet TAKE 1 TABLET(40 MG) BY MOUTH DAILY 30 tablet 3   HYDROcodone-acetaminophen (NORCO/VICODIN) 5-325 MG tablet Take 1 tablet by mouth every 6 (six) hours as needed.     Multiple Vitamins-Iron (MULTIVITAMIN/IRON PO) Take 1 capsule by mouth daily.     spironolactone (ALDACTONE) 25 MG tablet TAKE 1/2 TABLET(12.5 MG) BY MOUTH DAILY 15 tablet 3   Tiotropium Bromide-Olodaterol (STIOLTO RESPIMAT) 2.5-2.5 MCG/ACT AERS Inhale 2 puffs into the lungs daily. 4 g 11   No current facility-administered medications for this visit.    Allergies  Allergen Reactions   Escitalopram Oxalate Palpitations    Loose stools   Amoxicillin-Pot Clavulanate     Other reaction(s): Vomiting   Aspirin Other (See Comments)    Stomach hurt   Codeine Other (See Comments)    stomach hurt   Doxycycline Swelling   Levofloxacin     Other reaction(s): Dizziness      Social History   Socioeconomic History   Marital status: Widowed    Spouse name: Not on file  Number of children: Not on file   Years of education: Not on file   Highest education level: Not on file  Occupational History   Occupation: retired  Tobacco Use   Smoking status: Former    Packs/day: 0.75    Years: 51.00    Additional pack years: 0.00    Total pack years: 38.25    Types: Cigarettes    Quit date: 03/06/2022    Years since quitting: 0.5   Smokeless tobacco: Never  Vaping Use   Vaping Use: Never used  Substance and Sexual Activity   Alcohol use: No    Alcohol/week: 0.0 standard drinks of alcohol   Drug use: No   Sexual activity: Not on file  Other Topics Concern   Not on file  Social History Narrative   Not on file    Social Determinants of Health   Financial Resource Strain: Not on file  Food Insecurity: No Food Insecurity (03/07/2022)   Hunger Vital Sign    Worried About Running Out of Food in the Last Year: Never true    Ran Out of Food in the Last Year: Never true  Transportation Needs: No Transportation Needs (03/07/2022)   PRAPARE - Administrator, Civil Service (Medical): No    Lack of Transportation (Non-Medical): No  Physical Activity: Not on file  Stress: Not on file  Social Connections: Not on file  Intimate Partner Violence: Not At Risk (03/07/2022)   Humiliation, Afraid, Rape, and Kick questionnaire    Fear of Current or Ex-Partner: No    Emotionally Abused: No    Physically Abused: No    Sexually Abused: No      Family History  Problem Relation Age of Onset   Vision loss Mother    Macular degeneration Mother    Dementia Mother    Kidney disease Father    COPD Sister    Uterine cancer Sister    Lung cancer Maternal Aunt    COPD Sister    Colon cancer Maternal Aunt    Ovarian cancer Maternal Aunt     There were no vitals filed for this visit.    PHYSICAL EXAM: General:  Thin woman wearing O2. No respiratory difficulty at rest HEENT: normal Neck: supple. no JVD. Carotids 2+ bilat; no bruits. No lymphadenopathy or thryomegaly appreciated. Cor: PMI nondisplaced. Regular rate & rhythm. No rubs, gallops or murmurs. Lungs: decreased throughout no wheeze Abdomen: soft, nontender, nondistended. No hepatosplenomegaly. No bruits or masses. Good bowel sounds. Extremities: no cyanosis, clubbing, rash, edema Neuro: alert & orientedx3, cranial nerves grossly intact. moves all 4 extremities w/o difficulty. Affect pleasant  ASSESSMENT & PLAN:   1. PAH with cor pulmonale - Echo 10/23 EF 60-65% G1DD. Flattened septum. RV markedly dilated. Moderately HK  RVSP 70-29mmHG - CT chest 10/23. No PE. Very severe emphysema - CT angio 04/11/22. 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  Severe COPD.  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 In the process of getting a portable tank/compressor. - PFTs with normal spirometry with markedly reduced DLCO (18%) - unclear to me how spirometry is normal based on the amount of parenchymal lung disease on CT - Auto-immune serologies negative x for + RF (non-specific). CCP negative - Echo 06/28/22 EF 60-65% RV mildly dilated normal  function (much improved from previous) RVSP 58 - Volume status looks good. Continue lasix/spiro - Continue to keep O2 sats >88% - Refere Pulmoanry rehab  2, Small RUL PE  - reviewed with IR. Suspect small PE but cannot exclude angiosarcoma (less likely) - F/u CT 2/24 partial recannalization of small PE - likely not big enough to affect PAH - Continue apixaban 5 bid - LE Dopplers negative 12/23  3. Chronic heart failure with preserved ejection fraction  - BNP 03/06/22 was 1100.6 - This is mostly RV failure from [facility] - management as above. Volume status looks good   4. Severe COPD- - Follows with pulmonology Marcos Eke) - OnOx stil with hypoxemia. She was asked to increase night O2 2L-> 3L but couldn't tolerate due to HA. I asked her to try 2.5L.  - Refer to pulmonary rehab   5. Tobacco use- - stopped smoking 10/23 - had been smoking since the age of 18 - Remains quit  Arvilla Meres, MD  9:01 PM

## 2022-10-08 ENCOUNTER — Encounter: Payer: Self-pay | Admitting: Internal Medicine

## 2022-10-08 ENCOUNTER — Ambulatory Visit: Payer: Medicare Other | Attending: Internal Medicine | Admitting: Internal Medicine

## 2022-10-08 VITALS — BP 108/60 | HR 72 | Wt 123.6 lb

## 2022-10-08 DIAGNOSIS — I272 Pulmonary hypertension, unspecified: Secondary | ICD-10-CM

## 2022-10-08 DIAGNOSIS — I2782 Chronic pulmonary embolism: Secondary | ICD-10-CM

## 2022-10-08 DIAGNOSIS — Z01810 Encounter for preprocedural cardiovascular examination: Secondary | ICD-10-CM

## 2022-10-08 DIAGNOSIS — I5042 Chronic combined systolic (congestive) and diastolic (congestive) heart failure: Secondary | ICD-10-CM

## 2022-10-08 DIAGNOSIS — J432 Centrilobular emphysema: Secondary | ICD-10-CM

## 2022-10-08 MED ORDER — APIXABAN 2.5 MG PO TABS: 2.5000 mg | ORAL_TABLET | Freq: Two times a day (BID) | ORAL | 3 refills | Status: DC

## 2022-10-08 NOTE — Patient Instructions (Addendum)
Medication Changes:  Decrease Eliquis to 2.5 mg (1 tablet) two times a day.  Special Instructions // Education:  Do the following things EVERYDAY: Weigh yourself in the morning before breakfast. Write it down and keep it in a log. Take your medicines as prescribed Eat low salt foods--Limit salt (sodium) to 2000 mg per day.  Stay as active as you can everyday Limit all fluids for the day to less than 2 liters    Your physician has requested that you have an echocardiogram. Echocardiography is a painless test that uses sound waves to create images of your heart. It provides your doctor with information about the size and shape of your heart and how well your heart's chambers and valves are working. This procedure takes approximately one hour. There are no restrictions for this procedure. Please do NOT wear cologne, perfume, aftershave, or lotions (deodorant is allowed). Please arrive 15 minutes prior to your appointment time.   Follow-Up in: 6 month follow up with Dr. Gala Romney.  And ECHO.   If you have any questions or concerns before your next appointment please send [place] a message through [place] or call our office at [phone] Monday-Friday 8 am-5 pm.   If you have an urgent need after hours on the weekend please call your Primary Cardiologist or the Advanced Heart Failure Clinic in [place] at [phone].

## 2022-10-08 NOTE — Addendum Note (Signed)
Addended by: Electa Sniff on: 10/08/2022 03:49 PM   Modules accepted: Orders

## 2022-10-29 ENCOUNTER — Other Ambulatory Visit: Payer: Self-pay

## 2022-10-29 DIAGNOSIS — I5042 Chronic combined systolic (congestive) and diastolic (congestive) heart failure: Secondary | ICD-10-CM

## 2022-10-29 MED ORDER — SPIRONOLACTONE 25 MG PO TABS
ORAL_TABLET | ORAL | 3 refills | Status: DC
Start: 2022-10-29 — End: 2023-02-01

## 2022-10-29 NOTE — Telephone Encounter (Signed)
Patient called clinic for refill of Spironolactone. Medication refill placed to updated Pharmacy.

## 2022-11-21 ENCOUNTER — Encounter: Payer: Self-pay | Admitting: Anesthesiology

## 2022-11-21 ENCOUNTER — Encounter: Payer: Self-pay | Admitting: Ophthalmology

## 2022-11-21 NOTE — Anesthesia Preprocedure Evaluation (Deleted)
Anesthesia Evaluation    Airway        Dental   Pulmonary former smoker Severe pulmonary hypertension per echo: RVSP 57.6; 55 and over is generally considered "severe" pulmonary hypertension  Note from pulmonologist:   Likely due to chronic hypoxic with a constriction Vital maintain O2 sats above 88% Oxygen supplementation compliance reiterated    3. Chronic respiratory failure with hypoxia (HCC)  J96.11     Needs 3 L/min with ambulation Try 2.5 L/min at nighttime with deep due to persistent hypoxemia   4. Pulmonary embolism, other, unspecified chronicity, unspecified whether acute cor pulmonale present (HCC)  I26.99     Small chronic PE            Cardiovascular   03-06-22  1. Left ventricular ejection fraction, by estimation, is 60 to 65%. The  left ventricle has normal function. The left ventricle has no regional  wall motion abnormalities. Left ventricular diastolic parameters are  indeterminate.   2. Right ventricular systolic function is normal. The right ventricular  size is mildly enlarged. There is moderately elevated pulmonary artery  systolic pressure. The estimated right ventricular systolic pressure is  57.6 mmHg.   3. The mitral valve is normal in structure. Moderate mitral valve  regurgitation. No evidence of mitral stenosis.   4. Tricuspid valve regurgitation is moderate.   5. The aortic valve has an indeterminant number of cusps. Aortic valve  regurgitation is not visualized. No aortic stenosis is present.   6. The inferior vena cava is dilated in size with >50% respiratory  variability, suggesting right atrial pressure of 8 mmHg.     Neuro/Psych    GI/Hepatic   Endo/Other    Renal/GU      Musculoskeletal   Abdominal   Peds  Hematology   Anesthesia Other Findings Medical History  Chronic pain  Thoracic outlet syndrome Emphysema lung (HCC)  GERD (gastroesophageal reflux disease) CHF  (congestive heart failure) (HCC) Chronic kidney disease Pulmonary HTN (HCC)   Family history of adverse reaction to anesthesia--propofol to the son increases BP, heart rate and agitation    Reproductive/Obstetrics                              Anesthesia Physical Anesthesia Plan Anesthesia Quick Evaluation

## 2022-11-26 ENCOUNTER — Telehealth: Payer: Self-pay

## 2022-11-26 MED ORDER — FUROSEMIDE 40 MG PO TABS: ORAL_TABLET | ORAL | 3 refills | Status: DC

## 2022-11-26 NOTE — Telephone Encounter (Signed)
Pt called requesting refill for Lasix 40 mg tablet. Refilled and sent to pt choice of pharmacy.  [facility] - [place], [place] - [address]

## 2022-11-28 ENCOUNTER — Ambulatory Visit: Admission: RE | Admit: 2022-11-28 | Payer: Medicare Other | Source: Home / Self Care | Admitting: Ophthalmology

## 2022-11-28 HISTORY — DX: Dependence on supplemental oxygen: Z99.81

## 2022-11-28 HISTORY — DX: Personal history of pulmonary embolism: Z86.711

## 2022-11-28 HISTORY — DX: Rheumatic tricuspid insufficiency: I07.1

## 2022-11-28 HISTORY — DX: Chronic respiratory failure with hypoxia: J96.11

## 2022-11-28 HISTORY — DX: Family history of other specified conditions: Z84.89

## 2022-11-28 HISTORY — DX: Nonrheumatic mitral (valve) insufficiency: I34.0

## 2022-11-28 SURGERY — PHACOEMULSIFICATION, CATARACT, WITH IOL INSERTION
Anesthesia: Topical | Laterality: Left

## 2022-12-05 ENCOUNTER — Ambulatory Visit: Payer: Medicare Other | Admitting: Pulmonary Disease

## 2022-12-05 ENCOUNTER — Encounter: Payer: Self-pay | Admitting: Pulmonary Disease

## 2022-12-05 VITALS — BP 110/70 | HR 64 | Temp 98.3°F | Ht 63.0 in | Wt 122.6 lb

## 2022-12-05 DIAGNOSIS — I2693 Single subsegmental pulmonary embolism without acute cor pulmonale: Secondary | ICD-10-CM | POA: Diagnosis not present

## 2022-12-05 DIAGNOSIS — I272 Pulmonary hypertension, unspecified: Secondary | ICD-10-CM

## 2022-12-05 DIAGNOSIS — J439 Emphysema, unspecified: Secondary | ICD-10-CM | POA: Diagnosis not present

## 2022-12-05 DIAGNOSIS — J9611 Chronic respiratory failure with hypoxia: Secondary | ICD-10-CM

## 2022-12-05 DIAGNOSIS — Z01811 Encounter for preprocedural respiratory examination: Secondary | ICD-10-CM

## 2022-12-05 NOTE — Progress Notes (Signed)
Subjective:    Patient ID: Holly Werner, female    DOB: 09-12-1950, 72 y.o.   MRN: [id number]  Patient Care Team: Patrice Paradise, MD as PCP - General (Physician Assistant) Glory Buff, RN as Registered Nurse Salena Saner, MD as Consulting Physician (Pulmonary Disease) Bensimhon, Bevelyn Buckles, MD as Consulting Physician (Cardiology)  Chief Complaint  Patient presents with   Follow-up    DOE. No wheezing. Cough with clear sputum.    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  She previously followed with [facility] and established care here on 26 April 2022.  She was last seen by me on 16 August 2022, at that time pulmonary rehab was recommended, the patient declined.  She presents today for follow-up.  She presents with a portable concentrator set to 2 L/min continuous.  Recall that she has significant pulmonary hypertension likely on the basis of chronic hypoxic vasoconstriction due to her respiratory failure.  She notes with her oxygen supplementation but notes that occasionally she will "forget" to use the oxygen.  She is currently maintained on Stiolto 2 inhalations twice a day.  She does not use albuterol at all.  She quit smoking in October 2023 and has not relapsed.  She is on oxygen at 2-1/2 L/min nocturnally, claims compliance with the therapy and notes benefit.  She has not noticed any wheezing, she does have morning cough productive of clear sputum.  No hemoptysis.  She does not endorse any other symptomatology today.  She is to have cataract surgery.  The procedure is now scheduled to be done in [place].  The patient states that she will need "clearance".  This procedure is usually done under local anesthetic/mild sedative.  She is on prophylactic dose of Eliquis.   DATA 04/26/2022  Alpha 1: MM phenotype, 173 mg/dl level 78/29/5621 PFT: FEV1 2.07 L or 97% predicted, FVC 2.34 L or 82% of predicted, FEV1/FVC 89%, no bronchodilator response.  Lung volumes overall normal.  Overall no restriction, diffusion capacity severely impaired. 06/16/2022 sleep study: No evidence of sleep apnea, the patient did exhibit significant oxygen desaturations related to pulmonary disease. 06/28/2022 echocardiogram: LVEF 60 to 65%, moderately elevated pulmonary artery systolic pressure, RV systolic pressure calculated at 57.6 mmHg.  Moderate mitral valve regurgitation, moderate tricuspid valve regurgitation.  Review of Systems A 10 point review of systems was performed and it is as noted above otherwise negative.   Patient Active Problem List   Diagnosis Date Noted   Diastolic heart failure (HCC) 06/05/2022   Pulmonary embolism (HCC) 06/05/2022   Chronic respiratory failure with hypoxia (HCC) 06/05/2022   Tobacco abuse 03/07/2022   Swelling of lower extremity    Pulmonary hypertension, unspecified (HCC)    Chronic kidney disease, stage 3a (HCC) 03/06/2022   Acute heart failure (HCC) 03/06/2022   Acute respiratory failure with hypoxia (HCC) 03/06/2022   Cervical spine arthritis 12/05/2016   COPD (chronic obstructive pulmonary disease) (HCC) 12/05/2016   GERD (gastroesophageal reflux disease) 12/05/2016   IBS (irritable bowel syndrome) 12/05/2016   Nephrolithiasis 12/05/2016   Premature menopause 12/05/2016   Pulmonary nodule, right 12/05/2016   Appendicitis 12/05/2016   Acute appendicitis with localized peritonitis 12/05/2016   Acute appendicitis 12/03/2016   Personal history of tobacco use, presenting hazards to health 10/09/2016   Cervicalgia 10/28/2014  Myofacial muscle pain 10/28/2014   Neck pain 10/28/2014    Social History   Tobacco Use   Smoking status: Former    Packs/day: 0.75    Years: 51.00    Additional pack years: 0.00    Total pack years: 38.25    Types:  Cigarettes    Quit date: 03/06/2022    Years since quitting: 0.7   Smokeless tobacco: Never  Substance Use Topics   Alcohol use: No    Alcohol/week: 0.0 standard drinks of alcohol    Allergies  Allergen Reactions   Escitalopram Oxalate Palpitations    Loose stools   Amoxicillin-Pot Clavulanate     Other reaction(s): Vomiting   Aspirin Other (See Comments)    Stomach hurt   Codeine Other (See Comments)    stomach hurt   Doxycycline Swelling   Levofloxacin     Other reaction(s): Dizziness   Lipitor [Atorvastatin]     Eye pain    Current Meds  Medication Sig   acetaminophen (TYLENOL) 500 MG tablet Take 500 mg by mouth as needed.   albuterol (VENTOLIN HFA) 108 (90 Base) MCG/ACT inhaler Inhale 2 puffs into the lungs every 6 (six) hours as needed for wheezing or shortness of breath.   apixaban (ELIQUIS) 2.5 MG TABS tablet Take 1 tablet (2.5 mg total) by mouth 2 (two) times daily.   diazepam (VALIUM) 5 MG tablet Take 2.5 mg by mouth 2 (two) times daily as needed for anxiety.   furosemide (LASIX) 40 MG tablet TAKE 1 TABLET(40 MG) BY MOUTH DAILY   gabapentin (NEURONTIN) 100 MG capsule Take 100 mg by mouth 2 (two) times daily.   HYDROcodone-acetaminophen (NORCO/VICODIN) 5-325 MG tablet Take 1 tablet by mouth every 6 (six) hours as needed.   Multiple Vitamins-Iron (MULTIVITAMIN/IRON PO) Take 1 capsule by mouth daily.   OXYGEN Inhale 1.5-2 L into the lungs continuous.   spironolactone (ALDACTONE) 25 MG tablet TAKE 1/2 TABLET(12.5 MG) BY MOUTH DAILY   Tiotropium Bromide-Olodaterol (STIOLTO RESPIMAT) 2.5-2.5 MCG/ACT AERS Inhale 2 puffs into the lungs daily.     There is no immunization history on file for this patient.      Objective:     BP 110/70 (BP Location: Right Arm, Cuff Size: Normal)   Pulse 64   Temp 98.3 F (36.8 C)   Ht 5\' 3"  (1.6 m)   Wt 122 lb 9.6 oz (55.6 kg)   SpO2 96%   BMI 21.72 kg/m   SpO2: 96 % O2 Device: Nasal cannula O2 Flow Rate (L/min): 2  L/min O2 Type: Continuous O2  GENERAL: Thin well-developed woman, no acute distress, fully ambulatory.  No conversational dyspnea.  Using oxygen via nasal cannula. HEAD: Normocephalic, atraumatic.  EYES: Pupils equal, round, reactive to light.  No scleral icterus.  MOUTH: Oral mucosa moist.  No thrush.  Mallampati II airway NECK: Supple. No thyromegaly. Trachea midline. No JVD.  No adenopathy. PULMONARY: Good air entry bilaterally.  No adventitious sounds. CARDIOVASCULAR: S1 and S2. Regular rate and rhythm.  No rubs, murmurs or gallops heard. ABDOMEN: Benign. MUSCULOSKELETAL: No joint deformity, no clubbing, no edema.  NEUROLOGIC: No overt focal deficit, no gait disturbance, speech is fluent. SKIN: Intact,warm,dry. PSYCH: Mood and behavior normal.      Assessment & Plan:     ICD-10-CM   1. Pulmonary emphysema, unspecified emphysema type (HCC)  J43.9    Continue Stiolto 2 puffs daily Continue as needed albuterol Appears as compensated as she is to get  2. Pulmonary hypertension, unspecified (HCC)  I27.20    Likely due to chronic hypoxic vasoconstriction Continue supplemental O2    3. Chronic respiratory failure with hypoxia (HCC)  J96.11    Continue supplemental O2 2 L/min with activity and rest 2-1/2 L/min during sleep    4. Single subsegmental pulmonary embolism without acute cor pulmonale (HCC)  I26.93    Patient is on maintenance Eliquis 2.5 mg twice daily    5. Preoperative respiratory examination  Z01.811    Patient is as compensated as she is to get Procedure is low risk Avoid over sedation     From the cataract extraction standpoint, the patient is a mild to moderate risk.  She is as compensated as she is to get with regards to her respiratory status.  Avoid oversedation.    Will see the patient in follow-up in 1 month's time she is to contact us prior to that time should any new problems arise.   Gailen Shelter, MD Advanced Bronchoscopy P[facility] [place]  [facility]    *This note was dictated using voice recognition software/Dragon.  Despite best efforts to proofread, errors can occur which can change the meaning. Any transcriptional errors that result from this process are unintentional and may not be fully corrected at the time of dictation.

## 2022-12-05 NOTE — Patient Instructions (Signed)
I will write my note that it is okay for you to have cataract surgery.  Using her oxygen as prescribed.  We will see you in follow-up in 4 months time.  Call sooner should any new problems arise.

## 2022-12-10 ENCOUNTER — Ambulatory Visit: Admission: RE | Admit: 2022-12-10 | Payer: Medicare Other | Source: Home / Self Care | Admitting: Ophthalmology

## 2022-12-10 ENCOUNTER — Encounter: Admission: RE | Payer: Self-pay | Source: Home / Self Care

## 2022-12-10 SURGERY — PHACOEMULSIFICATION, CATARACT, WITH IOL INSERTION
Anesthesia: Topical | Laterality: Right

## 2023-01-31 ENCOUNTER — Other Ambulatory Visit: Payer: Self-pay | Admitting: Internal Medicine

## 2023-01-31 DIAGNOSIS — I5042 Chronic combined systolic (congestive) and diastolic (congestive) heart failure: Secondary | ICD-10-CM

## 2023-03-21 ENCOUNTER — Ambulatory Visit: Payer: Medicare Other | Attending: Cardiology | Admitting: Cardiology

## 2023-03-21 VITALS — BP 134/58 | HR 83 | Wt 124.0 lb

## 2023-03-21 DIAGNOSIS — J432 Centrilobular emphysema: Secondary | ICD-10-CM

## 2023-03-21 DIAGNOSIS — I5032 Chronic diastolic (congestive) heart failure: Secondary | ICD-10-CM | POA: Diagnosis not present

## 2023-03-21 DIAGNOSIS — I272 Pulmonary hypertension, unspecified: Secondary | ICD-10-CM

## 2023-03-21 NOTE — Progress Notes (Signed)
[facility] HF CLINIC NOTE  Referring Physician: Dr. Azucena Cecil Primary Care: Patrice Paradise, MD Primary Cardiologist: Dr. Azucena Cecil  HPI:  [name] is a 72 y/o female with a history of CKD, severe COPD (> 50 pack years), previous tobacco use. Diagnosed with diastolic HF and PAH in the hospital in 10/23. Referred for further evalation of PAH.    Admitted 03/06/22 due to several day history of shortness of breath on exertion and leg swelling. Sats in 80s on admit. Chest CTA negative for PE but does show severe emphysema and pulmonary HTN. Cardiology & Pulmonology consults obtained. Did need oxygen at discharge. IV lasix provided and transitioned to oral diuretics. Discharged after 3 days. Started lasix 40 daily and sildenafil 20 tid.   Echo 10/23 EF 60-65% G1DD. Flattened septum. RV markedly dilated. Moderately HK  RVSP 70-37mmHG  PH serologies + for RF all others negative  Seen in Pulmonary Clinic. (Dr. Tim Lair). CT angio 04/11/22. 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 Severe COPD.   Sleep study 06/20/22 AHI 3.9 lowest sat 83% (O2 increased to 3L at night)  Echo 06/28/22 EF 60-65% RV mildly dilated normal function  RVSP  Sleep study AHI 3.9 Sats as low as 83%   PFTs 05/17/22 FEV1 2.07 (97%) FVC 2.34 (82%) DLCO 18%  She presents today for an acute care visit. She reports having abdominal fullness and edema in her thighs that has slowly progressed for the past 3 months. Over the past 1 week, she feels that her heart is beating harder "I can feel my heart beating sometimes" and that she is once again becoming hypervolemic similar to her symptoms prior to hospitalization last year. No orthopnea or PND; dyspnea is otherwise at her baseline. Continues to wear 2-3L [place] most of the day. O2 sats at rest 92-95% with a quick drop into the 80s with mild exertion.    Past Medical History:  Diagnosis Date   CHF (congestive heart failure) (HCC)    Chronic hypoxic respiratory failure, on home  oxygen therapy (HCC)    Chronic kidney disease    Chronic pain    Emphysema lung (HCC)    Family history of adverse reaction to anesthesia    Son - Propofol - increased BP and HR, aggitation   GERD (gastroesophageal reflux disease)    Hx of pulmonary embolus    Moderate mitral valve regurgitation    Moderate tricuspid regurgitation by prior echocardiogram    Pulmonary HTN (HCC)    Thoracic outlet syndrome     Current Outpatient Medications  Medication Sig Dispense Refill   acetaminophen (TYLENOL) 500 MG tablet Take 500 mg by mouth as needed.     apixaban (ELIQUIS) 2.5 MG TABS tablet Take 1 tablet (2.5 mg total) by mouth 2 (two) times daily. 60 tablet 3   diazepam (VALIUM) 5 MG tablet Take 2.5 mg by mouth 2 (two) times daily as needed for anxiety.     furosemide (LASIX) 40 MG tablet TAKE 1 TABLET(40 MG) BY MOUTH DAILY 90 tablet 3   HYDROcodone-acetaminophen (NORCO/VICODIN) 5-325 MG tablet Take 1 tablet by mouth every 6 (six) hours as needed.     Multiple Vitamins-Iron (MULTIVITAMIN/IRON PO) Take 1 capsule by mouth daily.     OXYGEN Inhale 1.5-2 L into the lungs continuous.     spironolactone (ALDACTONE) 25 MG tablet TAKE 1/2 TABLET(12.5 MG) BY MOUTH DAILY 15 tablet 3   Tiotropium Bromide-Olodaterol (STIOLTO RESPIMAT) 2.5-2.5 MCG/ACT AERS Inhale 2 puffs into the lungs daily. 4  g 11   albuterol (VENTOLIN HFA) 108 (90 Base) MCG/ACT inhaler Inhale 2 puffs into the lungs every 6 (six) hours as needed for wheezing or shortness of breath. (Patient not taking: Reported on 03/21/2023) 8 g 2   gabapentin (NEURONTIN) 100 MG capsule Take 100 mg by mouth 2 (two) times daily. (Patient not taking: Reported on 03/21/2023)     No current facility-administered medications for this visit.    Allergies  Allergen Reactions   Escitalopram Oxalate Palpitations    Loose stools   Amoxicillin-Pot Clavulanate     Other reaction(s): Vomiting   Aspirin Other (See Comments)    Stomach hurt   Codeine Other  (See Comments)    stomach hurt   Doxycycline Swelling   Levofloxacin     Other reaction(s): Dizziness   Lipitor [Atorvastatin]     Eye pain      Social History   Socioeconomic History   Marital status: Widowed    Spouse name: Not on file   Number of children: Not on file   Years of education: Not on file   Highest education level: Not on file  Occupational History   Occupation: retired  Tobacco Use   Smoking status: Former    Current packs/day: 0.00    Average packs/day: 0.8 packs/day for 51.0 years (38.3 ttl pk-yrs)    Types: Cigarettes    Start date: 03/07/1971    Quit date: 03/06/2022    Years since quitting: 1.0   Smokeless tobacco: Never  Vaping Use   Vaping status: Never Used  Substance and Sexual Activity   Alcohol use: No    Alcohol/week: 0.0 standard drinks of alcohol   Drug use: No   Sexual activity: Not on file  Other Topics Concern   Not on file  Social History Narrative   Not on file   Social Determinants of Health   Financial Resource Strain: Low Risk  (09/25/2022)   Received from [facility], [facility]   Overall Financial Resource Strain (CARDIA)    Difficulty of Paying Living Expenses: Not hard at all  Food Insecurity: No Food Insecurity (09/25/2022)   Received from [facility], [facility]   Hunger Vital Sign    Worried About Running Out of Food in the Last Year: Never true    Ran Out of Food in the Last Year: Never true  Transportation Needs: No Transportation Needs (09/25/2022)   Received from [facility], [facility]   [facility]    In the past 12 months, has lack of transportation kept you from medical appointments or from getting medications?: No    Lack of Transportation (Non-Medical): No  Physical Activity: Not on file  Stress: Not on file  Social Connections: Not on file  Intimate Partner Violence: Not At Risk  (03/07/2022)   Humiliation, Afraid, Rape, and Kick questionnaire    Fear of Current or Ex-Partner: No    Emotionally Abused: No    Physically Abused: No    Sexually Abused: No      Family History  Problem Relation Age of Onset   Vision loss Mother    Macular degeneration Mother    Dementia Mother    Kidney disease Father    COPD Sister    Uterine cancer Sister    Lung cancer Maternal Aunt    COPD Sister    Colon cancer Maternal Aunt    Ovarian cancer Maternal  Aunt      PHYSICAL EXAM: Vitals:   03/21/23 1312  BP: (!) 134/58  Pulse: 83  SpO2: 90%   GENERAL: Well nourished, well developed, and in no apparent distress at rest.  HEENT: Negative for arcus senilis or xanthelasma. There is no scleral icterus.  The mucous membranes are pink and moist.   NECK: Supple, No masses. Normal carotid upstrokes without bruits. No masses or thyromegaly.    CHEST: There are no chest wall deformities. There is no chest wall tenderness. Respirations are unlabored.  Lungs- coarse lung sounds; decreased b/l; no wheezing.  CARDIAC:  JVP: 6 with small V waves cm          Normal rate with regular rhythm. No murmurs, rubs or gallops.  Pulses are 2+ and symmetrical in upper and lower extremities. No edema.  ABDOMEN: Soft, non-tender, non-distended. There are no masses or hepatomegaly. There are normal bowel sounds.  EXTREMITIES: Warm and well perfused with no cyanosis, clubbing.  LYMPHATIC: No axillary or supraclavicular lymphadenopathy.  NEUROLOGIC: Patient is oriented x3 with no focal or lateralizing neurologic deficits.  PSYCH: Patients affect is appropriate, there is no evidence of anxiety or depression.  SKIN: Warm and dry; no lesions or wounds.    ASSESSMENT & PLAN:   1. PAH with cor pulmonale - Echo 10/23 EF 60-65% G1DD. Flattened septum. RV markedly dilated. Moderately HK  RVSP 70-71mmHG - CT chest 10/23. No PE. Very severe emphysema - CT angio 04/11/22. 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 Severe  COPD.  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 In the process of getting a portable tank/compressor. - PFTs with normal spirometry with markedly reduced DLCO (18%) - unclear to me how spirometry is normal based on the amount of parenchymal lung disease on CT - Auto-immune serologies negative x for + RF (non-specific). CCP negative - Echo 06/28/22 EF 60-65% RV mildly dilated normal function (much improved from previous) RVSP 58 - Although she appears euvolemic by JVP and LE edema, she does have some mild abdominal protuberance; in the setting of cor pulmonale will increase lasix to 40mg  BID today; if symptoms resolve will go back to 40mg  daily tomorrow. Otherwise I have explained lasix titration based on daily weights to her.  - REDS 23%, however, possibly falsely low in the setting of PAH.  - Plan for TTE next week; if there is progression of RV failure with adjust treatment plan accordingly.  - Reviewed labs from yesterday; normal sCr.   2, Small RUL PE  - reviewed with IR. Suspect small PE but cannot exclude angiosarcoma (less likely) - F/u CT 2/24 partial recannalization of small PE - likely not big enough to affect PAH - Can drop apixaban to 2.5 bid per Ampilfy-EXT trial - LE Dopplers negative 12/23  3. Chronic heart failure with preserved ejection fraction  - BNP 03/06/22 was 1100.6 - This is mostly RV failure from [facility] - see above. Mildly hypervolemic to euvolemic.    4. Severe COPD- - Follows with pulmonology Marcos Eke) - Remains on 2-3LNC - Discussed importance of starting pulmonary rehab.   5. Tobacco use- - stopped smoking 10/23 - had been smoking since the age of 88 - Reports that she has been compliant with smoking cessation.   I spent 35 minutes caring for  this patient today including face to  face time, ordering and reviewing labs, seeing the patient, documenting in the record, and arranging follow ups.   Annetta Deiss, DO  1:31 PM

## 2023-03-21 NOTE — Patient Instructions (Signed)
Take 40mg  of lasix today. You may take 60mg  of lasix tomorrow if you are still short of breath.  Do the following things EVERYDAY: Weigh yourself in the morning before breakfast. Write it down and keep it in a log. Take your medicines as prescribed Eat low salt foods--Limit salt (sodium) to 2000 mg per day.  Stay as active as you can everyday Limit all fluids for the day to less than 2 liters

## 2023-03-26 ENCOUNTER — Ambulatory Visit
Admission: RE | Admit: 2023-03-26 | Discharge: 2023-03-26 | Disposition: A | Discharge status: Home / Self Care | Payer: Medicare Other | Source: Ambulatory Visit | Attending: Internal Medicine | Admitting: Internal Medicine

## 2023-03-26 DIAGNOSIS — N189 Chronic kidney disease, unspecified: Secondary | ICD-10-CM | POA: Diagnosis not present

## 2023-03-26 DIAGNOSIS — I5042 Chronic combined systolic (congestive) and diastolic (congestive) heart failure: Secondary | ICD-10-CM | POA: Insufficient documentation

## 2023-03-26 DIAGNOSIS — I081 Rheumatic disorders of both mitral and tricuspid valves: Secondary | ICD-10-CM | POA: Diagnosis not present

## 2023-03-26 LAB — ECHOCARDIOGRAM COMPLETE
AR max vel: 3.12 cm2
AV Area VTI: 3.11 cm2
AV Area mean vel: 2.94 cm2
AV Mean grad: 3 mm[Hg]
AV Peak grad: 5.7 mm[Hg]
Ao pk vel: 1.19 m/s
Area-P 1/2: 3.23 cm2
MV VTI: 3.2 cm2
S' Lateral: 2.6 cm

## 2023-03-26 NOTE — Progress Notes (Signed)
*  PRELIMINARY RESULTS* Echocardiogram 2D Echocardiogram has been performed.  Holly Werner 03/26/2023, 10:38 AM

## 2023-04-01 ENCOUNTER — Ambulatory Visit: Payer: Medicare Other | Attending: Internal Medicine | Admitting: Internal Medicine

## 2023-04-01 VITALS — BP 118/63 | HR 70 | Wt 124.0 lb

## 2023-04-01 DIAGNOSIS — I272 Pulmonary hypertension, unspecified: Secondary | ICD-10-CM

## 2023-04-01 DIAGNOSIS — Z720 Tobacco use: Secondary | ICD-10-CM

## 2023-04-01 DIAGNOSIS — I5032 Chronic diastolic (congestive) heart failure: Secondary | ICD-10-CM | POA: Diagnosis not present

## 2023-04-01 NOTE — Patient Instructions (Signed)
Do the following things EVERYDAY: Weigh yourself in the morning before breakfast. Write it down and keep it in a log. Take your medicines as prescribed Eat low salt foods--Limit salt (sodium) to 2000 mg per day.  Stay as active as you can everyday Limit all fluids for the day to less than 2 liters  Please call in January to schedule your February appointment with Dr. Gala Romney.

## 2023-04-01 NOTE — Progress Notes (Signed)
[facility] HF CLINIC NOTE  Referring Physician: Dr. Azucena Cecil Primary Care: Patrice Paradise, MD Primary Cardiologist: Dr. Azucena Cecil  HPI:  Holly Werner is a 72 y/o female with a history of CKD, severe COPD (> 50 pack years), previous tobacco use. Diagnosed with diastolic HF and PAH in the hospital in 10/23. Referred for further evalation of PAH.    Admitted 03/06/22 due to several day history of shortness of breath on exertion and leg swelling. Sats in 80s on admit. Chest CTA negative for PE but does show severe emphysema and pulmonary HTN. Cardiology & Pulmonology consults obtained. Did need oxygen at discharge. IV lasix provided and transitioned to oral diuretics. Discharged after 3 days. Started lasix 40 daily and sildenafil 20 tid.   Echo 10/23 EF 60-65% G1DD. Flattened septum. RV markedly dilated. Moderately HK  RVSP 70-79mmHG  PH serologies + for RF all others negative  Seen in Pulmonary Clinic. (Dr. Tim Lair). CT angio 04/11/22. 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 Severe COPD.   Sleep study 06/20/22 AHI 3.9 lowest sat 83% (O2 increased to 3L at night)  Echo 06/28/22 EF 60-65% RV mildly dilated normal function  RVSP  Sleep study AHI 3.9 Sats as low as 83%   PFTs 05/17/22 FEV1 2.07 (97%) FVC 2.34 (82%) DLCO 18%  Was seen for an add-on visit last week by Dr. Gasper Lloyd. Was feeling fatigued and bloated. ReDS 23% but appeared bloated on exam. Took extra lasix and lost 4 pounds. She is here for f/u with her daughter. Feels pretty good. Feels like she tires more easily. Ab bloating is better. Has ab pain after eating   Echo 03/26/23: EF 55-60% RV ok TR insufficient to estimate RVSP. Aortic sclerosis   Past Medical History:  Diagnosis Date   CHF (congestive heart failure) (HCC)    Chronic hypoxic respiratory failure, on home oxygen therapy (HCC)    Chronic kidney disease    Chronic pain    Emphysema lung (HCC)    Family history of adverse reaction to anesthesia    Son -  Propofol - increased BP and HR, aggitation   GERD (gastroesophageal reflux disease)    Hx of pulmonary embolus    Moderate mitral valve regurgitation    Moderate tricuspid regurgitation by prior echocardiogram    Pulmonary HTN (HCC)    Thoracic outlet syndrome     Current Outpatient Medications  Medication Sig Dispense Refill   acetaminophen (TYLENOL) 500 MG tablet Take 500 mg by mouth as needed.     apixaban (ELIQUIS) 2.5 MG TABS tablet Take 1 tablet (2.5 mg total) by mouth 2 (two) times daily. 60 tablet 3   diazepam (VALIUM) 5 MG tablet Take 2.5 mg by mouth 2 (two) times daily as needed for anxiety.     furosemide (LASIX) 40 MG tablet TAKE 1 TABLET(40 MG) BY MOUTH DAILY 90 tablet 3   HYDROcodone-acetaminophen (NORCO/VICODIN) 5-325 MG tablet Take 1 tablet by mouth every 6 (six) hours as needed.     Multiple Vitamins-Iron (MULTIVITAMIN/IRON PO) Take 1 capsule by mouth daily.     OXYGEN Inhale 1.5-2 L into the lungs continuous.     sertraline (ZOLOFT) 25 MG tablet Take 1 tablet by mouth daily.     spironolactone (ALDACTONE) 25 MG tablet TAKE 1/2 TABLET(12.5 MG) BY MOUTH DAILY 15 tablet 3   Tiotropium Bromide-Olodaterol (STIOLTO RESPIMAT) 2.5-2.5 MCG/ACT AERS Inhale 2 puffs into the lungs daily. 4 g 11   albuterol (VENTOLIN HFA) 108 (90 Base) MCG/ACT inhaler Inhale  2 puffs into the lungs every 6 (six) hours as needed for wheezing or shortness of breath. (Patient not taking: Reported on 03/21/2023) 8 g 2   gabapentin (NEURONTIN) 100 MG capsule Take 100 mg by mouth 2 (two) times daily. (Patient not taking: Reported on 03/21/2023)     No current facility-administered medications for this visit.    Allergies  Allergen Reactions   Escitalopram Oxalate Palpitations    Loose stools   Amoxicillin-Pot Clavulanate     Other reaction(s): Vomiting   Aspirin Other (See Comments)    Stomach hurt   Codeine Other (See Comments)    stomach hurt   Doxycycline Swelling   Levofloxacin     Other  reaction(s): Dizziness   Lipitor [Atorvastatin]     Eye pain      Social History   Socioeconomic History   Marital status: Widowed    Spouse name: Not on file   Number of children: Not on file   Years of education: Not on file   Highest education level: Not on file  Occupational History   Occupation: retired  Tobacco Use   Smoking status: Former    Current packs/day: 0.00    Average packs/day: 0.8 packs/day for 51.0 years (38.3 ttl pk-yrs)    Types: Cigarettes    Start date: 03/07/1971    Quit date: 03/06/2022    Years since quitting: 1.0   Smokeless tobacco: Never  Vaping Use   Vaping status: Never Used  Substance and Sexual Activity   Alcohol use: No    Alcohol/week: 0.0 standard drinks of alcohol   Drug use: No   Sexual activity: Not on file  Other Topics Concern   Not on file  Social History Narrative   Not on file   Social Determinants of Health   Financial Resource Strain: Low Risk  (03/27/2023)   Received from [facility]   Overall Financial Resource Strain (CARDIA)    Difficulty of Paying Living Expenses: Not hard at all  Food Insecurity: No Food Insecurity (03/27/2023)   Received from [facility]   Hunger Vital Sign    Worried About Running Out of Food in the Last Year: Never true    Ran Out of Food in the Last Year: Never true  Transportation Needs: No Transportation Needs (03/27/2023)   Received from [facility] - Transportation    In the past 12 months, has lack of transportation kept you from medical appointments or from getting medications?: No    Lack of Transportation (Non-Medical): No  Physical Activity: Not on file  Stress: Not on file  Social Connections: Not on file  Intimate Partner Violence: Not At Risk (03/07/2022)   Humiliation, Afraid, Rape, and Kick questionnaire    Fear of Current or Ex-Partner: No    Emotionally Abused: No    Physically Abused: No    Sexually  Abused: No      Family History  Problem Relation Age of Onset   Vision loss Mother    Macular degeneration Mother    Dementia Mother    Kidney disease Father    COPD Sister    Uterine cancer Sister    Lung cancer Maternal Aunt    COPD Sister    Colon cancer Maternal Aunt    Ovarian cancer Maternal Aunt      PHYSICAL EXAM: Vitals:   04/01/23 1405 04/01/23 1408  BP: 118/63   Pulse: 71 70  SpO2:  95% 98%   Wt Readings from Last 3 Encounters:  04/01/23 124 lb (56.2 kg)  03/21/23 124 lb (56.2 kg)  12/05/22 122 lb 9.6 oz (55.6 kg)    General:  Thin Well appearing. On O2 No resp difficulty HEENT: normal Neck: supple. no JVD. Carotids 2+ bilat; no bruits. No lymphadenopathy or thryomegaly appreciated. Cor: PMI nondisplaced. Regular rate & rhythm. No rubs, gallops or murmurs. Lungs: clear decreased throughout  Abdomen: soft, nontender, nondistended. No hepatosplenomegaly. No bruits or masses. Good bowel sounds. Extremities: no cyanosis, clubbing, rash, edema Neuro: alert & orientedx3, cranial nerves grossly intact. moves all 4 extremities w/o difficulty. Affect pleasant  ASSESSMENT & PLAN:   1. PAH with cor pulmonale - Echo 10/23 EF 60-65% G1DD. Flattened septum. RV markedly dilated. Moderately HK  RVSP 70-29mmHG - CT chest 10/23. No PE. Very severe emphysema - CT angio 04/11/22. 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 Severe COPD.  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 In the process of getting a portable tank/compressor. - PFTs with normal spirometry with markedly reduced DLCO (18%) - unclear to me how spirometry is normal based on the amount of parenchymal lung disease on CT - Auto-immune serologies negative x for + RF (non-specific). CCP negative - Echo 06/28/22 EF 60-65% RV mildly dilated normal function  (much improved from previous) RVSP 58 - Echo 03/26/23: EF 55-60% RV ok TR insufficient to estimate RVSP. Aortic sclerosis Reviewed personally with her and her daughter - RV looks much better on echo. Volume status looks good.  - Given severity of lung disease not candidate for selective pulmonary vasodilators due to risk for shunt  2, Small RUL PE  - reviewed with IR. Suspect small PE but cannot exclude angiosarcoma (less likely) - F/u CT 2/24 partial recannalization of small PE - likely not big enough to affect PAH - On apixaban 2.5 bid per Ampilfy-EXT trial. No bleeding  - LE Dopplers negative 12/23  3. Chronic heart failure with preserved ejection fraction  - BNP 03/06/22 was 1100.6 - This is mostly RV failure from [facility] - Volume looks good    4. Severe COPD- - Follows with pulmonology Marcos Eke) - Remains on 2-3LNC - Discussed importance of starting pulmonary rehab.   5. Tobacco use- - stopped smoking 10/23 - had been smoking since the age of 76 - Reports that she has been compliant with smoking cessation.   6. Ab pain - initially I was concerned for intestinal angina/mesenteric ischemia but I reviewed previous ab CTs and no evidence of significant atherosclerotic disease in major mesenteric vessels  Arvilla Meres, MD  2:39 PM

## 2023-04-08 ENCOUNTER — Other Ambulatory Visit: Payer: Self-pay | Admitting: Pulmonary Disease

## 2023-04-23 ENCOUNTER — Other Ambulatory Visit (HOSPITAL_COMMUNITY): Payer: Self-pay | Admitting: Cardiology

## 2023-04-23 MED ORDER — APIXABAN 2.5 MG PO TABS: 2.5000 mg | ORAL_TABLET | Freq: Two times a day (BID) | ORAL | 3 refills | Status: DC

## 2023-05-09 ENCOUNTER — Telehealth: Payer: Self-pay

## 2023-05-09 NOTE — Telephone Encounter (Signed)
  ADVANCED HEART FAILURE CLINIC   Pre-operative Risk Assessment   HEARTCARE STAFF-IMPORTANT INSTRUCTIONS 1 Red and Blue Text will auto delete once note is signed or closed. 2 Press F2 to navigate through template.   3 On drop down lists, L click to select >> R click to activate next field 4 Reason for Visit format is IMPORTANT!!  See Directions on No. 2 below. 5 Please review chart to determine if there is already a clearance note open for this procedure!!  DO NOT duplicate if a note already exists!!    :1}      Request for Surgical Clearance    Procedure:   Cervical Epidural Steroid injection  Date of Surgery:  Clearance 05/14/23                                 Surgeon:   Surgeon's Group or Practice Name:  [facility] Phone number:  [phone] Fax number:  [phone]   Type of Clearance Requested:   - Pharmacy:  Hold Apixaban (Eliquis) 72 hours prior to procedure    Type of Anesthesia:  Not Indicated   Additional requests/questions:  Please fax a copy of surgical clearance  to the surgeon's office.  Signed, Linda Hedges   05/09/2023, 3:52 PM  Holly Werner [facility] [address] [facility] [place] [address] [phone] (office) [phone] (fax)

## 2023-05-10 NOTE — Telephone Encounter (Signed)
 Surgical clearance form faxed via Epic

## 2023-05-14 ENCOUNTER — Ambulatory Visit: Payer: Medicare Other | Admitting: Pulmonary Disease

## 2023-05-14 ENCOUNTER — Ambulatory Visit
Admission: RE | Admit: 2023-05-14 | Discharge: 2023-05-14 | Disposition: A | Discharge status: Home / Self Care | Payer: Medicare Other | Source: Ambulatory Visit | Attending: Pulmonary Disease | Admitting: Pulmonary Disease

## 2023-05-14 ENCOUNTER — Encounter: Payer: Self-pay | Admitting: Pulmonary Disease

## 2023-05-14 VITALS — BP 110/60 | HR 60 | Temp 97.1°F | Ht 63.0 in | Wt 123.8 lb

## 2023-05-14 DIAGNOSIS — R0602 Shortness of breath: Secondary | ICD-10-CM

## 2023-05-14 DIAGNOSIS — J439 Emphysema, unspecified: Secondary | ICD-10-CM

## 2023-05-14 DIAGNOSIS — I272 Pulmonary hypertension, unspecified: Secondary | ICD-10-CM

## 2023-05-14 DIAGNOSIS — J9611 Chronic respiratory failure with hypoxia: Secondary | ICD-10-CM

## 2023-05-14 DIAGNOSIS — R490 Dysphonia: Secondary | ICD-10-CM

## 2023-05-14 MED ORDER — BREZTRI AEROSPHERE 160-9-4.8 MCG/ACT IN AERO
2.0000 | INHALATION_SPRAY | Freq: Two times a day (BID) | RESPIRATORY_TRACT | 0 refills | Status: DC
Start: 2023-05-14 — End: 2023-06-04

## 2023-05-14 MED ORDER — BREZTRI AEROSPHERE 160-9-4.8 MCG/ACT IN AERO: 2.0000 | INHALATION_SPRAY | Freq: Two times a day (BID) | RESPIRATORY_TRACT | 11 refills | Status: DC

## 2023-05-14 NOTE — Patient Instructions (Signed)
We are getting a chest x-ray done today.  We will call you with the results.  We are switching your inhaler to Breztri 2 puffs twice a day.  Make sure you rinse your mouth well after you use it.  Do not use the Stiolto anymore.  You indicated that you do not like using rescue inhalers.  We will see you in follow-up in 4 to 6 weeks time call sooner should any new problems arise.

## 2023-05-14 NOTE — Progress Notes (Unsigned)
Subjective:    Patient ID: Holly Werner, female    DOB: 02-Mar-1951, 72 y.o.   MRN: [id number]  Patient Care Team: Patrice Paradise, MD as PCP - General (Physician Assistant) Glory Buff, RN as Registered Nurse Salena Saner, MD as Consulting Physician (Pulmonary Disease) Bensimhon, Bevelyn Buckles, MD as Consulting Physician (Cardiology)  Chief Complaint  Patient presents with   Follow-up    DOE. Hoarseness. Cough with clear sputum. Wheezing with drainage.    BACKGROUND/INTERVAL:  HPI    Review of Systems A 10 point review of systems was performed and it is as noted above otherwise negative.   Patient Active Problem List   Diagnosis Date Noted   Diastolic heart failure (HCC) 06/05/2022   Pulmonary embolism (HCC) 06/05/2022   Chronic respiratory failure with hypoxia (HCC) 06/05/2022   Tobacco abuse 03/07/2022   Swelling of lower extremity    Pulmonary hypertension, unspecified (HCC)    Chronic kidney disease, stage 3a (HCC) 03/06/2022   Acute heart failure (HCC) 03/06/2022   Acute respiratory failure with hypoxia (HCC) 03/06/2022   Cervical spine arthritis 12/05/2016   COPD (chronic obstructive pulmonary disease) (HCC) 12/05/2016   GERD (gastroesophageal reflux disease) 12/05/2016   IBS (irritable bowel syndrome) 12/05/2016   Nephrolithiasis 12/05/2016   Premature menopause 12/05/2016   Pulmonary nodule, right 12/05/2016   Appendicitis 12/05/2016   Acute appendicitis with localized peritonitis 12/05/2016   Acute appendicitis 12/03/2016   Personal history of tobacco use, presenting hazards to health 10/09/2016   Cervicalgia 10/28/2014   Myofascial muscle pain 10/28/2014   Neck pain 10/28/2014    Social History   Tobacco Use   Smoking status: Former    Current packs/day: 0.00    Average packs/day: 0.8 packs/day for 51.0 years (38.3 ttl pk-yrs)    Types: Cigarettes    Start date: 03/07/1971    Quit date: 03/06/2022    Years since quitting: 1.1    Smokeless tobacco: Never  Substance Use Topics   Alcohol use: No    Alcohol/week: 0.0 standard drinks of alcohol    Allergies  Allergen Reactions   Escitalopram Oxalate Palpitations    Loose stools   Amoxicillin-Pot Clavulanate     Other reaction(s): Vomiting   Aspirin Other (See Comments)    Stomach hurt   Codeine Other (See Comments)    stomach hurt   Doxycycline Swelling   Levofloxacin     Other reaction(s): Dizziness   Lipitor [Atorvastatin]     Eye pain    No outpatient medications have been marked as taking for the 05/14/23 encounter (Office Visit) with Salena Saner, MD.     There is no immunization history on file for this patient.      Objective:     BP 110/60 (BP Location: Right Arm, Cuff Size: Normal)   Pulse 60   Temp (!) 97.1 F (36.2 C)   Ht 5\' 3"  (1.6 m)   Wt 123 lb 12.8 oz (56.2 kg)   SpO2 94%   BMI 21.93 kg/m   SpO2: 94 % O2 Device: Nasal cannula O2 Flow Rate (L/min): 2 L/min O2 Type: Continuous O2  GENERAL: HEAD: Normocephalic, atraumatic.  EYES: Pupils equal, round, reactive to light.  No scleral icterus.  MOUTH:  NECK: Supple. No thyromegaly. Trachea midline. No JVD.  No adenopathy. PULMONARY: Good air entry bilaterally.  No adventitious sounds. CARDIOVASCULAR: S1 and S2. Regular rate and rhythm.  ABDOMEN: MUSCULOSKELETAL: No joint deformity, no clubbing, no edema.  NEUROLOGIC:  SKIN: Intact,warm,dry. PSYCH:        Assessment & Plan:   No diagnosis found.  No orders of the defined types were placed in this encounter.   No orders of the defined types were placed in this encounter.      Advised if symptoms do not improve or worsen, to please contact office for sooner follow up or seek emergency care.    I spent xxx minutes of dedicated to the care of this patient on the date of this encounter to include pre-visit review of records, face-to-face time with the patient discussing conditions above, post visit ordering  of testing, clinical documentation with the electronic health record, making appropriate referrals as documented, and communicating necessary findings to members of the patients care team.     C. Danice Goltz, MD Advanced Bronchoscopy PCCM [facility]    *This note was generated using voice recognition software/Dragon and/or AI transcription program.  Despite best efforts to proofread, errors can occur which can change the meaning. Any transcriptional errors that result from this process are unintentional and may not be fully corrected at the time of dictation.

## 2023-06-04 ENCOUNTER — Encounter: Payer: Self-pay | Admitting: Pulmonary Disease

## 2023-06-04 MED ORDER — STIOLTO RESPIMAT 2.5-2.5 MCG/ACT IN AERS: 2.0000 | INHALATION_SPRAY | Freq: Every day | RESPIRATORY_TRACT | 11 refills | Status: DC

## 2023-06-04 NOTE — Telephone Encounter (Signed)
 I sent a prescription for Stiolto to her pharmacy.  She can discontinue the [place].

## 2023-06-07 NOTE — Telephone Encounter (Signed)
 If she is doing fine she can have a 51-month follow-up.

## 2023-06-27 ENCOUNTER — Ambulatory Visit: Payer: Medicare Other | Admitting: Pulmonary Disease

## 2023-07-04 ENCOUNTER — Other Ambulatory Visit: Payer: Self-pay

## 2023-07-04 ENCOUNTER — Telehealth: Payer: Self-pay | Admitting: Internal Medicine

## 2023-07-04 DIAGNOSIS — I5042 Chronic combined systolic (congestive) and diastolic (congestive) heart failure: Secondary | ICD-10-CM

## 2023-07-04 MED ORDER — SPIRONOLACTONE 25 MG PO TABS
12.5000 mg | ORAL_TABLET | Freq: Every day | ORAL | 3 refills | Status: DC
Start: 2023-07-04 — End: 2023-07-05

## 2023-07-05 ENCOUNTER — Other Ambulatory Visit: Payer: Self-pay

## 2023-07-05 DIAGNOSIS — I5042 Chronic combined systolic (congestive) and diastolic (congestive) heart failure: Secondary | ICD-10-CM

## 2023-07-05 MED ORDER — SPIRONOLACTONE 25 MG PO TABS
12.5000 mg | ORAL_TABLET | Freq: Every day | ORAL | 3 refills | Status: AC
Start: 2023-07-05 — End: 2024-07-04

## 2023-07-05 NOTE — Telephone Encounter (Signed)
 Updated Rx sent

## 2023-07-24 ENCOUNTER — Other Ambulatory Visit (HOSPITAL_COMMUNITY): Payer: Self-pay | Admitting: Internal Medicine

## 2023-07-25 ENCOUNTER — Telehealth: Payer: Self-pay | Admitting: Internal Medicine

## 2023-07-25 NOTE — Telephone Encounter (Signed)
 Pt confirmed appt for 07/26/23

## 2023-07-26 ENCOUNTER — Ambulatory Visit: Payer: Medicare Other | Attending: Internal Medicine | Admitting: Internal Medicine

## 2023-07-26 VITALS — BP 116/60 | HR 76 | Wt 123.4 lb

## 2023-07-26 DIAGNOSIS — Z720 Tobacco use: Secondary | ICD-10-CM

## 2023-07-26 DIAGNOSIS — I5032 Chronic diastolic (congestive) heart failure: Secondary | ICD-10-CM

## 2023-07-26 DIAGNOSIS — I272 Pulmonary hypertension, unspecified: Secondary | ICD-10-CM

## 2023-07-26 DIAGNOSIS — J432 Centrilobular emphysema: Secondary | ICD-10-CM | POA: Diagnosis not present

## 2023-07-26 DIAGNOSIS — I714 Abdominal aortic aneurysm, without rupture, unspecified: Secondary | ICD-10-CM

## 2023-07-26 NOTE — Progress Notes (Signed)
 [facility] HF CLINIC NOTE  Referring Physician: Dr. Azucena Cecil Primary Care: Patrice Paradise, MD Primary Cardiologist: Dr. Azucena Cecil  HPI:  Ms Ziesmer is a 73 y/o female with a history of CKD, severe COPD (> 50 pack years), previous tobacco use. Diagnosed with diastolic HF and PAH in the hospital in 10/23. Referred for further evalation of PAH.    Admitted 03/06/22 due to several day history of shortness of breath on exertion and leg swelling. Sats in 80s on admit. Chest CTA negative for PE but does show severe emphysema and pulmonary HTN. Cardiology & Pulmonology consults obtained. Did need oxygen at discharge. IV lasix provided and transitioned to oral diuretics. Discharged after 3 days. Started lasix 40 daily and sildenafil 20 tid.   Echo 10/23 EF 60-65% G1DD. Flattened septum. RV markedly dilated. Moderately HK  RVSP 70-1mmHG  PH serologies + for RF all others negative  Seen in Pulmonary Clinic. (Dr. Tim Lair). CT angio 04/11/22. 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 Severe COPD.   Sleep study 06/20/22 AHI 3.9 lowest sat 83% (O2 increased to 3L at night)  Echo 06/28/22 EF 60-65% RV mildly dilated normal function  RVSP  Sleep study AHI 3.9 Sats as low as 83%   PFTs 05/17/22 FEV1 2.07 (97%) FVC 2.34 (82%) DLCO 18%  Echo 03/26/23: EF 55-60% RV ok TR insufficient to estimate RVSP. Aortic sclerosis   Here with her daughter for routine f/u Overall doing well.  Found to have small AAA (2.9cm) on incidental scanning. Denies CP or SOB. Wearing her O2 with activities. No edema, orthopnea or syncope.    Past Medical History:  Diagnosis Date   CHF (congestive heart failure) (HCC)    Chronic hypoxic respiratory failure, on home oxygen therapy (HCC)    Chronic kidney disease    Chronic pain    Emphysema lung (HCC)    Family history of adverse reaction to anesthesia    Son - Propofol - increased BP and HR, aggitation   GERD (gastroesophageal reflux disease)    Hx of pulmonary embolus     Moderate mitral valve regurgitation    Moderate tricuspid regurgitation by prior echocardiogram    Pulmonary HTN (HCC)    Thoracic outlet syndrome     Current Outpatient Medications  Medication Sig Dispense Refill   acetaminophen (TYLENOL) 500 MG tablet Take 500 mg by mouth as needed.     diazepam (VALIUM) 5 MG tablet Take 2.5 mg by mouth 2 (two) times daily as needed for anxiety.     ELIQUIS 2.5 MG TABS tablet TAKE 1 TABLET(2.5 MG) BY MOUTH TWICE DAILY 60 tablet 3   furosemide (LASIX) 40 MG tablet TAKE 1 TABLET(40 MG) BY MOUTH DAILY 90 tablet 3   HYDROcodone-acetaminophen (NORCO/VICODIN) 5-325 MG tablet Take 1 tablet by mouth every 6 (six) hours as needed.     Multiple Vitamins-Iron (MULTIVITAMIN/IRON PO) Take 1 capsule by mouth daily.     OXYGEN Inhale 1.5-2 L into the lungs continuous.     spironolactone (ALDACTONE) 25 MG tablet Take 0.5 tablets (12.5 mg total) by mouth daily. 45 tablet 3   Tiotropium Bromide-Olodaterol (STIOLTO RESPIMAT) 2.5-2.5 MCG/ACT AERS Inhale 2 puffs into the lungs daily. 4 g 11   No current facility-administered medications for this visit.    Allergies  Allergen Reactions   Escitalopram Oxalate Palpitations    Loose stools   Amoxicillin-Pot Clavulanate     Other reaction(s): Vomiting   Aspirin Other (See Comments)    Stomach hurt  Codeine Other (See Comments)    stomach hurt   Dicyclomine Other (See Comments)    Dizziness and a "Drunk feeling"   Doxycycline Swelling   Levofloxacin     Other reaction(s): Dizziness   Lipitor [Atorvastatin]     Eye pain   Sertraline Other (See Comments)    "Sleepy"      Social History   Socioeconomic History   Marital status: Widowed    Spouse name: Not on file   Number of children: Not on file   Years of education: Not on file   Highest education level: Not on file  Occupational History   Occupation: retired  Tobacco Use   Smoking status: Former    Current packs/day: 0.00    Average packs/day: 0.8  packs/day for 51.0 years (38.3 ttl pk-yrs)    Types: Cigarettes    Start date: 03/07/1971    Quit date: 03/06/2022    Years since quitting: 1.3   Smokeless tobacco: Never  Vaping Use   Vaping status: Never Used  Substance and Sexual Activity   Alcohol use: No    Alcohol/week: 0.0 standard drinks of alcohol   Drug use: No   Sexual activity: Not on file  Other Topics Concern   Not on file  Social History Narrative   Not on file   Social Drivers of Health   Financial Resource Strain: Low Risk  (05/08/2023)   Received from [facility]   Overall Financial Resource Strain (CARDIA)    Difficulty of Paying Living Expenses: Not hard at all  Food Insecurity: No Food Insecurity (05/08/2023)   Received from [facility]   Hunger Vital Sign    Worried About Running Out of Food in the Last Year: Never true    Ran Out of Food in the Last Year: Never true  Transportation Needs: No Transportation Needs (05/08/2023)   Received from [facility] Transportation    In the past 12 months, has lack of transportation kept you from medical appointments or from getting medications?: No    Lack of Transportation (Non-Medical): No  Physical Activity: Not on file  Stress: Not on file  Social Connections: Not on file  Intimate Partner Violence: Not At Risk (03/07/2022)   Humiliation, Afraid, Rape, and Kick questionnaire    Fear of Current or Ex-Partner: No    Emotionally Abused: No    Physically Abused: No    Sexually Abused: No      Family History  Problem Relation Age of Onset   Vision loss Mother    Macular degeneration Mother    Dementia Mother    Kidney disease Father    COPD Sister    Uterine cancer Sister    Lung cancer Maternal Aunt    COPD Sister    Colon cancer Maternal Aunt    Ovarian cancer Maternal Aunt      PHYSICAL EXAM: Vitals:   07/26/23 1455  BP: 116/60  Pulse: 76  SpO2: 93%   Wt Readings from Last  3 Encounters:  07/26/23 123 lb 6 oz (56 kg)  05/14/23 123 lb 12.8 oz (56.2 kg)  04/01/23 124 lb (56.2 kg)   Physical exam: General:  Thin Well appearing. On O2 well appearing HEENT: normal Neck: supple. no JVD. Carotids 2+ bilat; no bruits. No lymphadenopathy or thryomegaly appreciated. Cor: PMI nondisplaced. Regular rate & rhythm. No rubs, gallops or murmurs. Lungs: clear Abdomen: soft, nontender, nondistended. No hepatosplenomegaly.  No bruits or masses. Good bowel sounds. Extremities: no cyanosis, clubbing, rash, edema Neuro: alert & orientedx3, cranial nerves grossly intact. moves all 4 extremities w/o difficulty. Affect pleasant   ASSESSMENT & PLAN:   1. PAH with cor pulmonale - Echo 10/23 EF 60-65% G1DD. Flattened septum. RV markedly dilated. Moderately HK  RVSP 70-79mmHG - CT chest 10/23. No PE. Very severe emphysema - CT angio 04/11/22. 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 Severe COPD.  - WHO Group II PAH -much improved with starting O2 supplementation and smoking cessation  - PFTs with normal spirometry with markedly reduced DLCO (18%) - unclear to me how spirometry is normal based on the amount of parenchymal lung disease on CT - Auto-immune serologies negative x for + RF (non-specific). CCP negative - Echo 06/28/22 EF 60-65% RV mildly dilated normal function (much improved from previous) RVSP 58 - Echo 03/26/23: EF 55-60% RV ok TR insufficient to estimate RVSP   RV function much improved on last echo. - Stable NYHA II  - Volume status ok  - Given severity of lung disease not candidate for selective pulmonary vasodilators due to risk for shunt  Repeat echo later this year to reassess RV. We have not done RHC given clinical improvement with O2. Can consider as needed  2, Small RUL PE  - CT chest 10/23 small PE but cannot exclude angiosarcoma (less likely) - F/u CT 2/24 partial recannalization of small PE - likely not big enough to affect PAH - On apixaban 2.5 bid per Ampilfy-EXT  trial. No bleeding. Continue lifelong therapy - LE Dopplers negative 12/23  3. Chronic heart failure with preserved ejection fraction  - BNP 03/06/22 was 1100.6 - This is mostly RV failure from [facility] - Volume looks good. Continue spiro  4. Severe COPD- - Follows with pulmonology Marcos Eke) - Remains on 2-3LNC - No change  5. Tobacco use- - stopped smoking 10/23 - had been smoking since the age of 67 - Remains quit  6. Small AAA - 2.9 cm on incidental screen 2/25 - repeat in 1 year   Arvilla Meres, MD  3:25 PM

## 2023-09-06 ENCOUNTER — Telehealth: Payer: Self-pay | Admitting: Internal Medicine

## 2023-09-06 ENCOUNTER — Other Ambulatory Visit: Payer: Self-pay

## 2023-09-06 MED ORDER — APIXABAN 2.5 MG PO TABS: 2.5000 mg | ORAL_TABLET | Freq: Two times a day (BID) | ORAL | 3 refills | Status: DC

## 2023-09-09 NOTE — Telephone Encounter (Signed)
 Refill was sent 4/11

## 2023-09-12 ENCOUNTER — Encounter: Payer: Self-pay | Admitting: Pulmonary Disease

## 2023-09-12 ENCOUNTER — Ambulatory Visit (INDEPENDENT_AMBULATORY_CARE_PROVIDER_SITE_OTHER): Payer: Medicare Other | Admitting: Pulmonary Disease

## 2023-09-12 VITALS — BP 96/60 | HR 72 | Temp 97.7°F | Ht 63.0 in | Wt 122.0 lb

## 2023-09-12 DIAGNOSIS — J9611 Chronic respiratory failure with hypoxia: Secondary | ICD-10-CM

## 2023-09-12 DIAGNOSIS — Z87891 Personal history of nicotine dependence: Secondary | ICD-10-CM

## 2023-09-12 DIAGNOSIS — I959 Hypotension, unspecified: Secondary | ICD-10-CM | POA: Diagnosis not present

## 2023-09-12 DIAGNOSIS — Z9981 Dependence on supplemental oxygen: Secondary | ICD-10-CM | POA: Diagnosis not present

## 2023-09-12 DIAGNOSIS — I272 Pulmonary hypertension, unspecified: Secondary | ICD-10-CM

## 2023-09-12 DIAGNOSIS — J449 Chronic obstructive pulmonary disease, unspecified: Secondary | ICD-10-CM

## 2023-09-12 DIAGNOSIS — J439 Emphysema, unspecified: Secondary | ICD-10-CM

## 2023-09-12 NOTE — Progress Notes (Signed)
 Subjective:    Patient ID: Holly Werner, female    DOB: 12/04/1950, 73 y.o.   MRN: [id number]  Patient Care Team: Delmus Ferri, MD as PCP - General (Physician Assistant) Drake Gens, RN as Registered Nurse Marc Senior, MD as Consulting Physician (Pulmonary Disease) Bensimhon, Rheta Celestine, MD as Consulting Physician (Cardiology)  Chief Complaint  Patient presents with   Follow-up    Shortness of breath. Occasional dry cough.     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  She previously followed with [facility] and established care here on 26 April 2022.  She was last seen by me on 14 May 2023, she presents today for follow-up.   HPI Discussed the use of AI scribe software for clinical note transcription with the patient, who gave verbal consent to proceed.  History of Present Illness   Holly Werner is a 73 year old female with stage four COPD who presents for follow-up.  She is experiencing increased fatigue and low blood pressure, with a recent reading of 96/60 mmHg. She is concerned about fluid retention in her abdomen and upper legs, which is unusual for her. She is currently on 40 mg of furosemide  and 25 mg of spironolactone , and her urination patterns have been inconsistent, with some days involving frequent urination and others not.  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 She has been feeling forgetful and is concerned about her CO2 levels. Her oxygen saturation is 94%, and she has been on two liters of oxygen for almost two years. Her oxygen levels drop during physical activity, such as shopping, but recover with rest.  She does not use an emergency inhaler due to adverse effects with Ventolin . Instead, she uses her regular  inhaler with two puffs as needed. She manages her oxygen levels with a portable device, adjusting the flow rate based on activity. Her oxygen levels can drop to the 70s during activity but recover with rest.    DATA 04/26/2022 Alpha 1: MM phenotype, 173 mg/dl level 40/98/1191 PFT: FEV1 2.07 L or 97% predicted, FVC 2.34 L or 82% of predicted, FEV1/FVC 89%, no bronchodilator response.  Lung volumes overall normal.  Overall no restriction, diffusion capacity severely impaired. 06/16/2022 sleep study: No evidence of sleep apnea, the patient did exhibit significant oxygen desaturations related to pulmonary disease. 06/28/2022 echocardiogram: LVEF 60 to 65%, moderately elevated pulmonary artery systolic pressure, RV systolic pressure calculated at 57.6 mmHg.  Moderate mitral valve regurgitation, moderate tricuspid valve regurgitation.  Review of Systems A 10 point review of systems was performed and it is as noted above otherwise negative.   Patient Active Problem List   Diagnosis Date Noted   Diastolic heart failure (HCC) 06/05/2022   Pulmonary embolism (HCC) 06/05/2022   Chronic respiratory failure with hypoxia (HCC) 06/05/2022   Tobacco abuse 03/07/2022   Swelling of lower extremity    Pulmonary hypertension, unspecified (HCC)    Chronic kidney disease, stage 3a (HCC) 03/06/2022   Acute heart failure (HCC) 03/06/2022   Acute respiratory failure with hypoxia (HCC) 03/06/2022   Cervical spine arthritis 12/05/2016   COPD (chronic obstructive pulmonary disease) (HCC) 12/05/2016   GERD (gastroesophageal reflux disease) 12/05/2016   IBS (irritable bowel syndrome) 12/05/2016   Nephrolithiasis 12/05/2016   Premature menopause 12/05/2016  Pulmonary nodule, right 12/05/2016   Appendicitis 12/05/2016   Acute appendicitis with localized peritonitis 12/05/2016   Acute appendicitis 12/03/2016   Personal history of tobacco use, presenting hazards to health 10/09/2016   Cervicalgia 10/28/2014    Myofascial muscle pain 10/28/2014   Neck pain 10/28/2014    Social History   Tobacco Use   Smoking status: Former    Current packs/day: 0.00    Average packs/day: 0.8 packs/day for 51.0 years (38.3 ttl pk-yrs)    Types: Cigarettes    Start date: 03/07/1971    Quit date: 03/06/2022    Years since quitting: 1.5   Smokeless tobacco: Never  Substance Use Topics   Alcohol use: No    Alcohol/week: 0.0 standard drinks of alcohol    Allergies  Allergen Reactions   Escitalopram Oxalate Palpitations    Loose stools   Amoxicillin -Pot Clavulanate     Other reaction(s): Vomiting   Aspirin Other (See Comments)    Stomach hurt   Codeine Other (See Comments)    stomach hurt   Dicyclomine Other (See Comments)    Dizziness and a "Drunk feeling"   Doxycycline Swelling   Levofloxacin     Other reaction(s): Dizziness   Lipitor [Atorvastatin]     Eye pain   Sertraline Other (See Comments)    "Sleepy"    Current Meds  Medication Sig   acetaminophen  (TYLENOL ) 500 MG tablet Take 500 mg by mouth as needed.   apixaban  (ELIQUIS ) 2.5 MG TABS tablet Take 1 tablet (2.5 mg total) by mouth 2 (two) times daily.   diazepam  (VALIUM ) 5 MG tablet Take 2.5 mg by mouth 2 (two) times daily as needed for anxiety.   furosemide  (LASIX ) 40 MG tablet TAKE 1 TABLET(40 MG) BY MOUTH DAILY   HYDROcodone -acetaminophen  (NORCO/VICODIN) 5-325 MG tablet Take 1 tablet by mouth every 6 (six) hours as needed.   Multiple Vitamins-Iron (MULTIVITAMIN/IRON PO) Take 1 capsule by mouth daily.   OXYGEN Inhale 1.5-2 L into the lungs continuous.   spironolactone  (ALDACTONE ) 25 MG tablet Take 0.5 tablets (12.5 mg total) by mouth daily.   Tiotropium Bromide-Olodaterol (STIOLTO RESPIMAT ) 2.5-2.5 MCG/ACT AERS Inhale 2 puffs into the lungs daily.     There is no immunization history on file for this patient.      Objective:     BP 96/60 (BP Location: Right Arm, Patient Position: Sitting, Cuff Size: Normal)   Pulse 72   Temp  97.7 F (36.5 C) (Temporal)   Ht 5\' 3"  (1.6 m)   Wt 122 lb (55.3 kg)   SpO2 94% Comment: 2L pulsed oxygen  BMI 21.61 kg/m   SpO2: 94 % (2L pulsed oxygen)  GENERAL: Thin well-developed woman, no acute distress, fully ambulatory.  No conversational dyspnea.  Using oxygen via nasal cannula. HEAD: Normocephalic, atraumatic.  EYES: Pupils equal, round, reactive to light.  No scleral icterus.  MOUTH: Oral mucosa moist.  No thrush.  Mallampati II airway NECK: Supple. No thyromegaly. Trachea midline. No JVD.  No adenopathy. PULMONARY: Distant breath sounds, symmetrical air entry bilaterally.  No adventitious sounds. CARDIOVASCULAR: S1 and S2. Regular rate and rhythm.  No rubs, murmurs or gallops heard. ABDOMEN: Benign. MUSCULOSKELETAL: No joint deformity, no clubbing, no edema.  NEUROLOGIC: No overt focal deficit, no gait disturbance, speech is fluent.  No asterixis. SKIN: Intact,warm,dry. PSYCH: Mood and behavior normal.    Assessment & Plan:     ICD-10-CM   1. Pulmonary emphysema, unspecified emphysema type (HCC)  J43.9  2. Chronic respiratory failure with hypoxia (HCC)  J96.11     3. Pulmonary hypertension, unspecified (HCC)  I27.20      Discussion:    Chronic Obstructive Pulmonary Disease (COPD) Stage 4, COPD on the basis of emphysema Stage 4 COPD with associated pulmonary hypertension. She experiences difficulty maintaining adequate oxygen saturation levels, particularly during physical activity, and has been on supplemental oxygen for almost two years. Her oxygen saturation drops significantly during exertion but recovers at rest. She is currently using two liters of oxygen. She reports not using an emergency inhaler due to adverse effects experienced with Ventolin . She is aware of the signs of CO2 retention, such as involuntary tremors, and her current oxygen level is 94%. - Continue oxygen therapy at two liters. - Monitor oxygen saturation levels during physical  activity. - Can adjust O2 flow during activity to 3 L to maintain O2 sats at 88% or better. - Consider alternative emergency inhaler options if needed. - Use Stiolto inhaler daily not as as needed.  Pulmonary Hypertension Pulmonary hypertension secondary to COPD, contributing to fluid retention in the abdomen and legs. The use of diuretics such as furosemide  and spironolactone  is causing low blood pressure, which may need adjustment. There is a concern that the furosemide  may be working too well, leading to excessive diuresis and hypotension. - Contact Dr. Lorayne Rocks to discuss potential adjustment of diuretic medications.  Hypotension Low blood pressure, with readings around 96/60 mmHg, likely due to the diuretic regimen. This is causing symptoms of fatigue and sleepiness. The balance between managing fluid retention and maintaining adequate blood pressure is challenging. - Contact Dr. Lorayne Rocks to discuss potential adjustment of diuretic medications.  Cognitive Concerns Increased forgetfulness, raising concerns about potential cognitive decline. However, her oxygen levels are stable, and there is no evidence of CO2 retention causing tremors. She is aware of the signs of CO2 retention and is monitoring for these symptoms. - Monitor cognitive function and consider further evaluation if symptoms persist.      Advised if symptoms do not improve or worsen, to please contact office for sooner follow up or seek emergency care.    I spent 41 minutes of dedicated to the care of this patient on the date of this encounter to include pre-visit review of records, face-to-face time with the patient discussing conditions above, post visit ordering of testing, clinical documentation with the electronic health record, making appropriate referrals as documented, and communicating necessary findings to members of the patients care team.     C. Chloe Counter, MD [facility] [place] [facility]    *This note was generated using voice recognition software/Dragon and/or AI transcription program.  Despite best efforts to proofread, errors can occur which can change the meaning. Any transcriptional errors that result from this process are unintentional and may not be fully corrected at the time of dictation.

## 2023-09-12 NOTE — Patient Instructions (Addendum)
 VISIT SUMMARY:  You came in today for a follow-up regarding your emphysema. You reported increased fatigue, low blood pressure, and concerns about fluid retention in your abdomen and upper legs. We discussed your current medications and their effects, as well as your oxygen levels and usage. You also mentioned experiencing forgetfulness and concerns about your CO2 levels.  YOUR PLAN:  - ADVANCED EMPHYSEMA: Emphysema is a severe form of lung disease that makes it difficult to breathe. You should continue your oxygen therapy at two liters and monitor your oxygen levels during physical activity.  You declined a rescue inhaler but states that you are taking your Stiolto 2 inhalations daily.  Continue taking that.  -PULMONARY HYPERTENSION: Pulmonary hypertension is high blood pressure in the lungs' arteries, often related to COPD and heart issues. This condition is contributing to fluid retention in your abdomen and legs.  I recommend you contact Dr. Lorayne Rocks to discuss potential adjustments to your diuretic medications to better manage your symptoms.  -HYPOTENSION: Hypotension means low blood pressure, which can cause fatigue and sleepiness. Your low blood pressure is 96/60.  This is still within the acceptable range of 90/60 to 120/80.  I recommend you contact Dr. Lorayne Rocks to discuss potential adjustments to these medications if the blood pressure still concerns you.  -COGNITIVE CONCERNS: You have been experiencing increased forgetfulness, which could be a sign of cognitive decline. Your oxygen levels are stable, and there is no evidence of CO2 retention causing tremors. We will monitor your cognitive function and consider further evaluation if your symptoms persist.  INSTRUCTIONS:  Consider contacting Dr. Lorayne Rocks to discuss potential adjustments to your diuretic medications. Please continue to monitor your oxygen levels during physical activity and report any significant changes. If your cognitive  symptoms persist, we may need to evaluate further.  Follow-up in 4 months time call sooner should any new problems arise.

## 2023-09-21 ENCOUNTER — Encounter: Payer: Self-pay | Admitting: Pulmonary Disease

## 2023-11-18 ENCOUNTER — Other Ambulatory Visit: Payer: Self-pay | Admitting: Internal Medicine

## 2023-12-17 LAB — COLOGUARD: COLOGUARD: POSITIVE — AB

## 2024-01-03 ENCOUNTER — Other Ambulatory Visit: Payer: Self-pay | Admitting: Internal Medicine

## 2024-01-14 ENCOUNTER — Encounter: Payer: Self-pay | Admitting: Pulmonary Disease

## 2024-01-14 ENCOUNTER — Telehealth: Payer: Self-pay

## 2024-01-14 ENCOUNTER — Ambulatory Visit (INDEPENDENT_AMBULATORY_CARE_PROVIDER_SITE_OTHER): Admitting: Pulmonary Disease

## 2024-01-14 ENCOUNTER — Other Ambulatory Visit
Admission: RE | Admit: 2024-01-14 | Discharge: 2024-01-14 | Disposition: A | Discharge status: Home / Self Care | Source: Ambulatory Visit | Attending: Pulmonary Disease | Admitting: Pulmonary Disease

## 2024-01-14 VITALS — BP 126/60 | HR 70 | Temp 98.2°F | Ht 63.0 in | Wt 122.0 lb

## 2024-01-14 DIAGNOSIS — I272 Pulmonary hypertension, unspecified: Secondary | ICD-10-CM | POA: Insufficient documentation

## 2024-01-14 DIAGNOSIS — J209 Acute bronchitis, unspecified: Secondary | ICD-10-CM

## 2024-01-14 DIAGNOSIS — J9611 Chronic respiratory failure with hypoxia: Secondary | ICD-10-CM

## 2024-01-14 DIAGNOSIS — J440 Chronic obstructive pulmonary disease with acute lower respiratory infection: Secondary | ICD-10-CM | POA: Diagnosis not present

## 2024-01-14 DIAGNOSIS — Z86711 Personal history of pulmonary embolism: Secondary | ICD-10-CM | POA: Diagnosis not present

## 2024-01-14 DIAGNOSIS — J439 Emphysema, unspecified: Secondary | ICD-10-CM

## 2024-01-14 DIAGNOSIS — R0602 Shortness of breath: Secondary | ICD-10-CM

## 2024-01-14 LAB — D-DIMER, QUANTITATIVE: D-Dimer, Quant: 2.22 ug{FEU}/mL — ABNORMAL HIGH (ref 0.00–0.50)

## 2024-01-14 MED ORDER — CIPROFLOXACIN HCL 500 MG PO TABS: 500.0000 mg | ORAL_TABLET | Freq: Two times a day (BID) | ORAL | 0 refills | Status: AC

## 2024-01-14 NOTE — Patient Instructions (Signed)
 VISIT SUMMARY:  You came in today for a follow-up evaluation due to worsening symptoms related to your severe emphysema. Over the past week, you have experienced a low-grade fever, increased shortness of breath, and a hacking cough with green and brown sputum. You recently completed a course of azithromycin (Z-Pak) and prednisone, which provided some relief. You also reported intermittent chest pain and mild nausea last week.  YOUR PLAN:  -SEVERE EMPHYSEMA WITH ACUTE EXACERBATION: Severe emphysema is a chronic lung condition that causes difficulty breathing. You are currently experiencing an acute exacerbation, which means your symptoms have suddenly worsened. We will order Ohtuvayre  for nebulizer treatment to help manage your COPD symptoms. You will also be prescribed Cipro  to complete your antibiotic course. Additionally, we will repeat your pulmonary function tests to monitor your lung function.  -MUSCLE STRAIN OF CHEST WALL: The intermittent chest pain you are experiencing is likely due to a muscle strain in your chest wall, not related to your lungs. This type of pain can occur with deep breathing or certain movements.  We will check a blood test however to make sure there is nothing else going on.  INSTRUCTIONS:  We will order Ohtuvayre  via a specialty pharmacy for your nebulizer treatment and provide you with a nebulizer. You will also be prescribed Cipro  to complete your antibiotic course. Please follow up for repeat pulmonary function tests to monitor your lung function.

## 2024-01-14 NOTE — Telephone Encounter (Signed)
 Patient seen today in office by Dr. Pat. She will be started on Ohtuvayre . Patient signed form. Will be faxed to pharmacy team.

## 2024-01-14 NOTE — Progress Notes (Signed)
 Subjective:    Patient ID: Holly Werner, female    DOB: 1951-03-06, 72 y.o.   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  She previously followed with [facility] and established care here on 26 April 2022.  She was last seen by me on 12 September 2023, she presents today for follow-up.   HPI Discussed the use of AI scribe software for clinical note transcription with the patient, who gave verbal consent to proceed.  History of Present Illness   Holly Werner is a 73 year old female with severe emphysema who presents for a follow-up evaluation.  Over the past week, she has experienced a low-grade fever daily and worsening dyspnea. She describes a hacking cough that has increased in frequency, and on Friday, she had a particularly severe episode prompting a visit to urgent care.  During the visit, she underwent a chest X-ray, blood work, and urinalysis, which were reported as normal. Her creatinine level was noted to be on the higher end but stable. Despite these findings, she continues to experience a productive cough with green and brown sputum.  She was prescribed a Z-Pak and prednisone, which she has been taking. She completed her last dose of the Z-Pak today. She experiences similar episodes every six to seven weeks, which she finds concerning.  She reports intermittent chest pain, which she has been informed might be due to scar tissue. The pain is not constant but  occurs sometimes with deep breathing. Last week, she experienced mild nausea, which she attributes to a possible viral illness.  Her current medications include Stiolto, which she uses daily. She is unsure about her tolerance to Cipro , as she has not used it before. No current use of a nebulizer.     DATA 04/26/2022 Alpha 1: MM phenotype, 173 mg/dl level 87/78/7976 PFT: FEV1 2.07 L or 97% predicted, FVC 2.34 L or 82% of predicted, FEV1/FVC 89%, no bronchodilator response.  Lung volumes overall normal.  Overall no restriction, diffusion capacity severely impaired. 06/16/2022 sleep study: No evidence of sleep apnea, the patient did exhibit significant oxygen desaturations related to pulmonary disease. 06/28/2022 echocardiogram: LVEF 60 to 65%, moderately elevated pulmonary artery systolic pressure, RV systolic pressure calculated at 57.6 mmHg.  Moderate mitral valve regurgitation, moderate tricuspid valve regurgitation.   Review of Systems A 10 point review of systems was performed and it is as noted above otherwise negative.   Patient Active Problem List   Diagnosis Date Noted   Diastolic heart failure (HCC) 06/05/2022   Pulmonary embolism (HCC) 06/05/2022   Chronic respiratory failure with hypoxia (HCC) 06/05/2022   Tobacco abuse 03/07/2022   Swelling of lower extremity    Pulmonary hypertension, unspecified (HCC)    Chronic kidney disease, stage 3a (HCC) 03/06/2022   Acute heart failure (HCC) 03/06/2022   Acute respiratory failure with hypoxia (HCC) 03/06/2022   Cervical spine arthritis 12/05/2016   COPD (chronic obstructive pulmonary disease) (HCC) 12/05/2016   GERD (gastroesophageal reflux disease) 12/05/2016   IBS (irritable bowel  syndrome) 12/05/2016   Nephrolithiasis 12/05/2016   Premature menopause 12/05/2016   Pulmonary nodule, right 12/05/2016   Appendicitis 12/05/2016   Acute appendicitis with localized peritonitis 12/05/2016   Acute appendicitis 12/03/2016   Personal  history of tobacco use, presenting hazards to health 10/09/2016   Cervicalgia 10/28/2014   Myofascial muscle pain 10/28/2014   Neck pain 10/28/2014    Social History   Tobacco Use   Smoking status: Former    Current packs/day: 0.00    Average packs/day: 0.8 packs/day for 51.0 years (38.3 ttl pk-yrs)    Types: Cigarettes    Start date: 03/07/1971    Quit date: 03/06/2022    Years since quitting: 1.9   Smokeless tobacco: Never  Substance Use Topics   Alcohol use: No    Alcohol/week: 0.0 standard drinks of alcohol    Allergies  Allergen Reactions   Escitalopram Oxalate Palpitations    Loose stools   Amoxicillin -Pot Clavulanate     Other reaction(s): Vomiting   Aspirin Other (See Comments)    Stomach hurt   Codeine Other (See Comments)    stomach hurt   Dicyclomine Other (See Comments)    Dizziness and a Drunk feeling   Doxycycline Swelling   Levofloxacin     Other reaction(s): Dizziness   Lipitor [Atorvastatin]     Eye pain   Sertraline Other (See Comments)    Sleepy    Current Meds  Medication Sig   acetaminophen  (TYLENOL ) 500 MG tablet Take 500 mg by mouth as needed.   [EXPIRED] azithromycin (ZITHROMAX) 250 MG tablet Take 500 mg by mouth once.   chlorpheniramine-HYDROcodone  (TUSSIONEX) 10-8 MG/5ML Take 5 mLs by mouth as needed for cough.   [EXPIRED] ciprofloxacin  (CIPRO ) 500 MG tablet Take 1 tablet (500 mg total) by mouth 2 (two) times daily for 7 days.   diazepam  (VALIUM ) 5 MG tablet Take 2.5 mg by mouth 2 (two) times daily as needed for anxiety.   ELIQUIS  2.5 MG TABS tablet TAKE 1 TABLET(2.5 MG) BY MOUTH TWICE DAILY   furosemide  (LASIX ) 40 MG tablet TAKE 1 TABLET(40 MG) BY MOUTH DAILY   HYDROcodone -acetaminophen  (NORCO/VICODIN) 5-325 MG tablet Take 1 tablet by mouth every 6 (six) hours as needed.   Multiple Vitamins-Iron (MULTIVITAMIN/IRON PO) Take 1 capsule by mouth daily.   OXYGEN Inhale 1.5-2 L into the lungs continuous.   [EXPIRED] predniSONE  (DELTASONE) 20 MG tablet Take 40 mg by mouth daily with breakfast.   spironolactone  (ALDACTONE ) 25 MG tablet Take 0.5 tablets (12.5 mg total) by mouth daily.   Tiotropium Bromide-Olodaterol (STIOLTO RESPIMAT ) 2.5-2.5 MCG/ACT AERS Inhale 2 puffs into the lungs daily.     There is no immunization history on file for this patient.      Objective:     BP 126/60 (BP Location: Right Arm, Patient Position: Sitting, Cuff Size: Normal)   Pulse 70   Temp 98.2 F (36.8 C) (Oral)   Ht 5' 3 (1.6 m)   Wt 122 lb (55.3 kg)   SpO2 94%   BMI 21.61 kg/m   SpO2: 94 % O2 Device: Nasal cannula O2 Flow Rate (L/min): 2 L/min O2 Type: Continuous O2  GENERAL: Thin well-developed woman, no acute distress, fully ambulatory.  No conversational dyspnea.  Using oxygen via nasal cannula. HEAD: Normocephalic, atraumatic.  EYES: Pupils equal, round, reactive to light.  No scleral icterus.  MOUTH: Oral mucosa moist.  No thrush.  Mallampati II airway NECK: Supple. No thyromegaly. Trachea midline. No JVD.  No adenopathy.  PULMONARY: Distant breath sounds, symmetrical air entry bilaterally.  No adventitious sounds. CARDIOVASCULAR: S1 and S2. Regular rate and rhythm.  No rubs, murmurs or gallops heard. ABDOMEN: Benign. MUSCULOSKELETAL: No joint deformity, no clubbing, no edema.  NEUROLOGIC: No overt focal deficit, no gait disturbance, speech is fluent.  No asterixis. SKIN: Intact,warm,dry. PSYCH: Mood and behavior normal.        Assessment & Plan:     ICD-10-CM   1. Pulmonary emphysema, unspecified emphysema type (HCC)  J43.9 Pulmonary function test   Patient does not have airway limitation but does have structural disease    2. Acute bronchitis with COPD (HCC)  J44.0    J20.9     3. Chronic respiratory failure with hypoxia (HCC)  J96.11     4. Pulmonary hypertension, unspecified (HCC)  I27.20 D-Dimer, Quantitative    5. Shortness of breath  R06.02 Pulmonary function test    6. History of  pulmonary embolism  Z86.711       Orders Placed This Encounter  Procedures   D-Dimer, Quantitative    Standing Status:   Future    Number of Occurrences:   1    Expiration Date:   01/13/2025   Pulmonary function test    Standing Status:   Future    Expiration Date:   01/13/2025    Where should this test be performed?:   Outpatient Pulmonary    What type of PFT is being ordered?:   Full PFT    Meds ordered this encounter  Medications   ciprofloxacin  (CIPRO ) 500 MG tablet    Sig: Take 1 tablet (500 mg total) by mouth 2 (two) times daily for 7 days.    Dispense:  14 tablet    Refill:  0   Discussion:    Severe emphysema with acute exacerbation Experiencing an acute exacerbation of severe emphysema, characterized by increased coughing, production of green and brown sputum, and worsening dyspnea. Recent evaluation included a chest X-ray, blood work, and urinalysis, which were unremarkable. Completed a course of azithromycin (Z-Pak) and prednisone, providing some relief. Exacerbation appears recurrent, occurring every six to seven weeks. Currently experiencing a new onset of symptoms, possibly due to infection or inflammation. Using Stiolto daily for COPD management. Discussed Ohtuvayre  a nebulizer medication for COPD, requiring specialty pharmacy access and potential financial assistance through grants. - Order Ohtuvayre  via specialty pharmacy for nebulizer treatment - Provide nebulizer with Ohtuvayre  - Prescribe Cipro  to complete antibiotic course (concerned about Pseudomonas colonization) - Repeat pulmonary function tests  Muscle strain of chest wall Intermittent chest pain localized to the chest wall, likely due to muscle strain rather than pulmonary origin. Pain is not constant and does not appear to be related to lung pathology.  Will check D-dimer, consider chest CT if positive.     Advised if symptoms do not improve or worsen, to please contact office for sooner follow up or seek  emergency care.    I spent 40 minutes of dedicated to the care of this patient on the date of this encounter to include pre-visit review of records, face-to-face time with the patient discussing conditions above, post visit ordering of testing, clinical documentation with the electronic health record, making appropriate referrals as documented, and communicating necessary findings to members of the patients care team.     C. Leita Sanders, MD Advanced [facility]    *This note was generated using voice recognition software/Dragon and/or AI transcription program.  Despite best efforts to proofread,  errors can occur which can change the meaning. Any transcriptional errors that result from this process are unintentional and may not be fully corrected at the time of dictation.

## 2024-01-15 ENCOUNTER — Ambulatory Visit: Payer: Self-pay | Admitting: Pulmonary Disease

## 2024-01-15 ENCOUNTER — Ambulatory Visit
Admission: RE | Admit: 2024-01-15 | Discharge: 2024-01-15 | Disposition: A | Discharge status: Home / Self Care | Source: Ambulatory Visit | Attending: Pulmonary Disease | Admitting: Pulmonary Disease

## 2024-01-15 DIAGNOSIS — R0602 Shortness of breath: Secondary | ICD-10-CM | POA: Diagnosis present

## 2024-01-15 MED ORDER — IOHEXOL 350 MG/ML SOLN
75.0000 mL | Freq: Once | INTRAVENOUS | Status: AC | PRN
  Administered 2024-01-15: 75 mL via INTRAVENOUS

## 2024-01-15 NOTE — Telephone Encounter (Signed)
-----   Message from Dedra Sanders sent at 01/15/2024  8:24 AM EDT ----- I am going to recommend that we do a CT angio chest we will place that order. ----- Message ----- From: Rebecka, [facility] [place] Sent: 01/14/2024   5:46 PM EDT To: Dedra LITTIE Sanders, MD

## 2024-01-15 NOTE — Telephone Encounter (Signed)
 LMTCB. E2C2 please advise when patient calls back.

## 2024-01-15 NOTE — Telephone Encounter (Signed)
-----   Message from Evalene VEAR Louder, [place] sent at 01/15/2024  9:11 AM EDT -----   ----- Message ----- From: Tamea Dedra CROME, MD Sent: 01/15/2024   8:24 AM EDT To: [facility]  I am going to recommend that we do a CT angio chest we will place that order. ----- Message ----- From: Rebecka, [facility] [place] Sent: 01/14/2024   5:46 PM EDT To: Dedra CROME Tamea, MD

## 2024-01-16 ENCOUNTER — Telehealth: Payer: Self-pay

## 2024-01-16 NOTE — Telephone Encounter (Signed)
 Received Ohtuvayre  new start paperwork. Completed form and faxed with clinicals and insurance card copy to [employer]  **Will send paperwork pending completion of documentation for office visit on 8/19**   Phone#: [phone] Fax#: [phone]

## 2024-01-16 NOTE — Telephone Encounter (Signed)
 Received Ohtuvayre  new start paperwork. Completed form and faxed with clinicals and insurance card copy to [facility]   Phone#: [phone] Fax#: [phone]

## 2024-01-20 NOTE — Telephone Encounter (Signed)
 Received fax from [employer] with summary of benefits. Referral form for Ohtuvayre  received. Rx will be triaged to [facility].. Once benefits investigation completed, pharmacy will reach out the patient to schedule shipment. If medication is unaffordable, patient will need to express financial hardship to be referred back to [facility] for patient assistance program pre-screening.   Patient ID: [id number] Pharmacy phone: [phone] [facility] Phone#: [phone]

## 2024-01-23 ENCOUNTER — Telehealth: Payer: Self-pay | Admitting: Internal Medicine

## 2024-01-23 NOTE — Telephone Encounter (Signed)
 Called to confirm/remind patient of their appointment at the [facility] on 01/24/24.   Appointment:   [x] Confirmed  [] Left mess   [] No answer/No voice mail  [] VM Full/unable to leave message  [] Phone not in service  Patient reminded to bring all medications and/or complete list.  Confirmed patient has transportation. Gave directions, instructed to utilize valet parking.

## 2024-01-24 ENCOUNTER — Ambulatory Visit: Attending: Internal Medicine | Admitting: Internal Medicine

## 2024-01-24 VITALS — BP 123/58 | HR 67 | Wt 122.0 lb

## 2024-01-24 DIAGNOSIS — R079 Chest pain, unspecified: Secondary | ICD-10-CM | POA: Diagnosis not present

## 2024-01-24 DIAGNOSIS — Z87891 Personal history of nicotine dependence: Secondary | ICD-10-CM | POA: Insufficient documentation

## 2024-01-24 DIAGNOSIS — N189 Chronic kidney disease, unspecified: Secondary | ICD-10-CM | POA: Insufficient documentation

## 2024-01-24 DIAGNOSIS — I5032 Chronic diastolic (congestive) heart failure: Secondary | ICD-10-CM | POA: Diagnosis not present

## 2024-01-24 DIAGNOSIS — J432 Centrilobular emphysema: Secondary | ICD-10-CM

## 2024-01-24 DIAGNOSIS — I714 Abdominal aortic aneurysm, without rupture, unspecified: Secondary | ICD-10-CM | POA: Insufficient documentation

## 2024-01-24 DIAGNOSIS — J439 Emphysema, unspecified: Secondary | ICD-10-CM | POA: Diagnosis not present

## 2024-01-24 DIAGNOSIS — I272 Pulmonary hypertension, unspecified: Secondary | ICD-10-CM | POA: Insufficient documentation

## 2024-01-24 DIAGNOSIS — I2781 Cor pulmonale (chronic): Secondary | ICD-10-CM | POA: Insufficient documentation

## 2024-01-24 DIAGNOSIS — Z7901 Long term (current) use of anticoagulants: Secondary | ICD-10-CM | POA: Insufficient documentation

## 2024-01-24 DIAGNOSIS — I25118 Atherosclerotic heart disease of native coronary artery with other forms of angina pectoris: Secondary | ICD-10-CM | POA: Insufficient documentation

## 2024-01-24 DIAGNOSIS — R002 Palpitations: Secondary | ICD-10-CM | POA: Insufficient documentation

## 2024-01-24 DIAGNOSIS — Z79899 Other long term (current) drug therapy: Secondary | ICD-10-CM | POA: Insufficient documentation

## 2024-01-24 NOTE — Patient Instructions (Signed)
 Medication Changes:  No medication changes today!   Testing/Procedures:    Your cardiac CT will be scheduled at one of the below locations:   [facility] [address] [place], [place] [address] [phone]     If scheduled at [facility], please arrive to the Heart and Vascular Center 15 mins early for check-in and test prep.  There is spacious parking and easy access to the radiology department from the [facility] entrance. Please enter here and check-in with the desk attendant.    Please follow these instructions carefully (unless otherwise directed):  An IV will be required for this test and Nitroglycerin will be given.  Hold all erectile dysfunction medications at least 3 days (72 hrs) prior to test. (Ie viagra , cialis, sildenafil , tadalafil, etc)   On the Night Before the Test: Be sure to Drink plenty of water. Do not consume any caffeinated/decaffeinated beverages or chocolate 12 hours prior to your test. Do not take any antihistamines 12 hours prior to your test.    On the Day of the Test: Drink plenty of water until 1 hour prior to the test. Do not eat any food 1 hour prior to test. You may take your regular medications prior to the test.  Take metoprolol (Lopressor) two hours prior to test. If you take Furosemide /Hydrochlorothiazide/Spironolactone /Chlorthalidone, please HOLD on the morning of the test. Patients who wear a continuous glucose monitor MUST remove the device prior to scanning. FEMALES- please wear underwire-free bra if available, avoid dresses & tight clothing         After the Test: Drink plenty of water. After receiving IV contrast, you may experience a mild flushed feeling. This is normal. On occasion, you may experience a mild rash up to 24 hours after the test. This is not dangerous. If this occurs, you can take Benadryl 25 mg, Zyrtec, Claritin, or Allegra and increase your fluid  intake. (Patients taking Tikosyn should avoid Benadryl, and may take Zyrtec, Claritin, or Allegra) If you experience trouble breathing, this can be serious. If it is severe call 911 IMMEDIATELY. If it is mild, please call our office.  We will call to schedule your test 2-4 weeks out understanding that some insurance companies will need an authorization prior to the service being performed.   For more information and frequently asked questions, please visit our website : [email or web address]  For non-scheduling related questions, please contact the cardiac imaging nurse navigator should you have any questions/concerns: Cardiac Imaging Nurse Navigators Direct Office Dial: [phone]     Follow-Up in: Please follow up with the [facility] in 3 months with Dr. Bensimhon. We do not currently have that schedule. Please give us  a call at October in order to schedule your appointment for November.    Thank you for choosing [facility].    At the [facility], you and your health needs are our priority. We have a designated team specialized in the treatment of Heart Failure. This Care Team includes your primary Heart Failure Specialized Cardiologist (physician), Advanced Practice Providers (APPs- Physician Assistants and Nurse Practitioners), and Pharmacist who all work together to provide you with the care you need, when you need it.   You may see any of the following providers on your designated Care Team at your next follow up:  Dr. Toribio Fuel Dr. Ezra Shuck Dr. Ria Commander Dr. Morene Zenaida Ellouise Donette, FNP Jaun Bash,  RPH-CPP  Please be sure to bring in all your medications bottles to every appointment.   Need to Contact Us :  If you have any questions or concerns before your next appointment please send us  a message through [place] or call our office at [phone].    TO LEAVE A  MESSAGE FOR THE NURSE SELECT OPTION 2, PLEASE LEAVE A MESSAGE INCLUDING: YOUR NAME DATE OF BIRTH CALL BACK NUMBER REASON FOR CALL**this is important as we prioritize the call backs  YOU WILL RECEIVE A CALL BACK THE SAME DAY AS LONG AS YOU CALL BEFORE 4:00 PM

## 2024-01-24 NOTE — Progress Notes (Signed)
 [facility] HF CLINIC NOTE  Referring Physician: Dr. Darliss Primary Care: Marikay Eva POUR, [place] Primary Cardiologist: Dr. Darliss  HPI:  [name] is a 73 y/o female with a history of CKD, severe COPD (> 50 pack years), previous tobacco use. Diagnosed with diastolic HF and PAH in the hospital in 10/23. Referred for further evalation of PAH.    Admitted 03/06/22 due to several day history of shortness of breath on exertion and leg swelling. Sats in 80s on admit. Chest CTA negative for PE but does show severe emphysema and pulmonary HTN. Cardiology & Pulmonology consults obtained. Did need oxygen at discharge. IV lasix  provided and transitioned to oral diuretics. Discharged after 3 days. Started lasix  40 daily and sildenafil  20 tid.   Echo 10/23 EF 60-65% G1DD. Flattened septum. RV markedly dilated. Moderately HK  RVSP 70-47mmHG  PH serologies + for RF all others negative  Seen in Pulmonary Clinic. (Dr. Aleskarov). CT angio 04/11/22. 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 Severe COPD.   Sleep study 06/20/22 AHI 3.9 lowest sat 83% (O2 increased to 3L at night)  Echo 06/28/22 EF 60-65% RV mildly dilated normal function  RVSP  Sleep study AHI 3.9 Sats as low as 83%   PFTs 05/17/22 FEV1 2.07 (97%) FVC 2.34 (82%) DLCO 18%  Echo 03/26/23: EF 55-60% RV ok TR insufficient to estimate RVSP. Aortic sclerosis   Here with her daughter for routine f/u. Says she has felt bad for 4 weeks - overall malaise. About a week ago had severe CP. Started after dinner and lasted until 2a. Saw PCP and had elevated d-Dimer -> CT chest. No acute PE. Chronic thrombosis in RUL (no change). + CACs + palpitations. No CP since that time. Still SOB with mild activity. No edema, orthopnea or PND     Past Medical History:  Diagnosis Date   CHF (congestive heart failure) (HCC)    Chronic hypoxic respiratory failure, on home oxygen therapy (HCC)    Chronic kidney disease    Chronic pain    Emphysema lung (HCC)     Family history of adverse reaction to anesthesia    Son - Propofol  - increased BP and HR, aggitation   GERD (gastroesophageal reflux disease)    Hx of pulmonary embolus    Moderate mitral valve regurgitation    Moderate tricuspid regurgitation by prior echocardiogram    Pulmonary HTN (HCC)    Thoracic outlet syndrome     Current Outpatient Medications  Medication Sig Dispense Refill   acetaminophen  (TYLENOL ) 500 MG tablet Take 500 mg by mouth as needed.     chlorpheniramine-HYDROcodone  (TUSSIONEX) 10-8 MG/5ML Take 5 mLs by mouth as needed for cough.     diazepam  (VALIUM ) 5 MG tablet Take 2.5 mg by mouth 2 (two) times daily as needed for anxiety.     ELIQUIS  2.5 MG TABS tablet TAKE 1 TABLET(2.5 MG) BY MOUTH TWICE DAILY 60 tablet 3   furosemide  (LASIX ) 40 MG tablet TAKE 1 TABLET(40 MG) BY MOUTH DAILY 90 tablet 3   HYDROcodone -acetaminophen  (NORCO/VICODIN) 5-325 MG tablet Take 1 tablet by mouth every 6 (six) hours as needed.     Multiple Vitamins-Iron (MULTIVITAMIN/IRON PO) Take 1 capsule by mouth daily.     OXYGEN Inhale 1.5-2 L into the lungs continuous.     spironolactone  (ALDACTONE ) 25 MG tablet Take 0.5 tablets (12.5 mg total) by mouth daily. 45 tablet 3   Tiotropium Bromide-Olodaterol (STIOLTO RESPIMAT ) 2.5-2.5 MCG/ACT AERS Inhale 2 puffs into the lungs daily.  4 g 11   No current facility-administered medications for this visit.    Allergies  Allergen Reactions   Escitalopram Oxalate Palpitations    Loose stools   Amoxicillin -Pot Clavulanate     Other reaction(s): Vomiting   Aspirin Other (See Comments)    Stomach hurt   Codeine Other (See Comments)    stomach hurt   Dicyclomine Other (See Comments)    Dizziness and a Drunk feeling   Doxycycline Swelling   Levofloxacin     Other reaction(s): Dizziness   Lipitor [Atorvastatin]     Eye pain   Sertraline Other (See Comments)    Sleepy      Social History   Socioeconomic History   Marital status: Widowed     Spouse name: Not on file   Number of children: Not on file   Years of education: Not on file   Highest education level: Not on file  Occupational History   Occupation: retired  Tobacco Use   Smoking status: Former    Current packs/day: 0.00    Average packs/day: 0.8 packs/day for 51.0 years (38.3 ttl pk-yrs)    Types: Cigarettes    Start date: 03/07/1971    Quit date: 03/06/2022    Years since quitting: 1.8   Smokeless tobacco: Never  Vaping Use   Vaping status: Never Used  Substance and Sexual Activity   Alcohol use: No    Alcohol/week: 0.0 standard drinks of alcohol   Drug use: No   Sexual activity: Not on file  Other Topics Concern   Not on file  Social History Narrative   Not on file   Social Drivers of Health   Financial Resource Strain: Low Risk  (10/01/2023)   Received from [facility]   Overall Financial Resource Strain (CARDIA)    Difficulty of Paying Living Expenses: Not hard at all  Food Insecurity: No Food Insecurity (10/01/2023)   Received from [facility]   Hunger Vital Sign    Within the past 12 months, you worried that your food would run out before you got the money to buy more.: Never true    Within the past 12 months, the food you bought just didn't last and you didn't have money to get more.: Never true  Transportation Needs: No Transportation Needs (10/01/2023)   Received from [facility] - Transportation    In the past 12 months, has lack of transportation kept you from medical appointments or from getting medications?: No    Lack of Transportation (Non-Medical): No  Physical Activity: Not on file  Stress: Not on file  Social Connections: Not on file  Intimate Partner Violence: Not At Risk (03/07/2022)   Humiliation, Afraid, Rape, and Kick questionnaire    Fear of Current or Ex-Partner: No    Emotionally Abused: No    Physically Abused: No    Sexually Abused: No      Family History   Problem Relation Age of Onset   Vision loss Mother    Macular degeneration Mother    Dementia Mother    Kidney disease Father    COPD Sister    Uterine cancer Sister    Lung cancer Maternal Aunt    COPD Sister    Colon cancer Maternal Aunt    Ovarian cancer Maternal Aunt      PHYSICAL EXAM: Vitals:   01/24/24 1433 01/24/24 1434  BP: (!) 123/58   Pulse: 72 67  SpO2: 91% 93%    Wt Readings from Last 3 Encounters:  01/24/24 122 lb (55.3 kg)  01/14/24 122 lb (55.3 kg)  09/12/23 122 lb (55.3 kg)   Physical exam: General:  Thin elderly  No resp difficulty HEENT: normal Neck: supple. no JVD. Carotids 2+ bilat; no bruits. No lymphadenopathy or thryomegaly appreciated. Cor: PMI nondisplaced. Regular rate & rhythm. No rubs, gallops or murmurs. Lungs: decreased throughout Abdomen: soft, nontender, nondistended. No hepatosplenomegaly. No bruits or masses. Good bowel sounds. Extremities: no cyanosis, clubbing, rash, edema Neuro: alert & orientedx3, cranial nerves grossly intact. moves all 4 extremities w/o difficulty. Affect pleasant   ASSESSMENT & PLAN:   1. CAD with chest pain/pressure - multiple CRFs with coronary calcifications on CT - we discussed options. Will plan CTA cors to further evaluate - given associated palpitations also suggested she get an AliveCor device   2. PAH with cor pulmonale - Echo 10/23 EF 60-65% G1DD. Flattened septum. RV markedly dilated. Moderately HK  RVSP 70-75mmHG - CT chest 10/23. No PE. Very severe emphysema - CT angio 04/11/22. 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 Severe COPD.  - WHO Group II PAH -much improved with starting O2 supplementation and smoking cessation  - PFTs with normal spirometry with markedly reduced DLCO (18%) - unclear to me how spirometry is normal based on the amount of parenchymal lung disease on CT - Auto-immune serologies negative x for + RF (non-specific). CCP negative - Echo 06/28/22 EF 60-65% RV mildly dilated normal function  (much improved from previous) RVSP 58 - Echo 03/26/23: EF 55-60% RV ok TR insufficient to estimate RVSP   RV function much improved on last echo. - NYHA II-III - Volume status OK - Given severity of lung disease not candidate for selective pulmonary vasodilators due to risk for shunt - We have not done RHC given clinical improvement with O2. Can consider as needed -> no need at this point  3, Small RUL PE  - CT chest 10/23 small PE but cannot exclude angiosarcoma (less likely) - F/u CT 2/24 partial recannalization of small PE - stable on CT 01/15/24 - likely not big enough to affect PAH - On apixaban  2.5 bid per Ampilfy-EXT trial. No bleeding. Continue lifelong therapy - LE Dopplers negative 12/23  4. Chronic heart failure with preserved ejection fraction  - BNP 03/06/22 was 1100.6 - This is mostly RV failure from [facility] - Volume ok. Continue spiro (not on SGLT2 with UTis)  5. Severe COPD- - Follows with pulmonology [place]) - Remains on 2-3LNC - No change  6. Tobacco use- - stopped smoking 10/23 - had been smoking since the age of 18 - Remains quit  7. Small AAA - 2.9 cm on incidental screen 2/25 - repeat in 1 year   Toribio Fuel, MD  2:46 PM

## 2024-02-14 ENCOUNTER — Ambulatory Visit: Admitting: Pulmonary Disease

## 2024-02-14 ENCOUNTER — Ambulatory Visit (INDEPENDENT_AMBULATORY_CARE_PROVIDER_SITE_OTHER): Admitting: Pulmonary Disease

## 2024-02-14 ENCOUNTER — Encounter: Payer: Self-pay | Admitting: Pulmonary Disease

## 2024-02-14 VITALS — BP 120/60 | HR 73 | Temp 97.6°F | Ht 63.0 in | Wt 120.8 lb

## 2024-02-14 DIAGNOSIS — J449 Chronic obstructive pulmonary disease, unspecified: Secondary | ICD-10-CM | POA: Diagnosis not present

## 2024-02-14 DIAGNOSIS — R0602 Shortness of breath: Secondary | ICD-10-CM

## 2024-02-14 DIAGNOSIS — I272 Pulmonary hypertension, unspecified: Secondary | ICD-10-CM

## 2024-02-14 DIAGNOSIS — J439 Emphysema, unspecified: Secondary | ICD-10-CM | POA: Diagnosis not present

## 2024-02-14 DIAGNOSIS — J9611 Chronic respiratory failure with hypoxia: Secondary | ICD-10-CM

## 2024-02-14 DIAGNOSIS — J432 Centrilobular emphysema: Secondary | ICD-10-CM | POA: Diagnosis not present

## 2024-02-14 LAB — PULMONARY FUNCTION TEST
FEF 25-75 Post: 2.74 L/s
FEF 25-75 Pre: 2.57 L/s
FEF2575-%Change-Post: 6 %
FEF2575-%Pred-Post: 170 %
FEF2575-%Pred-Pre: 160 %
FEV1-%Change-Post: 0 %
FEV1-%Pred-Post: 108 %
FEV1-%Pred-Pre: 107 %
FEV1-Post: 2.18 L
FEV1-Pre: 2.17 L
FEV1FVC-%Change-Post: 0 %
FEV1FVC-%Pred-Pre: 118 %
FEV6-%Change-Post: 0 %
FEV6-%Pred-Post: 95 %
FEV6-%Pred-Pre: 95 %
FEV6-Post: 2.45 L
FEV6-Pre: 2.45 L
FEV6FVC-%Pred-Post: 105 %
FEV6FVC-%Pred-Pre: 105 %
FVC-%Change-Post: 0 %
FVC-%Pred-Post: 90 %
FVC-%Pred-Pre: 90 %
FVC-Post: 2.45 L
FVC-Pre: 2.45 L
Post FEV1/FVC ratio: 89 %
Post FEV6/FVC ratio: 100 %
Pre FEV1/FVC ratio: 89 %
Pre FEV6/FVC Ratio: 100 %

## 2024-02-14 NOTE — Patient Instructions (Signed)
 VISIT SUMMARY:  Holly Werner, you had a follow-up appointment to discuss your severe COPD, joint pain, and a suspected allergic reaction to ciprofloxacin . We also reviewed your vaccination history and discussed your caregiving responsibilities.  YOUR PLAN:  -SEVERE CHRONIC OBSTRUCTIVE PULMONARY DISEASE WITH EMPHYSEMA: Severe COPD with emphysema affects your lungs' ability to exchange oxygen. Your oxygen levels have decreased over the past five weeks. We recommend you join a pulmonary rehabilitation program to improve your respiratory function and quality of life. We will reassess your condition in four months.  -ADVERSE REACTION TO CIPROFLOXACIN : You reported a significant gastrointestinal discomfort after taking ciprofloxacin , which is suspected to be an adverse reaction to the antibiotic. We will avoid using ciprofloxacin  in the future.  INSTRUCTIONS:  Please discuss scheduling options with the pulmonary rehabilitation team. We will reassess your condition in four months.

## 2024-02-14 NOTE — Progress Notes (Signed)
 Pre/Post spirometry done today per MD.

## 2024-02-14 NOTE — Patient Instructions (Signed)
 Pre/Post spirometry done today.

## 2024-02-14 NOTE — Progress Notes (Signed)
 Subjective:    Patient ID: Holly Werner, female    DOB: 1951-04-01, 73 y.o.   MRN: [id number]  Patient Care Team: Marikay Eva POUR, PA as PCP - General (Physician Assistant) Verdene Gills, RN as Registered Nurse Tamea Dedra CROME, MD as Consulting Physician (Pulmonary Disease) Bensimhon, Toribio SAUNDERS, MD as Consulting Physician (Cardiology)  Chief Complaint  Patient presents with   Follow-up    Reports side effects to Ohtuvayre . Reports using only once a day for one week.     BACKGROUND/INTERVAL:Patient is a 73 year old former smoker with a 38-pack-year history of smoking history as noted below, who presents for follow-up on the issue of severe COPD on the basis of emphysema and chronic hypoxic respiratory failure.  She previously followed with [facility] and established care here on 26 April 2022.  She was last seen by me on 14 January 2024, she presents today for follow-up.   HPI Discussed the use of AI scribe software for clinical note transcription with the patient, who gave verbal consent to proceed.  History of Present Illness   Holly Werner is a 73 year old female with severe COPD who presents for follow-up.  She manages her severe COPD, primarily due to pulmonary emphysema, with inhalers but has experienced difficulties with Ohtuvayre  prescribed at her last visit. Over the past five weeks, her oxygen saturation has declined, dropping to 85% after 30 minutes without supplemental oxygen, whereas previously it would remain at 92% for hours.  She has noted more dyspnea.  Her oxygen levels are typically 96-97% when resting.  She had CT angio chest after her recent visit which was negative for PE.  She does have SEVERE centrilobular emphysema.  She experiences migratory joint pain affecting her back, fingers, and thumb, with each joint experiencing pain for about two days before it moves to another location. She is uncertain if this is related to her COPD treatment.  This may be  related to polyarthropathy.  She suspects intolerance to ciprofloxacin , which made her feel very sick, though she did not vomit. She has a history of undergoing CT scans with contrast dye without previous issues, suggesting the reaction may be related to the antibiotic rather than the dye.  She has not received flu or pneumonia vaccinations and reports never having taken any injections. Her mother had an adverse reaction to an injection years ago. She cares for her sister, which impacts her availability for medical appointments.   Pulmonary function testing was attempted today she was only able to do spirometry which did not show change from prior.  Her main issue is with emphysema and therefore DLCO would be the preferred test however, she was not able to perform DLCO study due to being saturating 85% on room air and needing oxygen to be reinstated.    DATA 04/26/2022 Alpha 1: MM phenotype, 173 mg/dl level 87/78/7976 PFT: FEV1 2.07 L or 97% predicted, FVC 2.34 L or 82% of predicted, FEV1/FVC 89%, no bronchodilator response.  Lung volumes overall normal.  Overall no restriction, diffusion capacity severely impaired. 06/16/2022 sleep study: No evidence of sleep apnea, the patient did exhibit significant oxygen desaturations related to pulmonary disease. 06/28/2022 echocardiogram: LVEF 60 to 65%, moderately elevated pulmonary artery systolic pressure, RV systolic pressure calculated at 57.6 mmHg.  Moderate mitral valve regurgitation, moderate tricuspid valve regurgitation. 01/15/2024 CT angio chest: Redemonstration of chronically thrombosed right upper lobe distal lobar and segmental pulmonary arteries, similar to prior study.  No acute embolism seen.  Dilation of bilateral main pulmonary arteries.  Thyroid nodule 10 x 19 mm.  Severe centrilobular emphysematous changes throughout bilateral lungs.  Stable right apical pleural-parenchymal disease.  Stable 5 x 8 mm subpleural nodule in the right lower  lobe. 02/14/2024, spirometry: FEV1 2.17 L or 107% predicted, FVC 2.45 L or 90% predicted, FEV1/FVC 89%, normal spirometry.  Review of Systems A 10 point review of systems was performed and it is as noted above otherwise negative.   Patient Active Problem List   Diagnosis Date Noted   Diastolic heart failure (HCC) 06/05/2022   Pulmonary embolism (HCC) 06/05/2022   Chronic respiratory failure with hypoxia (HCC) 06/05/2022   Tobacco abuse 03/07/2022   Swelling of lower extremity    Pulmonary hypertension, unspecified (HCC)    Chronic kidney disease, stage 3a (HCC) 03/06/2022   Acute heart failure (HCC) 03/06/2022   Acute respiratory failure with hypoxia (HCC) 03/06/2022   Cervical spine arthritis 12/05/2016   COPD (chronic obstructive pulmonary disease) (HCC) 12/05/2016   GERD (gastroesophageal reflux disease) 12/05/2016   IBS (irritable bowel syndrome) 12/05/2016   Nephrolithiasis 12/05/2016   Premature menopause 12/05/2016   Pulmonary nodule, right 12/05/2016   Appendicitis 12/05/2016   Acute appendicitis with localized peritonitis 12/05/2016   Acute appendicitis 12/03/2016   Personal history of tobacco use, presenting hazards to health 10/09/2016   Cervicalgia 10/28/2014   Myofascial muscle pain 10/28/2014   Neck pain 10/28/2014    Social History   Tobacco Use   Smoking status: Former    Current packs/day: 0.00    Average packs/day: 0.8 packs/day for 51.0 years (38.3 ttl pk-yrs)    Types: Cigarettes    Start date: 03/07/1971    Quit date: 03/06/2022    Years since quitting: 1.9   Smokeless tobacco: Never  Substance Use Topics   Alcohol use: No    Alcohol/week: 0.0 standard drinks of alcohol    Allergies  Allergen Reactions   Escitalopram Oxalate Palpitations    Loose stools   Amoxicillin -Pot Clavulanate     Other reaction(s): Vomiting   Aspirin Other (See Comments)    Stomach hurt   Codeine Other (See Comments)    stomach hurt   Dicyclomine Other (See  Comments)    Dizziness and a Drunk feeling   Doxycycline Swelling   Levofloxacin     Other reaction(s): Dizziness   Lipitor [Atorvastatin]     Eye pain   Sertraline Other (See Comments)    Sleepy    Current Meds  Medication Sig   acetaminophen  (TYLENOL ) 500 MG tablet Take 500 mg by mouth as needed.   chlorpheniramine-HYDROcodone  (TUSSIONEX) 10-8 MG/5ML Take 5 mLs by mouth as needed for cough.   diazepam  (VALIUM ) 5 MG tablet Take 2.5 mg by mouth 2 (two) times daily as needed for anxiety.   ELIQUIS  2.5 MG TABS tablet TAKE 1 TABLET(2.5 MG) BY MOUTH TWICE DAILY   furosemide  (LASIX ) 40 MG tablet TAKE 1 TABLET(40 MG) BY MOUTH DAILY   HYDROcodone -acetaminophen  (NORCO/VICODIN) 5-325 MG tablet Take 1 tablet by mouth every 6 (six) hours as needed.   Multiple Vitamins-Iron (MULTIVITAMIN/IRON PO) Take 1 capsule by mouth daily.   OXYGEN Inhale 1.5-2 L into the lungs continuous.   spironolactone  (ALDACTONE ) 25 MG tablet Take 0.5 tablets (12.5 mg total) by mouth daily.   Tiotropium Bromide-Olodaterol (STIOLTO RESPIMAT ) 2.5-2.5 MCG/ACT AERS Inhale 2 puffs into the lungs daily.     There is no immunization history on file for this patient.  Objective:     BP 120/60   Pulse 73   Temp 97.6 F (36.4 C) (Temporal)   Ht 5' 3 (1.6 m)   Wt 120 lb 12.8 oz (54.8 kg)   SpO2 91% Comment: 2L oxygen  BMI 21.40 kg/m   SpO2: 91 % (2L oxygen)  GENERAL: Thin well-developed woman, no acute distress, fully ambulatory.  No conversational dyspnea.  Using oxygen via nasal cannula. HEAD: Normocephalic, atraumatic.  EYES: Pupils equal, round, reactive to light.  No scleral icterus.  MOUTH: Oral mucosa moist.  No thrush.  Mallampati II airway NECK: Supple. No thyromegaly. Trachea midline. No JVD.  No adenopathy. PULMONARY: Distant breath sounds, symmetrical air entry bilaterally.  No adventitious sounds. CARDIOVASCULAR: S1 and S2. Regular rate and rhythm.  No rubs, murmurs or gallops  heard. ABDOMEN: Benign. MUSCULOSKELETAL: No joint deformity, no clubbing, no edema.  NEUROLOGIC: No overt focal deficit, no gait disturbance, speech is fluent.  No asterixis. SKIN: Intact,warm,dry. PSYCH: Mood and behavior normal.  Recent Results (from the past 2160 hours)  D-Dimer, Quantitative     Status: Abnormal   Collection Time: 01/14/24  5:14 PM  Result Value Ref Range   D-Dimer, Quant 2.22 (H) 0.00 - 0.50 ug/mL-FEU    Comment: (NOTE) At the manufacturer cut-off value of 0.5 g/mL FEU, this assay has a negative predictive value of 95-100%.This assay is intended for use in conjunction with a clinical pretest probability (PTP) assessment model to exclude pulmonary embolism (PE) and deep venous thrombosis (DVT) in outpatients suspected of PE or DVT. Results should be correlated with clinical presentation. Performed at [facility], [address] [place], [place] [address]   Pulmonary function test     Status: None   Collection Time: 02/14/24 10:51 AM  Result Value Ref Range   FVC-Pre 2.45 L   FVC-%Pred-Pre 90 %   FVC-Post 2.45 L   FVC-%Pred-Post 90 %   FVC-%Change-Post 0 %   FEV1-Pre 2.17 L   FEV1-%Pred-Pre 107 %   FEV1-Post 2.18 L   FEV1-%Pred-Post 108 %   FEV1-%Change-Post 0 %   FEV6-Pre 2.45 L   FEV6-%Pred-Pre 95 %   FEV6-Post 2.45 L   FEV6-%Pred-Post 95 %   FEV6-%Change-Post 0 %   Pre FEV1/FVC ratio 89 %   FEV1FVC-%Pred-Pre 118 %   Post FEV1/FVC ratio 89 %   FEV1FVC-%Change-Post 0 %   Pre FEV6/FVC Ratio 100 %   FEV6FVC-%Pred-Pre 105 %   Post FEV6/FVC ratio 100 %   FEV6FVC-%Pred-Post 105 %   FEF 25-75 Pre 2.57 L/sec   FEF2575-%Pred-Pre 160 %   FEF 25-75 Post 2.74 L/sec   FEF2575-%Pred-Post 170 %   FEF2575-%Change-Post 6 %    Assessment & Plan:     ICD-10-CM   1. Centrilobular emphysema (HCC) - SEVERE  J43.2 AMB referral to pulmonary rehabilitation    2. Chronic respiratory failure with hypoxia (HCC)  J96.11 AMB referral to pulmonary  rehabilitation    3. Pulmonary hypertension, unspecified (HCC)  I27.20     4. Shortness of breath  R06.02       Orders Placed This Encounter  Procedures   AMB referral to pulmonary rehabilitation    Referral Priority:   Routine    Referral Type:   Consultation    Referral Reason:   Specialty Services Required    Number of Visits Requested:   1   Discussion:    Severe chronic obstructive pulmonary disease with emphysema Severe COPD with emphysema affecting the lung's oxygen  exchange capacity. Oxygen saturation has decreased over the past five weeks, dropping to 85% after 30 minutes without supplemental oxygen. Pulmonary function tests were limited due to low oxygen levels. Prednisone has not significantly improved symptoms. - Refer to pulmonary rehabilitation program to improve respiratory function and quality of life. - Patient to discuss scheduling options with pulmonary rehabilitation team. - Reassess in 3 to 4 months.  Adverse reaction to ciprofloxacin  Reported adverse reaction to ciprofloxacin , characterized by significant gastrointestinal discomfort without emesis. The reaction is suspected to be due to ciprofloxacin  rather than contrast dye, as previous exposures to dye did not cause similar symptoms.    Advised if symptoms do not improve or worsen, to please contact office for sooner follow up or seek emergency care.    I spent 32 minutes of dedicated to the care of this patient on the date of this encounter to include pre-visit review of records, face-to-face time with the patient discussing conditions above, post visit ordering of testing, clinical documentation with the electronic health record, making appropriate referrals as documented, and communicating necessary findings to members of the patients care team.     C. Leita Sanders, MD Advanced Bronchoscopy PCCM [facility]    *This note was generated using voice recognition software/Dragon and/or  AI transcription program.  Despite best efforts to proofread, errors can occur which can change the meaning. Any transcriptional errors that result from this process are unintentional and may not be fully corrected at the time of dictation.

## 2024-02-21 ENCOUNTER — Telehealth (HOSPITAL_COMMUNITY): Payer: Self-pay | Admitting: *Deleted

## 2024-02-21 NOTE — Telephone Encounter (Signed)
 Attempted to call patient regarding upcoming cardiac CT appointment. Left message on voicemail with name and callback number Sid Seats RN Navigator Cardiac Imaging [facility] [phone] Office

## 2024-02-21 NOTE — Telephone Encounter (Signed)
 Reaching out to patient to offer assistance regarding upcoming cardiac imaging study; pt verbalizes understanding of appt date/time, parking situation and where to check in, pre-test NPO status and medications ordered, and verified current allergies; name and call back number provided for further questions should they arise Sid Seats RN Navigator Cardiac Imaging [facility] [phone] office [phone] cell

## 2024-02-24 ENCOUNTER — Ambulatory Visit
Admission: RE | Admit: 2024-02-24 | Discharge: 2024-02-24 | Disposition: A | Discharge status: Home / Self Care | Source: Ambulatory Visit | Attending: Internal Medicine | Admitting: Internal Medicine

## 2024-02-24 DIAGNOSIS — I25118 Atherosclerotic heart disease of native coronary artery with other forms of angina pectoris: Secondary | ICD-10-CM | POA: Insufficient documentation

## 2024-02-24 LAB — POCT I-STAT CREATININE: Creatinine, Ser: 1 mg/dL (ref 0.44–1.00)

## 2024-02-24 MED ORDER — NITROGLYCERIN 0.4 MG SL SUBL
0.8000 mg | SUBLINGUAL_TABLET | Freq: Once | SUBLINGUAL | Status: AC
  Administered 2024-02-24: 0.8 mg via SUBLINGUAL
  Filled 2024-02-24: qty 25

## 2024-02-24 MED ORDER — METOPROLOL TARTRATE 5 MG/5ML IV SOLN
INTRAVENOUS | Status: AC
  Filled 2024-02-24: qty 5

## 2024-02-24 MED ORDER — IOHEXOL 350 MG/ML SOLN
100.0000 mL | Freq: Once | INTRAVENOUS | Status: AC | PRN
  Administered 2024-02-24: 100 mL via INTRAVENOUS

## 2024-03-09 ENCOUNTER — Ambulatory Visit (HOSPITAL_COMMUNITY): Payer: Self-pay | Admitting: Internal Medicine

## 2024-03-12 MED ORDER — ROSUVASTATIN CALCIUM 5 MG PO TABS
5.0000 mg | ORAL_TABLET | Freq: Every day | ORAL | 3 refills | Status: AC
Start: 2024-03-12 — End: 2024-06-10

## 2024-03-12 NOTE — Telephone Encounter (Addendum)
 Pt aware, agreeable, and verbalized understanding  Rx sent into pharmacy  ----- Message from Toribio Fuel sent at 03/09/2024  9:58 AM EDT ----- Minimal CAD. Start crestor 5 ----- Message ----- From: Interface, Rad Results In Sent: 02/24/2024   1:19 PM EDT To: Toribio JONELLE Fuel, MD

## 2024-03-27 ENCOUNTER — Telehealth: Payer: Self-pay | Admitting: Internal Medicine

## 2024-03-27 NOTE — Telephone Encounter (Signed)
 Called to confirm/remind patient of their appointment at [facility] on 03/30/24.   Appointment:   [x] Confirmed  [] Left mess   [] No answer/No voice mail  [] VM Full/unable to leave message  [] Phone not in service  Patient reminded to bring all medications and/or complete list.  Confirmed patient has transportation. Gave directions, instructed to utilize valet parking.

## 2024-03-30 ENCOUNTER — Ambulatory Visit: Attending: Internal Medicine | Admitting: Internal Medicine

## 2024-03-30 VITALS — BP 106/58 | HR 75 | Wt 120.5 lb

## 2024-03-30 DIAGNOSIS — Z79899 Other long term (current) drug therapy: Secondary | ICD-10-CM | POA: Insufficient documentation

## 2024-03-30 DIAGNOSIS — I25118 Atherosclerotic heart disease of native coronary artery with other forms of angina pectoris: Secondary | ICD-10-CM | POA: Diagnosis not present

## 2024-03-30 DIAGNOSIS — I5032 Chronic diastolic (congestive) heart failure: Secondary | ICD-10-CM | POA: Diagnosis present

## 2024-03-30 DIAGNOSIS — Z7901 Long term (current) use of anticoagulants: Secondary | ICD-10-CM | POA: Insufficient documentation

## 2024-03-30 DIAGNOSIS — I272 Pulmonary hypertension, unspecified: Secondary | ICD-10-CM | POA: Diagnosis not present

## 2024-03-30 DIAGNOSIS — N189 Chronic kidney disease, unspecified: Secondary | ICD-10-CM | POA: Diagnosis not present

## 2024-03-30 DIAGNOSIS — I714 Abdominal aortic aneurysm, without rupture, unspecified: Secondary | ICD-10-CM | POA: Insufficient documentation

## 2024-03-30 DIAGNOSIS — Z720 Tobacco use: Secondary | ICD-10-CM

## 2024-03-30 DIAGNOSIS — I2781 Cor pulmonale (chronic): Secondary | ICD-10-CM | POA: Diagnosis not present

## 2024-03-30 DIAGNOSIS — I2510 Atherosclerotic heart disease of native coronary artery without angina pectoris: Secondary | ICD-10-CM | POA: Insufficient documentation

## 2024-03-30 DIAGNOSIS — Z87891 Personal history of nicotine dependence: Secondary | ICD-10-CM | POA: Diagnosis not present

## 2024-03-30 DIAGNOSIS — I270 Primary pulmonary hypertension: Secondary | ICD-10-CM | POA: Diagnosis not present

## 2024-03-30 DIAGNOSIS — J449 Chronic obstructive pulmonary disease, unspecified: Secondary | ICD-10-CM | POA: Diagnosis not present

## 2024-03-30 DIAGNOSIS — R079 Chest pain, unspecified: Secondary | ICD-10-CM | POA: Insufficient documentation

## 2024-03-30 DIAGNOSIS — J439 Emphysema, unspecified: Secondary | ICD-10-CM | POA: Insufficient documentation

## 2024-03-30 MED ORDER — PANTOPRAZOLE SODIUM 40 MG PO TBEC: 40.0000 mg | DELAYED_RELEASE_TABLET | Freq: Every day | ORAL | 0 refills | Status: DC

## 2024-03-30 NOTE — Addendum Note (Signed)
 Addended by: SHARL GRATE A on: 03/30/2024 03:38 PM   Modules accepted: Orders

## 2024-03-30 NOTE — Progress Notes (Signed)
 [facility] HF CLINIC NOTE  Referring Physician: Dr. Darliss Primary Care: Marikay Eva [place], [place] Primary Cardiologist: Dr. Darliss  HPI:  Holly Werner is a 73 y/o female with a history of CKD, severe COPD (> 50 pack years), previous tobacco use. Diagnosed with diastolic HF and PAH in the hospital in 10/23. Referred for further evalation of PAH.    Admitted 03/06/22 due to several day history of shortness of breath on exertion and leg swelling. Sats in 80s on admit. Chest CTA negative for PE but does show severe emphysema and pulmonary HTN. Cardiology & Pulmonology consults obtained. Did need oxygen at discharge. IV lasix  provided and transitioned to oral diuretics. Discharged after 3 days. Started lasix  40 daily and sildenafil  20 tid.   Echo 10/23 EF 60-65% G1DD. Flattened septum. RV markedly dilated. Moderately HK  RVSP 70-31mmHG  PH serologies + for RF all others negative  Seen in Pulmonary Clinic. (Dr. Aleskarov). CT angio 04/11/22. 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 Severe COPD.   Sleep study 06/20/22 AHI 3.9 lowest sat 83% (O2 increased to 3L at night)  Echo 06/28/22 EF 60-65% RV mildly dilated normal function  RVSP  Sleep study AHI 3.9 Sats as low as 83%   PFTs 05/17/22 FEV1 2.07 (97%) FVC 2.34 (82%) DLCO 18%  Echo 03/26/23: EF 55-60% RV ok TR insufficient to estimate RVSP. 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   Here with her son for routine f/u. Recently started new nebulizer with Dr. Lenda and developed severe diffuse arthritis. Now back on prednisone. Coronary CTA looked good. Tolerating Crestor. No CP.    Past Medical History:  Diagnosis Date   CHF (congestive heart failure) (HCC)    Chronic hypoxic respiratory failure, on home oxygen therapy (HCC)    Chronic kidney disease    Chronic pain    Emphysema lung (HCC)    Family history of adverse reaction to anesthesia    Son - Propofol  - increased BP and HR, aggitation    GERD (gastroesophageal reflux disease)    Hx of pulmonary embolus    Moderate mitral valve regurgitation    Moderate tricuspid regurgitation by prior echocardiogram    Pulmonary HTN (HCC)    Thoracic outlet syndrome     Current Outpatient Medications  Medication Sig Dispense Refill   acetaminophen  (TYLENOL ) 500 MG tablet Take 500 mg by mouth as needed.     chlorpheniramine-HYDROcodone  (TUSSIONEX) 10-8 MG/5ML Take 5 mLs by mouth as needed for cough.     diazepam  (VALIUM ) 5 MG tablet Take 2.5 mg by mouth 2 (two) times daily as needed for anxiety.     ELIQUIS  2.5 MG TABS tablet TAKE 1 TABLET(2.5 MG) BY MOUTH TWICE DAILY 60 tablet 3   furosemide  (LASIX ) 40 MG tablet TAKE 1 TABLET(40 MG) BY MOUTH DAILY 90 tablet 3   HYDROcodone -acetaminophen  (NORCO/VICODIN) 5-325 MG tablet Take 1 tablet by mouth every 6 (six) hours as needed.     Multiple Vitamins-Iron (MULTIVITAMIN/IRON PO) Take 1 capsule by mouth daily.     OXYGEN Inhale 1.5-2 L into the lungs continuous.     predniSONE (DELTASONE) 10 MG tablet Take 5 mg by mouth in the morning and at bedtime.     rosuvastatin (CRESTOR) 5 MG tablet Take 1 tablet (5 mg total) by mouth daily. 90 tablet 3   spironolactone  (ALDACTONE ) 25 MG tablet Take 0.5 tablets (12.5 mg total) by mouth daily. 45 tablet 3   Tiotropium Bromide-Olodaterol (STIOLTO  RESPIMAT) 2.5-2.5 MCG/ACT AERS Inhale 2 puffs into the lungs daily. 4 g 11   No current facility-administered medications for this visit.    Allergies  Allergen Reactions   Escitalopram Oxalate Palpitations    Loose stools   Amoxicillin -Pot Clavulanate     Other reaction(s): Vomiting   Aspirin Other (See Comments)    Stomach hurt   Cipro  [Ciprofloxacin  Hcl]    Codeine Other (See Comments)    stomach hurt   Dicyclomine Other (See Comments)    Dizziness and a Drunk feeling   Doxycycline Swelling   Levofloxacin     Other reaction(s): Dizziness   Lipitor [Atorvastatin]     Eye pain   Ohtuvayre   [Ensifentrine ] Other (See Comments)    Joint pain   Sertraline Other (See Comments)    Sleepy      Social History   Socioeconomic History   Marital status: Widowed    Spouse name: Not on file   Number of children: Not on file   Years of education: Not on file   Highest education level: Not on file  Occupational History   Occupation: retired  Tobacco Use   Smoking status: Former    Current packs/day: 0.00    Average packs/day: 0.8 packs/day for 51.0 years (38.3 ttl pk-yrs)    Types: Cigarettes    Start date: 03/07/1971    Quit date: 03/06/2022    Years since quitting: 2.0   Smokeless tobacco: Never  Vaping Use   Vaping status: Never Used  Substance and Sexual Activity   Alcohol use: No    Alcohol/week: 0.0 standard drinks of alcohol   Drug use: No   Sexual activity: Not on file  Other Topics Concern   Not on file  Social History Narrative   Not on file   Social Drivers of Health   Financial Resource Strain: Low Risk  (10/01/2023)   Received from [facility]   Overall Financial Resource Strain (CARDIA)    Difficulty of Paying Living Expenses: Not hard at all  Food Insecurity: No Food Insecurity (10/01/2023)   Received from [facility]   Hunger Vital Sign    Within the past 12 months, you worried that your food would run out before you got the money to buy more.: Never true    Within the past 12 months, the food you bought just didn't last and you didn't have money to get more.: Never true  Transportation Needs: No Transportation Needs (10/01/2023)   Received from [facility] - Transportation    In the past 12 months, has lack of transportation kept you from medical appointments or from getting medications?: No    Lack of Transportation (Non-Medical): No  Physical Activity: Not on file  Stress: Not on file  Social Connections: Not on file  Intimate Partner Violence: Not At Risk (03/07/2022)    Humiliation, Afraid, Rape, and Kick questionnaire    Fear of Current or Ex-Partner: No    Emotionally Abused: No    Physically Abused: No    Sexually Abused: No      Family History  Problem Relation Age of Onset   Vision loss Mother    Macular degeneration Mother    Dementia Mother    Kidney disease Father    COPD Sister    Uterine cancer Sister    Lung cancer Maternal Aunt    COPD Sister    Colon cancer Maternal Aunt  Ovarian cancer Maternal Aunt      PHYSICAL EXAM: Vitals:   03/30/24 1420  BP: (!) 106/58  Pulse: 75  SpO2: 92%    Wt Readings from Last 3 Encounters:  03/30/24 120 lb 8 oz (54.7 kg)  02/14/24 120 lb 12.8 oz (54.8 kg)  02/14/24 120 lb 12.8 oz (54.8 kg)   Physical exam: General:  Thin elderly  No resp difficulty HEENT: normal Neck: supple. no JVD.  Cor: Regular rate & rhythm. No rubs, gallops or murmurs. Lungs: decreased throughout Abdomen: soft, nontender, nondistended.Good bowel sounds. Extremities: no cyanosis, clubbing, rash, edema Neuro: alert & orientedx3, cranial nerves grossly intact. moves all 4 extremities w/o difficulty. Affect pleasant   ASSESSMENT & PLAN:   1. CAD with chest pain/pressure - multiple CRFs with coronary calcifications on CT - CTA 9/25 very mild CAD - No angina - Toelrating Crestor   2. PAH with cor pulmonale - Echo 10/23 EF 60-65% G1DD. Flattened septum. RV markedly dilated. Moderately HK  RVSP 70-75mmHG - CT chest 10/23. No PE. Very severe emphysema - CT angio 04/11/22. 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 Severe COPD.  - WHO Group II PAH -much improved with starting O2 supplementation and smoking cessation  - PFTs with normal spirometry with markedly reduced DLCO (18%) - unclear to me how spirometry is normal based on the amount of parenchymal lung disease on CT - Auto-immune serologies negative x for + RF (non-specific). CCP negative - Echo 06/28/22 EF 60-65% RV mildly dilated normal function (much improved from previous)  RVSP 58 - Echo 03/26/23: EF 55-60% RV ok TR insufficient to estimate RVSP   RV function much improved on last echo. - NYHA II-III predominantly due to COPD - Volume status OK - Given severity of lung disease not candidate for selective pulmonary vasodilators due to risk for shunt - We have not done RHC given clinical improvement with O2. Can consider as needed -> no need at this point  3, Small RUL PE  - CT chest 10/23 small PE but cannot exclude angiosarcoma (less likely) - F/u CT 2/24 partial recannalization of small PE - stable on CT 01/15/24 - likely not big enough to affect PAH - On apixaban  2.5 bid per Ampilfy-EXT trial. No bleeding. Lef-long therapy  - LE Dopplers negative 12/23  4. Chronic heart failure with preserved ejection fraction  - BNP 03/06/22 was 1100.6 - This is mostly RV failure from [facility] - Volume ok. Continue spiro (not on SGLT2 with UTis)  5. Severe COPD- - Follows with pulmonology [place]) - Remains on 2-3LNC - No change   6. Tobacco use- - stopped smoking 10/23 - had been smoking since the age of 73 - Remains quit   7. Small AAA - 2.9 cm on incidental screen 2/25 - repeat in 1 year    Toribio Fuel, MD  3:13 PM

## 2024-03-30 NOTE — Patient Instructions (Addendum)
 Medication Changes:  START Protonix 40mg  (1 tab) daily   Follow-Up in: Please follow up with the [facility] in 9 months with Dr. Bensimhon. We do not have that schedule. We do not have that schedule. Please give us  a call in July in order to schedule your appointment for August.    Thank you for choosing [facility].    At the [facility], you and your health needs are our priority. We have a designated team specialized in the treatment of Heart Failure. This Care Team includes your primary Heart Failure Specialized Cardiologist (physician), Advanced Practice Providers (APPs- Physician Assistants and Nurse Practitioners), and Pharmacist who all work together to provide you with the care you need, when you need it.   You may see any of the following providers on your designated Care Team at your next follow up:  Dr. Toribio Fuel Dr. Ezra Shuck Dr. Ria Commander Dr. Morene Brownie Ellouise Class, FNP Jaun Bash, RPH-CPP  Please be sure to bring in all your medications bottles to every appointment.   Need to Contact Us :  If you have any questions or concerns before your next appointment please send us  a message through [place] or call our office at [phone].    TO LEAVE A MESSAGE FOR THE NURSE SELECT OPTION 2, PLEASE LEAVE A MESSAGE INCLUDING: YOUR NAME DATE OF BIRTH CALL BACK NUMBER REASON FOR CALL**this is important as we prioritize the call backs  YOU WILL RECEIVE A CALL BACK THE SAME DAY AS LONG AS YOU CALL BEFORE 4:00 PM

## 2024-04-28 ENCOUNTER — Other Ambulatory Visit: Payer: Self-pay | Admitting: Pulmonary Disease

## 2024-04-28 NOTE — Telephone Encounter (Signed)
 Ok to renew?

## 2024-04-28 NOTE — Progress Notes (Signed)
 " Chief Complaint  Patient presents with   Hospital Follow Up    Subjective  Holly Werner is a 73 y.o. female who presents for Hospital Follow Up HPI History of Present Illness Holly Werner is a 74 year old female with rheumatoid arthritis who presents with hand pain and swelling.  Hand pain and swelling - Pain and swelling in both hands - Symptoms managed with prednisone 5 mg in the morning and sometimes at night - Without prednisone, pain recurs every 16 to 18 hours - Experiences a 3-hour period of pain before prednisone takes effect - No pain today  Glucose abnormalities - History of prediabetes - Glucose levels are low despite intake of a cup of coffee and a cracker  Rheumatologic laboratory findings - Rheumatoid factor previously reported as high  -COPD - On chronic O2.  -Using inhalers as prescribed.  -No recent exacerbation.   Review of Systems Per hPI  Patient Active Problem List  Diagnosis   COPD (chronic obstructive pulmonary disease) (CMS/HHS-HCC)   GERD (gastroesophageal reflux disease)   Nephrolithiasis   IBS (irritable bowel syndrome)   Pulmonary nodule, right   Premature menopause   Cervical spine arthritis   Cervicalgia   Prediabetes   Acute appendicitis   Acute appendicitis with localized peritonitis   Acute heart failure (CMS/HHS-HCC)   Diastolic heart failure (CMS/HHS-HCC)   Acute respiratory failure with hypoxia (CMS/HHS-HCC)   Chronic kidney disease, stage 3a (CMS-HCC)   Chronic respiratory failure with hypoxia (CMS/HHS-HCC)   Myofascial muscle pain   Personal history of tobacco use, presenting hazards to health   Tobacco abuse   Pulmonary embolism (CMS/HHS-HCC)   Pulmonary hypertension, unspecified (CMS/HHS-HCC)   Swelling of lower extremity     Current Outpatient Medications:    acetaminophen  (TYLENOL ) 500 MG tablet, Take 500 mg by mouth, Disp: , Rfl:    apixaban  (ELIQUIS ) 5 mg tablet, Take 5 mg by mouth 2 (two)  times daily, Disp: , Rfl:    diazepam  (VALIUM ) 5 MG tablet, Take 5 mg by mouth as needed.  , Disp: , Rfl:    FUROsemide  (LASIX ) 40 MG tablet, Take 40 mg by mouth once daily, Disp: , Rfl:    HYDROcodone -chlorpheniramine (TUSSIONEX) 10-8 mg/5 mL ER suspension, Take 5 mLs by mouth every 12 (twelve) hours as needed for Cough, Disp: 120 mL, Rfl: 0   pantoprazole  (PROTONIX ) 40 MG DR tablet, Take 40 mg by mouth once daily, Disp: , Rfl:    predniSONE (DELTASONE) 10 MG tablet, 6 po day one, decrease by 10mg  qd til gone., Disp: 21 tablet, Rfl: 0   rosuvastatin  (CRESTOR ) 5 MG tablet, Take 5 mg by mouth once daily, Disp: , Rfl:    spironolactone  (ALDACTONE ) 25 MG tablet, Take 12.5 mg by mouth once daily, Disp: , Rfl:    STIOLTO RESPIMAT  2.5-2.5 mcg/actuation inhaler, Inhale 2 inhalations into the lungs once daily, Disp: , Rfl:    triamcinolone 0.1 % cream, Apply topically 2 (two) times daily, Disp: 30 g, Rfl: 0      Objective  Vitals:   04/28/24 1303  BP: (!) 150/50  Pulse: 89  SpO2: 96%  Weight: 55.4 kg (122 lb 3.2 oz)  Height: 157.5 cm (5' 2)  PainSc: 0-No pain   Body mass index is 22.35 kg/m.  Home Vitals:     Physical Exam Physical Exam Wt Readings from Last 3 Encounters:  04/28/24 55.4 kg (122 lb 3.2 oz)  03/11/24 54 kg (119 lb)  01/10/24 55.2 kg (121  lb 12.8 oz)   Constitutional: alert, in NAD, and communicates well Results Appointment on 04/21/2024  Component Date Value Ref Range Status   WBC (White Blood Cell Count) 04/21/2024 8.4  4.1 - 10.2 103/uL Final   RBC (Red Blood Cell Count) 04/21/2024 4.69  4.04 - 5.48 106/uL Final   Hemoglobin 04/21/2024 14.6  12.0 - 15.0 gm/dL Final   Hematocrit 88/74/7974 44.9  35.0 - 47.0 % Final   MCV (Mean Corpuscular Volume) 04/21/2024 95.7  80.0 - 100.0 fl Final   MCH (Mean Corpuscular Hemoglobin) 04/21/2024 31.1  27.0 - 31.2 pg Final   MCHC (Mean Corpuscular Hemoglobin * 04/21/2024 32.5  32.0 - 36.0 gm/dL Final    Platelet Count 04/21/2024 211  150 - 450 103/uL Final   RDW-CV (Red Cell Distribution Widt* 04/21/2024 13.5  11.6 - 14.8 % Final   MPV (Mean Platelet Volume) 04/21/2024 11.7  9.4 - 12.4 fl Final   Neutrophils 04/21/2024 6.22  1.50 - 7.80 103/uL Final   Lymphocytes 04/21/2024 1.09  1.00 - 3.60 103/uL Final   Monocytes 04/21/2024 0.86  0.00 - 1.50 103/uL Final   Eosinophils 04/21/2024 0.15  0.00 - 0.55 103/uL Final   Basophils 04/21/2024 0.05  0.00 - 0.09 103/uL Final   Neutrophil % 04/21/2024 74.0 (H)  32.0 - 70.0 % Final   Lymphocyte % 04/21/2024 13.0  10.0 - 50.0 % Final   Monocyte % 04/21/2024 10.2  4.0 - 13.0 % Final   Eosinophil % 04/21/2024 1.8  1.0 - 5.0 % Final   Basophil% 04/21/2024 0.6  0.0 - 2.0 % Final   Immature Granulocyte % 04/21/2024 0.4  <=0.7 % Final   Immature Granulocyte Count 04/21/2024 0.03  <=0.06 10^3/L Final   Glucose 04/21/2024 87  70 - 110 mg/dL Final   Sodium 88/74/7974 142  136 - 145 mmol/L Final   Potassium 04/21/2024 4.0  3.6 - 5.1 mmol/L Final   Chloride 04/21/2024 102  97 - 109 mmol/L Final   Carbon Dioxide (CO2) 04/21/2024 33.0 (H)  22.0 - 32.0 mmol/L Final   Urea Nitrogen (BUN) 04/21/2024 24  7 - 25 mg/dL Final   Creatinine 88/74/7974 1.0  0.6 - 1.1 mg/dL Final   Glomerular Filtration Rate (eGFR) 04/21/2024 59 (L)  >60 mL/min/1.73sq m Final   Calcium  04/21/2024 10.1  8.7 - 10.3 mg/dL Final   AST  88/74/7974 19  8 - 39 U/L Final   ALT  04/21/2024 14  5 - 38 U/L Final   Alk Phos (alkaline Phosphatase) 04/21/2024 42  34 - 104 U/L Final   Albumin 04/21/2024 4.2  3.5 - 4.8 g/dL Final   Bilirubin, Total 04/21/2024 0.5  0.3 - 1.2 mg/dL Final   Protein, Total 04/21/2024 6.8  6.1 - 7.9 g/dL Final   A/G Ratio 88/74/7974 1.6  1.0 - 5.0 gm/dL Final   Cholesterol, Total 04/21/2024 228 (H)  100 - 200 mg/dL Final   Triglyceride 88/74/7974 89  35 - 199 mg/dL Final   HDL (High Density Lipoprotein) Cho* 04/21/2024 102.4 (H)   35.0 - 85.0 mg/dL Final   LDL Calculated 04/21/2024 891  0 - 130 mg/dL Final   VLDL Cholesterol 04/21/2024 18  mg/dL Final   Cholesterol/HDL Ratio 04/21/2024 2.2   Final   Color 04/21/2024 Yellow  Colorless, Straw, Light Yellow, Yellow, Dark Yellow Final   Clarity 04/21/2024 Clear  Clear Final   Specific Gravity 04/21/2024 1.022  1.005 - 1.030 Final   pH, Urine 04/21/2024 6.0  5.0 - 8.0 Final   Protein, Urinalysis 04/21/2024 Negative  Negative mg/dL Final   Glucose, Urinalysis 04/21/2024 Negative  Negative mg/dL Final   Ketones, Urinalysis 04/21/2024 Negative  Negative mg/dL Final   Blood, Urinalysis 04/21/2024 Negative  Negative Final   Nitrite, Urinalysis 04/21/2024 Negative  Negative Final   Leukocyte Esterase, Urinalysis 04/21/2024 Negative  Negative Final   Bilirubin, Urinalysis 04/21/2024 Negative  Negative Final   Urobilinogen, Urinalysis 04/21/2024 0.2  0.2 - 1.0 mg/dL Final   WBC, UA 88/74/7974 1  <=5 /hpf Final   Red Blood Cells, Urinalysis 04/21/2024 <1  <=3 /hpf Final   Bacteria, Urinalysis 04/21/2024 0-5  0 - 5 /hpf Final   Squamous Epithelial Cells, Urinaly* 04/21/2024 0  /hpf Final   Hemoglobin A1C 04/21/2024 6.4 (H)  4.2 - 5.6 % Final   Average Blood Glucose (Calc) 04/21/2024 137  mg/dL Final     LABS CBC: within normal limits Erythrocyte sedimentation rate (ESR): within normal range Antinuclear antibody (ANA): negative Rheumatoid factor: elevated     Assessment/Plan:   Assessment & Plan 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 Elevated rheumatoid factor, normal ANA and sed rate. Prednisone provides temporary relief but risks include bone thinning and exacerbation of prediabetes. Hesitant to start new medications due to side effect concerns. - Continue prednisone 5 mg in the morning and as needed at night for pain. - Referred to rheumatologist for further  evaluation and management. - Discuss potential alternative treatments with rheumatologist, including methotrexate or biologics.  Prediabetes Glucose levels well-managed. Prednisone may exacerbate glucose levels.  Chronic kidney disease, stage 3a Kidney function well-managed with normal lab results.  General Health Maintenance Due for immunizations including flu, tetanus, pneumonia, and shingles vaccines. Diagnoses and all orders for this visit:  Prediabetes -     Comprehensive Metabolic Panel (CMP); Future -     Lipid Panel w/calc LDL; Future -     Hemoglobin A1C; Future  Chronic kidney disease, stage 3a (CMS-HCC)  PAH (pulmonary artery hypertension) (CMS/HHS-HCC)  Hyperlipemia, mixed -     Urinalysis w/Microscopic; Future  Arthralgia, unspecified joint -     CBC w/auto Differential (5 Part); Future  Rheumatoid factor positive  Pulmonary hypertension, moderate to severe (CMS/HHS-HCC)  Abnormal finding of blood chemistry, unspecified -     Lipid Panel w/calc LDL; Future  Other orders -     Follow up in Primary Care; Future    This visit was coded based on medical decision making (MDM).     Return in about 6 months (around 10/27/2024) for PE with labs.  Follow up in 6 months (on 10/27/2024). Follow-up     Future Labs/Procedures Expected by Expires   Follow up in Primary Care [[id number] Custom]  10/27/2024 04/28/2025   Questions:     Does this order need to be coordinated with another visit or should it be hidden from the patient portal? If yes to either, the patient will need to stop by the front desk or call to schedule.: Yes   Who is this follow-up with?: PCP   What type of follow up is needed?: Physical   What's the reason for follow up?:          Future Appointments     Date/Time Provider Department Center Visit Type   05/26/2024 1:00 PM Tobie Lady Plumb, MD [facility] C RHEUM NEW PATIENT   10/20/2024 9:45 AM [facility] C  LAB   10/27/2024 2:00 PM Marikay, Eva Hausen, PA [facility] C PHYSICAL       Patient Instructions  23mo PE with labs  VISIT SUMMARY: Today, we discussed your ongoing hand pain and swelling, which are likely due to rheumatoid arthritis. We also reviewed your glucose levels and kidney function.  YOUR PLAN: -POLYARTHRITIS WITH POSSIBLE RHEUMATOID ARTHRITIS: Your hand pain and swelling are likely due to rheumatoid arthritis, a condition where your immune system attacks your joints. You should continue taking prednisone 5 mg in the morning and as needed at night for pain relief. We have referred you to a rheumatologist to explore other treatment options, such as methotrexate or biologics, which may help manage your symptoms more effectively.  -PREDIABETES: Prediabetes means your blood sugar levels are higher than normal but not high enough to be classified as diabetes. Your glucose levels are currently well-managed, but please be aware that prednisone can affect your blood sugar levels.  -CHRONIC KIDNEY DISEASE, STAGE 3A: Chronic kidney disease stage 3a means your kidneys are moderately damaged and not working as well as they should. Your kidney function is currently well-managed with normal lab results.  -GENERAL HEALTH MAINTENANCE: You are due for several immunizations, including flu, tetanus, pneumonia, and shingles vaccines. Keeping up with these vaccines is important for your overall health.  INSTRUCTIONS: Please follow up with the rheumatologist for further evaluation and management of your rheumatoid arthritis. Continue monitoring your glucose levels and kidney function as advised. Schedule appointments for your due immunizations.  AI was used to generate this content from the visit notes. Appointment on 04/21/2024  Component Date Value Ref Range Status   WBC (White Blood Cell Count) 04/21/2024 8.4  4.1 - 10.2 103/uL Final   RBC (Red Blood Cell Count)  04/21/2024 4.69  4.04 - 5.48 106/uL Final   Hemoglobin 04/21/2024 14.6  12.0 - 15.0 gm/dL Final   Hematocrit 88/74/7974 44.9  35.0 - 47.0 % Final   MCV (Mean Corpuscular Volume) 04/21/2024 95.7  80.0 - 100.0 fl Final   MCH (Mean Corpuscular Hemoglobin) 04/21/2024 31.1  27.0 - 31.2 pg Final   MCHC (Mean Corpuscular Hemoglobin * 04/21/2024 32.5  32.0 - 36.0 gm/dL Final   Platelet Count 04/21/2024 211  150 - 450 103/uL Final   RDW-CV (Red Cell Distribution Widt* 04/21/2024 13.5  11.6 - 14.8 % Final   MPV (Mean Platelet Volume) 04/21/2024 11.7  9.4 - 12.4 fl Final   Neutrophils 04/21/2024 6.22  1.50 - 7.80 103/uL Final   Lymphocytes 04/21/2024 1.09  1.00 - 3.60 103/uL Final   Monocytes 04/21/2024 0.86  0.00 - 1.50 103/uL Final   Eosinophils 04/21/2024 0.15  0.00 - 0.55 103/uL Final   Basophils 04/21/2024 0.05  0.00 - 0.09 103/uL Final   Neutrophil % 04/21/2024 74.0 (H)  32.0 - 70.0 % Final   Lymphocyte % 04/21/2024 13.0  10.0 - 50.0 % Final   Monocyte % 04/21/2024 10.2  4.0 - 13.0 % Final   Eosinophil % 04/21/2024 1.8  1.0 - 5.0 % Final   Basophil% 04/21/2024 0.6  0.0 - 2.0 % Final   Immature Granulocyte % 04/21/2024 0.4  <=0.7 % Final   Immature Granulocyte Count 04/21/2024 0.03  <=0.06 10^3/L Final   Glucose 04/21/2024 87  70 - 110 mg/dL Final   Sodium 88/74/7974 142  136 - 145 mmol/L Final   Potassium 04/21/2024 4.0  3.6 - 5.1 mmol/L Final   Chloride 04/21/2024 102  97 - 109  mmol/L Final   Carbon Dioxide (CO2) 04/21/2024 33.0 (H)  22.0 - 32.0 mmol/L Final   Urea Nitrogen (BUN) 04/21/2024 24  7 - 25 mg/dL Final   Creatinine 88/74/7974 1.0  0.6 - 1.1 mg/dL Final   Glomerular Filtration Rate (eGFR) 04/21/2024 59 (L)  >60 mL/min/1.73sq m Final   Calcium  04/21/2024 10.1  8.7 - 10.3 mg/dL Final   AST  88/74/7974 19  8 - 39 U/L Final   ALT  04/21/2024 14  5 - 38 U/L Final   Alk Phos (alkaline Phosphatase) 04/21/2024 42  34 - 104 U/L Final   Albumin  04/21/2024 4.2  3.5 - 4.8 g/dL Final   Bilirubin, Total 04/21/2024 0.5  0.3 - 1.2 mg/dL Final   Protein, Total 04/21/2024 6.8  6.1 - 7.9 g/dL Final   A/G Ratio 88/74/7974 1.6  1.0 - 5.0 gm/dL Final   Cholesterol, Total 04/21/2024 228 (H)  100 - 200 mg/dL Final   Triglyceride 88/74/7974 89  35 - 199 mg/dL Final   HDL (High Density Lipoprotein) Cho* 04/21/2024 102.4 (H)  35.0 - 85.0 mg/dL Final   LDL Calculated 04/21/2024 891  0 - 130 mg/dL Final   VLDL Cholesterol 04/21/2024 18  mg/dL Final   Cholesterol/HDL Ratio 04/21/2024 2.2   Final   Color 04/21/2024 Yellow  Colorless, Straw, Light Yellow, Yellow, Dark Yellow Final   Clarity 04/21/2024 Clear  Clear Final   Specific Gravity 04/21/2024 1.022  1.005 - 1.030 Final   pH, Urine 04/21/2024 6.0  5.0 - 8.0 Final   Protein, Urinalysis 04/21/2024 Negative  Negative mg/dL Final   Glucose, Urinalysis 04/21/2024 Negative  Negative mg/dL Final   Ketones, Urinalysis 04/21/2024 Negative  Negative mg/dL Final   Blood, Urinalysis 04/21/2024 Negative  Negative Final   Nitrite, Urinalysis 04/21/2024 Negative  Negative Final   Leukocyte Esterase, Urinalysis 04/21/2024 Negative  Negative Final   Bilirubin, Urinalysis 04/21/2024 Negative  Negative Final   Urobilinogen, Urinalysis 04/21/2024 0.2  0.2 - 1.0 mg/dL Final   WBC, UA 88/74/7974 1  <=5 /hpf Final   Red Blood Cells, Urinalysis 04/21/2024 <1  <=3 /hpf Final   Bacteria, Urinalysis 04/21/2024 0-5  0 - 5 /hpf Final   Squamous Epithelial Cells, Urinaly* 04/21/2024 0  /hpf Final   Hemoglobin A1C 04/21/2024 6.4 (H)  4.2 - 5.6 % Final   Average Blood Glucose (Calc) 04/21/2024 137  mg/dL Final     An after visit summary was provided for the patient either in written format (printed) or through My [facility].  This note has been created using automated tools and reviewed for accuracy by MIRIAM KLEM [employer].  Eva Crimes, PA-C   "

## 2024-05-18 ENCOUNTER — Telehealth: Payer: Self-pay | Admitting: Internal Medicine

## 2024-05-18 NOTE — Telephone Encounter (Signed)
 Spoke with pt to advise to contact pcp. Pt agreeable.

## 2024-05-18 NOTE — Telephone Encounter (Signed)
 Callers name: self Relation to patient:   Call back phone #:   Pharmacy (if applicable):   Issue/reason for call: pt called stating that she has been dealing with on going rheumatoid arthritis in both hands that she is being treated for. She states she has had an increase in the cramps in her hands and now they are in her legs too. She is concerned that the new cramps may be related to her diuretic. Last labs viewed through care everywhere on 04/21/24. Cmet normal. Advice?

## 2024-06-30 ENCOUNTER — Telehealth (HOSPITAL_COMMUNITY): Payer: Self-pay

## 2024-06-30 MED ORDER — PANTOPRAZOLE SODIUM 40 MG PO TBEC: 40.0000 mg | DELAYED_RELEASE_TABLET | Freq: Every day | ORAL | 0 refills | Status: AC

## 2024-06-30 NOTE — Telephone Encounter (Signed)
 Refill sent to pharmacy.

## 2024-07-30 ENCOUNTER — Ambulatory Visit: Admitting: Pulmonary Disease
